# Patient Record
Sex: Female | Born: 1940 | Race: White | Hispanic: No | Marital: Married | Smoking: Former smoker
Health system: Southern US, Community
[De-identification: ages and names within clinical notes are randomized; demographics above are authoritative.]

## PROBLEM LIST (undated history)

## (undated) DIAGNOSIS — E039 Hypothyroidism, unspecified: Secondary | ICD-10-CM

## (undated) DIAGNOSIS — C3490 Malignant neoplasm of unspecified part of unspecified bronchus or lung: Secondary | ICD-10-CM

## (undated) DIAGNOSIS — G4733 Obstructive sleep apnea (adult) (pediatric): Secondary | ICD-10-CM

## (undated) DIAGNOSIS — J449 Chronic obstructive pulmonary disease, unspecified: Secondary | ICD-10-CM

## (undated) DIAGNOSIS — I10 Essential (primary) hypertension: Secondary | ICD-10-CM

## (undated) DIAGNOSIS — I6523 Occlusion and stenosis of bilateral carotid arteries: Secondary | ICD-10-CM

## (undated) DIAGNOSIS — K219 Gastro-esophageal reflux disease without esophagitis: Secondary | ICD-10-CM

## (undated) HISTORY — PX: TOTAL ABDOMINAL HYSTERECTOMY W/ BILATERAL SALPINGOOPHORECTOMY: SHX83

## (undated) HISTORY — PX: APPENDECTOMY: SHX54

## (undated) HISTORY — PX: LUNG LOBECTOMY: SHX167

## (undated) HISTORY — PX: TRANSVERSE LOOP COLOSTOMY: SHX6478

---

## 2010-02-17 ENCOUNTER — Inpatient Hospital Stay (HOSPITAL_COMMUNITY): Admission: AD | Admit: 2010-02-17 | Discharge: 2010-02-19 | Payer: Self-pay | Admitting: Orthopedic Surgery

## 2010-12-05 LAB — DIFFERENTIAL
Eosinophils Absolute: 0.2 10*3/uL (ref 0.0–0.7)
Monocytes Absolute: 0.6 10*3/uL (ref 0.1–1.0)
Monocytes Relative: 8 % (ref 3–12)
Neutrophils Relative %: 62 % (ref 43–77)

## 2010-12-05 LAB — COMPREHENSIVE METABOLIC PANEL
Alkaline Phosphatase: 52 U/L (ref 39–117)
BUN: 26 mg/dL — ABNORMAL HIGH (ref 6–23)
Chloride: 102 mEq/L (ref 96–112)
GFR calc Af Amer: 38 mL/min — ABNORMAL LOW (ref >60)
Potassium: 4.2 mEq/L (ref 3.5–5.1)
Sodium: 140 mEq/L (ref 135–145)
Total Bilirubin: 0.6 mg/dL (ref 0.3–1.2)
Total Protein: 6.1 g/dL (ref 6.0–8.3)

## 2010-12-05 LAB — CBC
MCHC: 33.8 g/dL (ref 30.0–36.0)
RDW: 12.9 % (ref 11.5–15.5)

## 2016-04-04 HISTORY — PX: COLOSTOMY TAKEDOWN: SHX5783

## 2016-04-09 ENCOUNTER — Inpatient Hospital Stay (HOSPITAL_COMMUNITY)
Admission: EM | Admit: 2016-04-09 | Discharge: 2016-05-19 | DRG: 003 | Disposition: E | Payer: Medicare Other | Source: Other Acute Inpatient Hospital | Attending: Internal Medicine | Admitting: Internal Medicine

## 2016-04-09 ENCOUNTER — Inpatient Hospital Stay (HOSPITAL_COMMUNITY): Payer: Medicare Other

## 2016-04-09 ENCOUNTER — Encounter (HOSPITAL_COMMUNITY): Payer: Self-pay | Admitting: Acute Care

## 2016-04-09 DIAGNOSIS — J189 Pneumonia, unspecified organism: Secondary | ICD-10-CM

## 2016-04-09 DIAGNOSIS — J440 Chronic obstructive pulmonary disease with acute lower respiratory infection: Secondary | ICD-10-CM | POA: Diagnosis present

## 2016-04-09 DIAGNOSIS — Z6823 Body mass index (BMI) 23.0-23.9, adult: Secondary | ICD-10-CM

## 2016-04-09 DIAGNOSIS — I639 Cerebral infarction, unspecified: Secondary | ICD-10-CM | POA: Diagnosis not present

## 2016-04-09 DIAGNOSIS — I6300 Cerebral infarction due to thrombosis of unspecified precerebral artery: Secondary | ICD-10-CM

## 2016-04-09 DIAGNOSIS — Z902 Acquired absence of lung [part of]: Secondary | ICD-10-CM

## 2016-04-09 DIAGNOSIS — K567 Ileus, unspecified: Secondary | ICD-10-CM | POA: Diagnosis present

## 2016-04-09 DIAGNOSIS — N170 Acute kidney failure with tubular necrosis: Secondary | ICD-10-CM | POA: Diagnosis not present

## 2016-04-09 DIAGNOSIS — D65 Disseminated intravascular coagulation [defibrination syndrome]: Secondary | ICD-10-CM | POA: Diagnosis not present

## 2016-04-09 DIAGNOSIS — R6521 Severe sepsis with septic shock: Secondary | ICD-10-CM | POA: Diagnosis present

## 2016-04-09 DIAGNOSIS — R531 Weakness: Secondary | ICD-10-CM | POA: Diagnosis not present

## 2016-04-09 DIAGNOSIS — J9600 Acute respiratory failure, unspecified whether with hypoxia or hypercapnia: Secondary | ICD-10-CM

## 2016-04-09 DIAGNOSIS — Z9981 Dependence on supplemental oxygen: Secondary | ICD-10-CM

## 2016-04-09 DIAGNOSIS — I6523 Occlusion and stenosis of bilateral carotid arteries: Secondary | ICD-10-CM | POA: Diagnosis not present

## 2016-04-09 DIAGNOSIS — R571 Hypovolemic shock: Secondary | ICD-10-CM | POA: Diagnosis present

## 2016-04-09 DIAGNOSIS — G9341 Metabolic encephalopathy: Secondary | ICD-10-CM | POA: Diagnosis present

## 2016-04-09 DIAGNOSIS — D72829 Elevated white blood cell count, unspecified: Secondary | ICD-10-CM | POA: Diagnosis not present

## 2016-04-09 DIAGNOSIS — J9690 Respiratory failure, unspecified, unspecified whether with hypoxia or hypercapnia: Secondary | ICD-10-CM

## 2016-04-09 DIAGNOSIS — T814XXA Infection following a procedure, initial encounter: Secondary | ICD-10-CM | POA: Diagnosis not present

## 2016-04-09 DIAGNOSIS — A419 Sepsis, unspecified organism: Principal | ICD-10-CM | POA: Diagnosis present

## 2016-04-09 DIAGNOSIS — I480 Paroxysmal atrial fibrillation: Secondary | ICD-10-CM | POA: Diagnosis present

## 2016-04-09 DIAGNOSIS — K219 Gastro-esophageal reflux disease without esophagitis: Secondary | ICD-10-CM | POA: Diagnosis present

## 2016-04-09 DIAGNOSIS — E43 Unspecified severe protein-calorie malnutrition: Secondary | ICD-10-CM | POA: Diagnosis present

## 2016-04-09 DIAGNOSIS — Z66 Do not resuscitate: Secondary | ICD-10-CM | POA: Diagnosis not present

## 2016-04-09 DIAGNOSIS — R7989 Other specified abnormal findings of blood chemistry: Secondary | ICD-10-CM

## 2016-04-09 DIAGNOSIS — K7200 Acute and subacute hepatic failure without coma: Secondary | ICD-10-CM | POA: Diagnosis not present

## 2016-04-09 DIAGNOSIS — Y836 Removal of other organ (partial) (total) as the cause of abnormal reaction of the patient, or of later complication, without mention of misadventure at the time of the procedure: Secondary | ICD-10-CM | POA: Diagnosis not present

## 2016-04-09 DIAGNOSIS — T380X5A Adverse effect of glucocorticoids and synthetic analogues, initial encounter: Secondary | ICD-10-CM | POA: Diagnosis not present

## 2016-04-09 DIAGNOSIS — J9601 Acute respiratory failure with hypoxia: Secondary | ICD-10-CM | POA: Diagnosis not present

## 2016-04-09 DIAGNOSIS — E878 Other disorders of electrolyte and fluid balance, not elsewhere classified: Secondary | ICD-10-CM | POA: Diagnosis not present

## 2016-04-09 DIAGNOSIS — D6959 Other secondary thrombocytopenia: Secondary | ICD-10-CM | POA: Diagnosis not present

## 2016-04-09 DIAGNOSIS — E872 Acidosis: Secondary | ICD-10-CM | POA: Diagnosis present

## 2016-04-09 DIAGNOSIS — B370 Candidal stomatitis: Secondary | ICD-10-CM | POA: Diagnosis not present

## 2016-04-09 DIAGNOSIS — G9340 Encephalopathy, unspecified: Secondary | ICD-10-CM | POA: Diagnosis present

## 2016-04-09 DIAGNOSIS — Y95 Nosocomial condition: Secondary | ICD-10-CM | POA: Diagnosis present

## 2016-04-09 DIAGNOSIS — D62 Acute posthemorrhagic anemia: Secondary | ICD-10-CM | POA: Diagnosis not present

## 2016-04-09 DIAGNOSIS — R1310 Dysphagia, unspecified: Secondary | ICD-10-CM | POA: Diagnosis present

## 2016-04-09 DIAGNOSIS — Z85118 Personal history of other malignant neoplasm of bronchus and lung: Secondary | ICD-10-CM

## 2016-04-09 DIAGNOSIS — E039 Hypothyroidism, unspecified: Secondary | ICD-10-CM | POA: Diagnosis present

## 2016-04-09 DIAGNOSIS — K9189 Other postprocedural complications and disorders of digestive system: Secondary | ICD-10-CM

## 2016-04-09 DIAGNOSIS — I4891 Unspecified atrial fibrillation: Secondary | ICD-10-CM | POA: Diagnosis not present

## 2016-04-09 DIAGNOSIS — I10 Essential (primary) hypertension: Secondary | ICD-10-CM | POA: Diagnosis present

## 2016-04-09 DIAGNOSIS — R1013 Epigastric pain: Secondary | ICD-10-CM | POA: Diagnosis not present

## 2016-04-09 DIAGNOSIS — Z452 Encounter for adjustment and management of vascular access device: Secondary | ICD-10-CM

## 2016-04-09 DIAGNOSIS — K55029 Acute infarction of small intestine, extent unspecified: Secondary | ICD-10-CM | POA: Diagnosis not present

## 2016-04-09 DIAGNOSIS — R4182 Altered mental status, unspecified: Secondary | ICD-10-CM | POA: Diagnosis present

## 2016-04-09 DIAGNOSIS — I6340 Cerebral infarction due to embolism of unspecified cerebral artery: Secondary | ICD-10-CM | POA: Diagnosis present

## 2016-04-09 DIAGNOSIS — IMO0002 Reserved for concepts with insufficient information to code with codable children: Secondary | ICD-10-CM

## 2016-04-09 DIAGNOSIS — Z515 Encounter for palliative care: Secondary | ICD-10-CM | POA: Diagnosis not present

## 2016-04-09 DIAGNOSIS — Z419 Encounter for procedure for purposes other than remedying health state, unspecified: Secondary | ICD-10-CM

## 2016-04-09 DIAGNOSIS — K559 Vascular disorder of intestine, unspecified: Secondary | ICD-10-CM | POA: Diagnosis present

## 2016-04-09 DIAGNOSIS — R945 Abnormal results of liver function studies: Secondary | ICD-10-CM

## 2016-04-09 DIAGNOSIS — I6529 Occlusion and stenosis of unspecified carotid artery: Secondary | ICD-10-CM | POA: Diagnosis present

## 2016-04-09 DIAGNOSIS — Z8673 Personal history of transient ischemic attack (TIA), and cerebral infarction without residual deficits: Secondary | ICD-10-CM

## 2016-04-09 DIAGNOSIS — E162 Hypoglycemia, unspecified: Secondary | ICD-10-CM | POA: Diagnosis not present

## 2016-04-09 DIAGNOSIS — J15212 Pneumonia due to Methicillin resistant Staphylococcus aureus: Secondary | ICD-10-CM | POA: Diagnosis present

## 2016-04-09 DIAGNOSIS — R000 Tachycardia, unspecified: Secondary | ICD-10-CM | POA: Diagnosis present

## 2016-04-09 DIAGNOSIS — Z79899 Other long term (current) drug therapy: Secondary | ICD-10-CM

## 2016-04-09 DIAGNOSIS — R109 Unspecified abdominal pain: Secondary | ICD-10-CM

## 2016-04-09 DIAGNOSIS — I248 Other forms of acute ischemic heart disease: Secondary | ICD-10-CM | POA: Diagnosis not present

## 2016-04-09 DIAGNOSIS — I69391 Dysphagia following cerebral infarction: Secondary | ICD-10-CM

## 2016-04-09 DIAGNOSIS — J9602 Acute respiratory failure with hypercapnia: Secondary | ICD-10-CM | POA: Diagnosis not present

## 2016-04-09 DIAGNOSIS — R17 Unspecified jaundice: Secondary | ICD-10-CM

## 2016-04-09 DIAGNOSIS — I63133 Cerebral infarction due to embolism of bilateral carotid arteries: Secondary | ICD-10-CM | POA: Diagnosis present

## 2016-04-09 DIAGNOSIS — E876 Hypokalemia: Secondary | ICD-10-CM | POA: Diagnosis not present

## 2016-04-09 DIAGNOSIS — J90 Pleural effusion, not elsewhere classified: Secondary | ICD-10-CM | POA: Diagnosis present

## 2016-04-09 DIAGNOSIS — K660 Peritoneal adhesions (postprocedural) (postinfection): Secondary | ICD-10-CM | POA: Diagnosis present

## 2016-04-09 DIAGNOSIS — J9620 Acute and chronic respiratory failure, unspecified whether with hypoxia or hypercapnia: Secondary | ICD-10-CM | POA: Diagnosis not present

## 2016-04-09 DIAGNOSIS — Z7982 Long term (current) use of aspirin: Secondary | ICD-10-CM

## 2016-04-09 DIAGNOSIS — K913 Postprocedural intestinal obstruction: Secondary | ICD-10-CM | POA: Diagnosis not present

## 2016-04-09 DIAGNOSIS — G4733 Obstructive sleep apnea (adult) (pediatric): Secondary | ICD-10-CM | POA: Diagnosis present

## 2016-04-09 DIAGNOSIS — K55059 Acute (reversible) ischemia of intestine, part and extent unspecified: Secondary | ICD-10-CM | POA: Diagnosis not present

## 2016-04-09 DIAGNOSIS — D6489 Other specified anemias: Secondary | ICD-10-CM | POA: Diagnosis present

## 2016-04-09 DIAGNOSIS — J449 Chronic obstructive pulmonary disease, unspecified: Secondary | ICD-10-CM | POA: Diagnosis not present

## 2016-04-09 DIAGNOSIS — J441 Chronic obstructive pulmonary disease with (acute) exacerbation: Secondary | ICD-10-CM | POA: Diagnosis present

## 2016-04-09 DIAGNOSIS — R627 Adult failure to thrive: Secondary | ICD-10-CM | POA: Diagnosis present

## 2016-04-09 DIAGNOSIS — Z87891 Personal history of nicotine dependence: Secondary | ICD-10-CM

## 2016-04-09 HISTORY — DX: Malignant neoplasm of unspecified part of unspecified bronchus or lung: C34.90

## 2016-04-09 HISTORY — DX: Essential (primary) hypertension: I10

## 2016-04-09 HISTORY — DX: Hypothyroidism, unspecified: E03.9

## 2016-04-09 HISTORY — DX: Chronic obstructive pulmonary disease, unspecified: J44.9

## 2016-04-09 HISTORY — DX: Gastro-esophageal reflux disease without esophagitis: K21.9

## 2016-04-09 HISTORY — DX: Obstructive sleep apnea (adult) (pediatric): G47.33

## 2016-04-09 HISTORY — DX: Occlusion and stenosis of bilateral carotid arteries: I65.23

## 2016-04-09 LAB — CBC WITH DIFFERENTIAL/PLATELET
BAND NEUTROPHILS: 23 %
BASOS PCT: 0 %
BLASTS: 0 %
Basophils Absolute: 0 10*3/uL (ref 0.0–0.1)
EOS ABS: 0.2 10*3/uL (ref 0.0–0.7)
Eosinophils Relative: 1 %
HCT: 31.2 % — ABNORMAL LOW (ref 36.0–46.0)
HEMOGLOBIN: 10.2 g/dL — AB (ref 12.0–15.0)
LYMPHS PCT: 4 %
Lymphs Abs: 0.9 10*3/uL (ref 0.7–4.0)
MCH: 29.5 pg (ref 26.0–34.0)
MCHC: 32.7 g/dL (ref 30.0–36.0)
MCV: 90.2 fL (ref 78.0–100.0)
MONOS PCT: 5 %
Metamyelocytes Relative: 3 %
Monocytes Absolute: 1.1 10*3/uL — ABNORMAL HIGH (ref 0.1–1.0)
Myelocytes: 0 %
NEUTROS ABS: 20.1 10*3/uL — AB (ref 1.7–7.7)
NRBC: 0 /100{WBCs}
Neutrophils Relative %: 62 %
OTHER: 0 %
Platelets: 86 10*3/uL — ABNORMAL LOW (ref 150–400)
Promyelocytes Absolute: 2 %
RBC: 3.46 MIL/uL — ABNORMAL LOW (ref 3.87–5.11)
RDW: 14.9 % (ref 11.5–15.5)
WBC Morphology: INCREASED
WBC: 22.3 10*3/uL — ABNORMAL HIGH (ref 4.0–10.5)

## 2016-04-09 LAB — POCT I-STAT 3, ART BLOOD GAS (G3+)
Acid-base deficit: 5 mmol/L — ABNORMAL HIGH (ref 0.0–2.0)
Bicarbonate: 18.3 mEq/L — ABNORMAL LOW (ref 20.0–24.0)
O2 Saturation: 94 %
PCO2 ART: 27.5 mmHg — AB (ref 35.0–45.0)
PH ART: 7.428 (ref 7.350–7.450)
TCO2: 19 mmol/L (ref 0–100)
pO2, Arterial: 64 mmHg — ABNORMAL LOW (ref 80.0–100.0)

## 2016-04-09 LAB — PROTIME-INR
INR: 1.12 (ref 0.00–1.49)
PROTHROMBIN TIME: 14.6 s (ref 11.6–15.2)

## 2016-04-09 LAB — COMPREHENSIVE METABOLIC PANEL
ALBUMIN: 2 g/dL — AB (ref 3.5–5.0)
ALK PHOS: 96 U/L (ref 38–126)
ALT: 36 U/L (ref 14–54)
AST: 50 U/L — AB (ref 15–41)
Anion gap: 6 (ref 5–15)
BILIRUBIN TOTAL: 1.3 mg/dL — AB (ref 0.3–1.2)
BUN: 17 mg/dL (ref 6–20)
CALCIUM: 7.9 mg/dL — AB (ref 8.9–10.3)
CO2: 22 mmol/L (ref 22–32)
Chloride: 111 mmol/L (ref 101–111)
Creatinine, Ser: 1.01 mg/dL — ABNORMAL HIGH (ref 0.44–1.00)
GFR calc Af Amer: >60 mL/min (ref >60)
GFR calc non Af Amer: 52 mL/min — ABNORMAL LOW (ref >60)
GLUCOSE: 95 mg/dL (ref 65–99)
Potassium: 4 mmol/L (ref 3.5–5.1)
Sodium: 139 mmol/L (ref 135–145)
TOTAL PROTEIN: 4.4 g/dL — AB (ref 6.5–8.1)

## 2016-04-09 LAB — GLUCOSE, CAPILLARY
GLUCOSE-CAPILLARY: 104 mg/dL — AB (ref 65–99)
Glucose-Capillary: 76 mg/dL (ref 65–99)

## 2016-04-09 LAB — MAGNESIUM: Magnesium: 1.7 mg/dL (ref 1.7–2.4)

## 2016-04-09 LAB — TROPONIN I
TROPONIN I: 0.63 ng/mL — AB (ref <0.03)
Troponin I: 1.95 ng/mL (ref <0.03)

## 2016-04-09 LAB — PHOSPHORUS: Phosphorus: 1.3 mg/dL — ABNORMAL LOW (ref 2.5–4.6)

## 2016-04-09 LAB — LACTIC ACID, PLASMA
LACTIC ACID, VENOUS: 2.3 mmol/L — AB (ref 0.5–1.9)
LACTIC ACID, VENOUS: 1.6 mmol/L (ref 0.5–1.9)

## 2016-04-09 LAB — MRSA PCR SCREENING: MRSA BY PCR: POSITIVE — AB

## 2016-04-09 LAB — TSH: TSH: 28.195 u[IU]/mL — ABNORMAL HIGH (ref 0.350–4.500)

## 2016-04-09 LAB — CK: Total CK: 47 U/L (ref 38–234)

## 2016-04-09 LAB — PROCALCITONIN: Procalcitonin: 0.95 ng/mL

## 2016-04-09 MED ORDER — FENTANYL CITRATE (PF) 100 MCG/2ML IJ SOLN
50.0000 ug | INTRAMUSCULAR | Status: DC | PRN
  Administered 2016-04-10: 50 ug via INTRAVENOUS
  Filled 2016-04-09 (×2): qty 2

## 2016-04-09 MED ORDER — CHLORHEXIDINE GLUCONATE CLOTH 2 % EX PADS
6.0000 | MEDICATED_PAD | Freq: Every day | CUTANEOUS | Status: DC
  Administered 2016-04-10 – 2016-04-11 (×2): 6 via TOPICAL

## 2016-04-09 MED ORDER — VANCOMYCIN HCL IN DEXTROSE 1-5 GM/200ML-% IV SOLN
1000.0000 mg | Freq: Once | INTRAVENOUS | Status: DC
  Filled 2016-04-09: qty 200

## 2016-04-09 MED ORDER — METHYLPREDNISOLONE SODIUM SUCC 125 MG IJ SOLR
40.0000 mg | Freq: Two times a day (BID) | INTRAMUSCULAR | Status: DC
  Administered 2016-04-09 – 2016-04-11 (×4): 40 mg via INTRAVENOUS
  Filled 2016-04-09 (×4): qty 2

## 2016-04-09 MED ORDER — HEPARIN SODIUM (PORCINE) 5000 UNIT/ML IJ SOLN
5000.0000 [IU] | Freq: Three times a day (TID) | INTRAMUSCULAR | Status: DC
  Administered 2016-04-09 – 2016-04-11 (×7): 5000 [IU] via SUBCUTANEOUS
  Filled 2016-04-09 (×7): qty 1

## 2016-04-09 MED ORDER — MEROPENEM 1 G IV SOLR
1.0000 g | Freq: Once | INTRAVENOUS | Status: AC
  Administered 2016-04-09: 1 g via INTRAVENOUS
  Filled 2016-04-09: qty 1

## 2016-04-09 MED ORDER — INSULIN ASPART 100 UNIT/ML ~~LOC~~ SOLN
0.0000 [IU] | SUBCUTANEOUS | Status: DC
  Administered 2016-04-10: 1 [IU] via SUBCUTANEOUS

## 2016-04-09 MED ORDER — ANTISEPTIC ORAL RINSE SOLUTION (CORINZ)
7.0000 mL | Freq: Four times a day (QID) | OROMUCOSAL | Status: DC
  Administered 2016-04-09 – 2016-04-12 (×11): 7 mL via OROMUCOSAL

## 2016-04-09 MED ORDER — METOPROLOL TARTRATE 5 MG/5ML IV SOLN
2.5000 mg | INTRAVENOUS | Status: DC
  Administered 2016-04-09 – 2016-04-10 (×4): 2.5 mg via INTRAVENOUS
  Filled 2016-04-09 (×5): qty 5

## 2016-04-09 MED ORDER — SODIUM CHLORIDE 0.9% FLUSH: 10.0000 mL | INTRAVENOUS | Status: DC | PRN

## 2016-04-09 MED ORDER — CHLORHEXIDINE GLUCONATE 0.12% ORAL RINSE (MEDLINE KIT)
15.0000 mL | Freq: Two times a day (BID) | OROMUCOSAL | Status: DC
  Administered 2016-04-09 – 2016-04-11 (×4): 15 mL via OROMUCOSAL

## 2016-04-09 MED ORDER — DEXTROSE-NACL 5-0.45 % IV SOLN
INTRAVENOUS | Status: DC
  Administered 2016-04-10: 02:00:00 via INTRAVENOUS

## 2016-04-09 MED ORDER — IPRATROPIUM-ALBUTEROL 0.5-2.5 (3) MG/3ML IN SOLN
3.0000 mL | Freq: Four times a day (QID) | RESPIRATORY_TRACT | Status: DC
  Administered 2016-04-09 – 2016-04-13 (×15): 3 mL via RESPIRATORY_TRACT
  Filled 2016-04-09 (×17): qty 3

## 2016-04-09 MED ORDER — BUDESONIDE 0.5 MG/2ML IN SUSP
0.5000 mg | Freq: Two times a day (BID) | RESPIRATORY_TRACT | Status: DC
  Administered 2016-04-09 – 2016-04-29 (×39): 0.5 mg via RESPIRATORY_TRACT
  Filled 2016-04-09 (×40): qty 2

## 2016-04-09 MED ORDER — SODIUM CHLORIDE 0.9 % IV SOLN
1.0000 g | Freq: Two times a day (BID) | INTRAVENOUS | Status: DC
  Administered 2016-04-10 – 2016-04-12 (×5): 1 g via INTRAVENOUS
  Filled 2016-04-09 (×6): qty 1

## 2016-04-09 MED ORDER — FAMOTIDINE IN NACL 20-0.9 MG/50ML-% IV SOLN
20.0000 mg | Freq: Two times a day (BID) | INTRAVENOUS | Status: DC
  Administered 2016-04-09 – 2016-04-12 (×6): 20 mg via INTRAVENOUS
  Filled 2016-04-09 (×7): qty 50

## 2016-04-09 MED ORDER — SODIUM CHLORIDE 0.9% FLUSH
10.0000 mL | Freq: Two times a day (BID) | INTRAVENOUS | Status: DC
  Administered 2016-04-09 – 2016-04-14 (×9): 10 mL
  Administered 2016-04-14 – 2016-04-15 (×2): 40 mL
  Administered 2016-04-16: 30 mL

## 2016-04-09 MED ORDER — MUPIROCIN 2 % EX OINT
1.0000 "application " | TOPICAL_OINTMENT | Freq: Two times a day (BID) | CUTANEOUS | Status: AC
  Administered 2016-04-09 – 2016-04-14 (×10): 1 via NASAL
  Filled 2016-04-09 (×2): qty 22

## 2016-04-09 MED ORDER — DILTIAZEM HCL 100 MG IV SOLR
5.0000 mg/h | INTRAVENOUS | Status: DC
  Administered 2016-04-09: 5 mg/h via INTRAVENOUS
  Administered 2016-04-09 – 2016-04-10 (×2): 15 mg/h via INTRAVENOUS
  Administered 2016-04-10 – 2016-04-11 (×3): 10 mg/h via INTRAVENOUS
  Filled 2016-04-09 (×6): qty 100

## 2016-04-09 MED ORDER — FENTANYL CITRATE (PF) 100 MCG/2ML IJ SOLN
50.0000 ug | INTRAMUSCULAR | Status: DC | PRN
  Administered 2016-04-10: 50 ug via INTRAVENOUS

## 2016-04-09 MED ORDER — ASPIRIN 81 MG PO CHEW: 324.0000 mg | CHEWABLE_TABLET | ORAL | Status: AC

## 2016-04-09 MED ORDER — DILTIAZEM LOAD VIA INFUSION
10.0000 mg | Freq: Once | INTRAVENOUS | Status: AC
  Administered 2016-04-09: 10 mg via INTRAVENOUS
  Filled 2016-04-09: qty 10

## 2016-04-09 MED ORDER — ASPIRIN 300 MG RE SUPP: 300.0000 mg | RECTAL | Status: AC

## 2016-04-09 MED ORDER — VANCOMYCIN HCL 500 MG IV SOLR
500.0000 mg | Freq: Two times a day (BID) | INTRAVENOUS | Status: DC
  Administered 2016-04-10 – 2016-04-12 (×6): 500 mg via INTRAVENOUS
  Filled 2016-04-09 (×8): qty 500

## 2016-04-09 MED ORDER — LEVOTHYROXINE SODIUM 100 MCG IV SOLR
44.0000 ug | Freq: Every day | INTRAVENOUS | Status: DC
  Administered 2016-04-10 – 2016-04-12 (×3): 44 ug via INTRAVENOUS
  Filled 2016-04-09 (×4): qty 5

## 2016-04-09 MED ORDER — VANCOMYCIN HCL 10 G IV SOLR
1250.0000 mg | Freq: Once | INTRAVENOUS | Status: AC
  Administered 2016-04-09: 1250 mg via INTRAVENOUS
  Filled 2016-04-09: qty 1250

## 2016-04-09 NOTE — Progress Notes (Signed)
eLink Physician-Brief Progress Note Patient Name: Kennisha Qin DOB: [birth year] MRN: [id number]   Date of Service  03/22/2016  HPI/Events of Note  Rapid af not resp to lopressor   Intake/Output Summary (Last 24 hours) at 04/04/2016 1759 Last data filed at 04/12/2016 1700  Gross per 24 hour  Intake              250 ml  Output             1055 ml  Net             -805 ml     eICU Interventions  cardizem drip ordered     Intervention Category Major Interventions: Arrhythmia - evaluation and management  Christinia Gully 04/14/2016, 5:59 PM

## 2016-04-09 NOTE — H&P (Signed)
PULMONARY / CRITICAL CARE MEDICINE   Name: Amy [name] MRN: [id number] DOB: 1941/01/21    ADMISSION DATE:  04/06/2016 CONSULTATION DATE:  7/23  REFERRING MD:  [facility] hospital per family request   CHIEF COMPLAINT:  Acute encephalopathy and progressive respiratory failure   HISTORY OF PRESENT ILLNESS:   This is a 75 year old white female who was initially admitted to [facility] hospital where she underwent an elective loop colostomy takedown on 7/18. Her immediate post-op course was c/b lethargy and actually required narcan in PACU. She never really improved w/ hospital notes continuing to raise concern for altered mental status and difficulty to arouse. She ultimately required emergent intubation for hypercarbia (CO2 52) and inability to protect airway. She was transferred per family request on 7/23 for further evaluation and care.   PAST MEDICAL HISTORY :  She  has a past medical history of Bilateral carotid artery stenosis; COPD (chronic obstructive pulmonary disease) ([place]); GERD (gastroesophageal reflux disease); HTN (hypertension); Hypothyroidism; Non-small cell lung cancer ([place]); and OSA (obstructive sleep apnea).  PAST SURGICAL HISTORY: She  has a past surgical history that includes Appendectomy; Total abdominal hysterectomy w/ bilateral salpingoophorectomy; Lung lobectomy (Right); Colostomy takedown (04/04/2016); and Transverse loop colostomy.  Allergies  Allergen Reactions  . Ace Inhibitors Cough  . Codeine Nausea Only  . Morphine And Related   . Penicillins Itching  . Prednisone Other (See Comments)    Head aches   . Spiriva Handihaler [Tiotropium Bromide Monohydrate] Other (See Comments)    Urinary retention     No current facility-administered medications on file prior to encounter.    No current outpatient prescriptions on file prior to encounter.    FAMILY HISTORY:  Her family has h/o cardiac disease (both sides); also brother w/ prostate CA copd   SOCIAL  HISTORY: She   is marries. Stopped smoking about 20 yrs ago  REVIEW OF SYSTEMS:   Unable   SUBJECTIVE:  Sedated on vent   VITAL SIGNS: BP (!) 133/59   Pulse 62   Temp 97.5 F (36.4 C) (Oral)   Resp (!) 24   Ht '5\' 6"'$  (1.676 m)   Wt 141 lb 5 oz (64.1 kg)   SpO2 98%   BMI 22.81 kg/m   HEMODYNAMICS:    VENTILATOR SETTINGS: Vent Mode: PRVC FiO2 (%):  [50 %] 50 % Set Rate:  [20 bmp] 20 bmp Vt Set:  [450 mL] 450 mL PEEP:  [5 cmH20] 5 cmH20 Plateau Pressure:  [28 cmH20] 28 cmH20  INTAKE / OUTPUT: No intake/output data recorded.  PHYSICAL EXAMINATION: General:  Elderly 75 year old female. Lethargic, opens eyes to voice, no spont purposeful movement.  Neuro:  Opens eyes to verbal stimuli only, does not follow commands. [place] localizes w the right but seems to neglect the left and is weaker on the left  HEENT:  MMM, orally intubated  Cardiovascular:  Tachy irreg irreg  Lungs:  Diffuse rhonchi Decreased on right  Abdomen:  Soft, tender to palp. Staples intact, LLQ dressing intact  Musculoskeletal:  Good strength on right. Decreased on left.  Skin:  Warm and dry   LABS:  BMET No results for input(s): NA, K, CL, CO2, BUN, CREATININE, GLUCOSE in the last 168 hours.  Electrolytes No results for input(s): CALCIUM, MG, PHOS in the last 168 hours.  CBC No results for input(s): WBC, HGB, HCT, PLT in the last 168 hours.  Coag's No results for input(s): APTT, INR in the last 168 hours.  Sepsis  Markers No results for input(s): LATICACIDVEN, PROCALCITON, O2SATVEN in the last 168 hours.  ABG  Recent Labs Lab 03/25/2016 1432  PHART 7.428  PCO2ART 27.5*  PO2ART 64.0*    Liver Enzymes No results for input(s): AST, ALT, ALKPHOS, BILITOT, ALBUMIN in the last 168 hours.  Cardiac Enzymes No results for input(s): TROPONINI, PROBNP in the last 168 hours.  Glucose  Recent Labs Lab 04/14/2016 1432  GLUCAP 104*    Imaging Portable Chest Xray  Result Date:  03/23/2016 CLINICAL DATA:  Patient with acute respiratory failure. EXAM: PORTABLE CHEST 1 VIEW COMPARISON:  Chest radiograph 03/29/2016. FINDINGS: ET tube terminates in the mid trachea. Right IJ central venous catheter tip projects over the superior vena cava. Enteric tube courses inferior to the diaphragm. Stable cardiac and mediastinal contours. Rightward shift of mediastinum. Moderate right pleural effusion with pulmonary consolidation within the right mid and lower hemi thorax. Left lung is clear. Monitoring leads overlie the patient. IMPRESSION: Persistent moderate right pleural effusion and underlying opacities favored to represent atelectasis. ET tube terminates in mid trachea. Electronically Signed   By: Lovey Newcomer M.D.   On: 03/20/2016 15:19    STUDIES:  [facility] head (at [facility] negative  MR brain  7/23>>> EEG 7/23>>>  CULTURES: Sputum 7/23>> UC 7/23>>> BC 7/23>>  ANTIBIOTICS: vanc 7/23>>> meropenem 7/23>>>  SIGNIFICANT EVENTS: Loop colostomy takedown 7/18 7/18-7/22: progressively lethargic  7/23 transferred to [facility] s/p getting intubated for AMS and hypercarbia. Surgical service consulted at [facility] on arrival   LINES/TUBES: OETT 7/23>>>  DISCUSSION: 75 year old white female who was initially admitted to [facility] hospital where she underwent an elective loop colostomy takedown on 7/18. Her immediate post-op course was c/b lethargy and actually required narcan in PACU. She never really improved w/ hospital notes continuing to raise concern for altered mental status and difficulty to arouse. She ultimately required emergent intubation for hypercarbia (CO2 52) and inability to protect airway. She was transferred per family request on 7/23 for further evaluation and care. The etiology of her encephalopathy is unclear. We will obtain admission labs, have ordered MRI head and also EEG. Have also asked Neuro to see. Surgical services have been consulted to a/w wound care and nutritional  needs.     ASSESSMENT / PLAN:  PULMONARY A: Acute Hypercarbic respiratory failure (PCO2 52) H/o COPD w/ probable AECOPD H/o OSA (intolerant of CPAP_ H/o NSCLC w/ RUL lobectomy --> per records in remission  P:   Full vent support PAD protocol  No benzos Scheduled BDs Scheduled systemic steroids  Daily assessment for weaning   CARDIOVASCULAR A:  AF HTN  H/o bilateral carotid artery stenosis  P:  Scheduled lopressor  Get 12 lead  Tele monitoring  Will hold off from anticoagulation given bleed risk   RENAL A:   Metabolic acidosis   P:   Ck lactic acid  Ck chemistry   GASTROINTESTINAL A:   Post-op day 5 colostomy take-down (w/ remote h/o diverticular disease) Post-op ileus  P:   NPO Surgery following-->will decide on tubefeeds vs TNA next day or so Pepcid for SUP  HEMATOLOGIC A:   Leukocytosis  Anemia of critical illness P:  Last hgb on record from OSH is 9.5-->will repeat CBC [place] heparin    INFECTIOUS A:   R/o sepsis P:   Pan culture Will cont meropenem and vanc for now as initiated at [facility]   ENDOCRINE A:   Hypothyroidism  P:   Synthroid replacement  SSI protocol   NEUROLOGIC  A:   Acute Encephalopathy: etiology unclear. She does have some weakness from a neuro-standpoint focally on the left. Could all be residual sedation, at this point hypercarbia unlikely as should have improved by now, her initial CT head was negative but need to ensure she has not had an acute neurological insult. Also r/o sepsis  P:   RASS goal: 0 to -1 PAD protocol EEG Get MRI brain  May need   FAMILY  - Updates: pending   - Inter-disciplinary family meet or Palliative Care meeting due by: [place] ACNP-BC [place] Pager # [phone] OR # [phone] if no answer   03/25/2016, 3:34 PM

## 2016-04-09 NOTE — Consult Note (Signed)
Reason for Consult: post-op care Referring Physician: Clista Rainford [name] is an 75 y.o. female.  HPI: Amy [name] is 5 days status post colostomy takedown by Dr. Flonnie Overman at [facility] in [place], [place]. She developed worsening respiratory failure requiring intubation. She was accepted in transfer to [facility] by Dr. Elsworth Soho from the critical care medicine service. By report, she had not been started on a diet yet as bowel function had not returned. She was on TNA there. She has not been requiring any pressors. On arrival, she is intubated so she cannot contribute to the history. It does appear she is in atrial fibrillation with heart rate in the 90s at this time.  No past medical history on file.  No past surgical history on file.  No family history on file.  Social History:  has no tobacco, alcohol, and drug history on file.  Allergies: Allergies not on file  Medications:  Prior to Admission:  No prescriptions prior to admission.    No results found for this or any previous visit (from the past 48 hour(s)).  No results found.  Review of Systems  Unable to perform ROS: Intubated   SpO2 93 %. Physical Exam  Constitutional: She appears well-developed and well-nourished. She appears lethargic. No distress.  HENT:  Head: Normocephalic.  Right Ear: External ear normal.  Left Ear: External ear normal.  Nose: Nose normal.  Mouth/Throat: Oropharynx is clear and moist.  Eyes: Conjunctivae are normal. Pupils are equal, round, and reactive to light. Right eye exhibits no discharge. Left eye exhibits no discharge.  Neck: Neck supple. No tracheal deviation present.  Cardiovascular:  Irregularly irregular rhythm, 90s, distal pulses palpable  Respiratory: Effort normal. No stridor. No respiratory distress. She has no wheezes. She has no rales.  Mild bilateral rhonchi, on ventilator  GI: Soft. She exhibits distension. There is tenderness. There is no rebound and no  guarding.  Soft, mild distention, quiet, anticipated tenderness along incisions, midline incision clean dry and intact, left previous ostomy site incision clean and dry with a wick in place, no cellulitis  Musculoskeletal: She exhibits no tenderness or deformity.  Neurological: She appears lethargic. She displays no atrophy and no tremor. She exhibits normal muscle tone. She displays no seizure activity.  Arouses somewhat but does not follow commands  Skin: Skin is warm.    Assessment/Plan: POD#5 status post colostomy takedown at outside facility as above - await bowel function return prior to starting tube feeds. Leave midline incision open to air. Small plain gauze dressing to previous colostomy site. Plan to remove wick tomorrow. We will follow.  Ventilator dependent respiratory failure and atrial fibrillation - per CCM  Kym Scannell E 03/26/2016, 2:16 PM

## 2016-04-09 NOTE — Consult Note (Signed)
NEURO HOSPITALIST CONSULT NOTE   Requestig physician: Dr. Elsworth Soho   Reason for Consult:Mental status changes   History obtained from:  Chart  HPI:                                                                                                                                          Amy [name] is an 75 y.o. female who was transferred from outside facility due to respiratory failure and mental status changes.  She is s/p 5 days ago of colostomy reversal.  CXR shows right lung consolidation and pleural effusion.  WBC is 22K.  CT Brain was reviewed personally and is normal.  CMP is pending.  Past Medical History:  Diagnosis Date  . Bilateral carotid artery stenosis   . COPD (chronic obstructive pulmonary disease) ([place])   . GERD (gastroesophageal reflux disease)   . HTN (hypertension)   . Hypothyroidism   . Non-small cell lung cancer ([place])   . OSA (obstructive sleep apnea)    intolerant of CPAP    Past Surgical History:  Procedure Laterality Date  . APPENDECTOMY     at age 64  . COLOSTOMY TAKEDOWN  04/04/2016  . LUNG LOBECTOMY Right    RUL 1997 NSCLC  . TOTAL ABDOMINAL HYSTERECTOMY W/ BILATERAL SALPINGOOPHORECTOMY    . TRANSVERSE LOOP COLOSTOMY     w/ drainage of abscess for perf diverticular disease 11/2015    No family history on file.  Social History:  has no tobacco, alcohol, and drug history on file.  Allergies  Allergen Reactions  . Ace Inhibitors Cough  . Codeine Nausea Only  . Morphine And Related     Went crazy  . Penicillins Itching    No other information available at this time 04/07/2016  . Prednisone Other (See Comments)    Head aches   . Spiriva Handihaler [Tiotropium Bromide Monohydrate] Other (See Comments)    Urinary retention     MEDICATIONS:                                                                                                                     Scheduled: . antiseptic oral rinse  7 mL Mouth Rinse QID  . aspirin   324 mg Oral NOW   Or  . aspirin  300 mg Rectal NOW  . budesonide (PULMICORT) nebulizer solution  0.5 mg Nebulization BID  . chlorhexidine gluconate (SAGE KIT)  15 mL Mouth Rinse BID  . famotidine (PEPCID) IV  20 mg Intravenous Q12H  . heparin  5,000 Units Subcutaneous Q8H  . insulin aspart  0-9 Units Subcutaneous Q4H  . ipratropium-albuterol  3 mL Nebulization Q6H  . levothyroxine  44 mcg Intravenous Daily  . methylPREDNISolone (SOLU-MEDROL) injection  40 mg Intravenous Q12H  . metoprolol  2.5 mg Intravenous Q4H     ROS:                                                                                                                                       History obtained from chart  General ROS: negative for - chills, fatigue, fever, night sweats, weight gain or weight loss Psychological ROS: negative for - behavioral disorder, hallucinations, memory difficulties, mood swings or suicidal ideation Ophthalmic ROS: negative for - blurry vision, double vision, eye pain or loss of vision ENT ROS: negative for - epistaxis, nasal discharge, oral lesions, sore throat, tinnitus or vertigo Allergy and Immunology ROS: negative for - hives or itchy/watery eyes Hematological and Lymphatic ROS: negative for - bleeding problems, bruising or swollen lymph nodes Endocrine ROS: negative for - galactorrhea, hair pattern changes, polydipsia/polyuria or temperature intolerance Respiratory ROS: negative for - cough, hemoptysis, shortness of breath or wheezing Cardiovascular ROS: negative for - chest pain, dyspnea on exertion, edema or irregular heartbeat Gastrointestinal ROS: negative for - abdominal pain, diarrhea, hematemesis, nausea/vomiting or stool incontinence Genito-Urinary ROS: negative for - dysuria, hematuria, incontinence or urinary frequency/urgency Musculoskeletal ROS: negative for - joint swelling or muscular weakness Neurological ROS: as noted in HPI Dermatological ROS: negative for rash and  skin lesion changes   Blood pressure 134/83, pulse (!) 38, temperature 97.5 F (36.4 C), temperature source Oral, resp. rate (!) 23, height 5' 6"  (1.676 m), weight 64.1 kg (141 lb 5 oz), SpO2 97 %.   Neurologic Examination:                                                                                                      Intubated, but awake and relatively alert.  Opens eyes and makes good eye contact.  Follows all commands.  Squeezes both hands and lifts off bed.  Bilateral lower extremities normal in strength.    No babinski.  No hoffman's.    Extraocular movements are intact.  Pupils reactive.  Lab Results: Basic Metabolic Panel: No results for input(s): NA, K, CL, CO2, GLUCOSE, BUN, CREATININE, CALCIUM, MG, PHOS in the last 168 hours.  Liver Function Tests: No results for input(s): AST, ALT, ALKPHOS, BILITOT, PROT, ALBUMIN in the last 168 hours. No results for input(s): LIPASE, AMYLASE in the last 168 hours. No results for input(s): AMMONIA in the last 168 hours.  CBC:  Recent Labs Lab 03/27/2016 1515  WBC 22.3*  NEUTROABS PENDING  HGB 10.2*  HCT 31.2*  MCV 90.2  PLT PENDING    Cardiac Enzymes: No results for input(s): CKTOTAL, CKMB, CKMBINDEX, TROPONINI in the last 168 hours.  Lipid Panel: No results for input(s): CHOL, TRIG, HDL, CHOLHDL, VLDL, LDLCALC in the last 168 hours.  CBG:  Recent Labs Lab 03/19/2016 1432  GLUCAP 104*    Microbiology: No results found for this or any previous visit.  Coagulation Studies:  Recent Labs  03/28/2016 1515  LABPROT 14.6  INR 1.12    Imaging: Portable Chest Xray  Result Date: 04/08/2016 CLINICAL DATA:  Patient with acute respiratory failure. EXAM: PORTABLE CHEST 1 VIEW COMPARISON:  Chest radiograph 03/20/2016. FINDINGS: ET tube terminates in the mid trachea. Right IJ central venous catheter tip projects over the superior vena cava. Enteric tube courses inferior to the diaphragm. Stable cardiac and mediastinal  contours. Rightward shift of mediastinum. Moderate right pleural effusion with pulmonary consolidation within the right mid and lower hemi thorax. Left lung is clear. Monitoring leads overlie the patient. IMPRESSION: Persistent moderate right pleural effusion and underlying opacities favored to represent atelectasis. ET tube terminates in mid trachea. Electronically Signed   By: Lovey Newcomer M.D.   On: 03/30/2016 15:19    Assessment/Plan:  Neurological exam shows no definite focality to her exam and if there was a stroke it should have shown up on CT after 5 days.  She appears to be intact mentally.  She does have pneumonia and respiratory compromise as a result.  She may have early sepsis.  She likely has a small component of septic encephalopathy.  I do not suspect acute stroke, but she is at risk given PAF.  We will await the MRI and EEG .   Continue supportive care.    Further recommendations after those diagnostic tests.      Rogue Jury MD [phone]  03/22/2016, 4:12 PM

## 2016-04-09 NOTE — Significant Event (Signed)
CRITICAL VALUE ALERT  Critical value received:  Troponin 1.95  Date of notification:  04/14/2016   Time of notification:  1636  Critical value read back:Yes.    Nurse who received alert:  Sula Soda, RN  MD notified (1st page):  Marni Griffon, NP  Time of first page:  [phone]  Responding MD:  Marni Griffon, NP  Time MD responded:  [place]       Vena Austria

## 2016-04-09 NOTE — Progress Notes (Signed)
Labs reviewed. LA slighty elevated but actually hypertensive.  Has + CEs  Plan Get 2d echo to look for WM abnormality Cont bb  Will transduce CVP-->I do not think that sepsis and volume depletion is the etiology of her [place] ACNP-BC [place] Pager # [phone] OR # [phone] if no answer

## 2016-04-09 NOTE — Significant Event (Signed)
CRITICAL VALUE ALERT  Critical value received:  La Acid 2.3  Date of notification:  04/08/2016   Time of notification:  1602  Critical value read back:Yes.    Nurse who received alert:  L.Lynnae Sandhoff, RN  MD notified (1st page):  Marni Griffon, NP  Time of first page:  [phone]  Responding MD:  Marni Griffon, NP  Time MD responded:  [place]    Vena Austria

## 2016-04-09 NOTE — Progress Notes (Signed)
Elink notified of patients continued Afib RVR in the 110-160s despite scheduled IV Lopressor. Orders to be placed by MD.  Vena Austria

## 2016-04-09 NOTE — Progress Notes (Signed)
Pharmacy Antibiotic Note  Amy [name] is a 75 y.o. female admitted on 04/05/2016 with sepsis.  Pharmacy has been consulted for vanc/mero dosing. Lactic acid and WBC elevated. Currently afebrile.  While waiting for BMP, Dr. Orlena Sheldon noted this may be early sepsis. As such, sent mero 1g x1 and vanc 1250 mg LD. Per note, she has pneumonia and respiratory compromise. Will start maintenance doses tomorrow at 5 am.   Plan: Vanc '1250mg'$  LD and Meropenem 1g x1 sent.  Vancomycin 500 mg IV every 12 hours.  Goal trough 15-20 mcg/mL. Meropenem 1g IV q12h  Monitor renal function, Cx, temp, CBC   Height: '5\' 6"'$  (167.6 cm) Weight: 141 lb 5 oz (64.1 kg) IBW/kg (Calculated) : 59.3  Temp (24hrs), Avg:97.5 F (36.4 C), Min:97.5 F (36.4 C), Max:97.5 F (36.4 C)   Recent Labs Lab 04/04/2016 1515  WBC 22.3*  CREATININE 1.01*  LATICACIDVEN 2.3*    CrCl cannot be calculated (Patient's most recent lab result is older than the maximum 21 days allowed.).    Allergies  Allergen Reactions  . Ace Inhibitors Cough  . Codeine Nausea Only  . Morphine And Related     Went crazy  . Penicillins Itching    No other information available at this time 04/03/2016  . Prednisone Other (See Comments)    Head aches   . Spiriva Handihaler [Tiotropium Bromide Monohydrate] Other (See Comments)    Urinary retention     Antimicrobials this admission: vanc 7/23 >>  mero 7/23 >>    Microbiology results: 7/23 BCx:  7/23 UCx:    7/23 CXR: Persistent moderate right pleural effusion and underlying opacities favored to represent atelectasis.  Thank you for allowing pharmacy to be a part of this patient's care.  Dierdre Harness, [place], PharmD Clinical Pharmacy Resident [phone] (Pager) 03/21/2016 4:36 PM

## 2016-04-09 NOTE — Significant Event (Signed)
CRITICAL VALUE ALERT  Critical value received:  MRSA PCR (+)  Date of notification:  04/14/2016  Time of notification:  1805  Critical value read back:Yes.    Nurse who received alert:  Achille Rich  MD notified (1st page):  Larry Sierras  Time of first page:  [phone]  Responding MD:  Melvyn Novas  Time MD responded:  1806    Vena [place]

## 2016-04-10 ENCOUNTER — Inpatient Hospital Stay (HOSPITAL_COMMUNITY): Payer: Medicare Other

## 2016-04-10 ENCOUNTER — Ambulatory Visit (HOSPITAL_COMMUNITY): Payer: Medicare Other

## 2016-04-10 DIAGNOSIS — I639 Cerebral infarction, unspecified: Secondary | ICD-10-CM

## 2016-04-10 DIAGNOSIS — J9601 Acute respiratory failure with hypoxia: Secondary | ICD-10-CM

## 2016-04-10 DIAGNOSIS — J9600 Acute respiratory failure, unspecified whether with hypoxia or hypercapnia: Secondary | ICD-10-CM | POA: Diagnosis present

## 2016-04-10 DIAGNOSIS — G9340 Encephalopathy, unspecified: Secondary | ICD-10-CM

## 2016-04-10 DIAGNOSIS — I10 Essential (primary) hypertension: Secondary | ICD-10-CM

## 2016-04-10 DIAGNOSIS — Z515 Encounter for palliative care: Secondary | ICD-10-CM

## 2016-04-10 DIAGNOSIS — I63133 Cerebral infarction due to embolism of bilateral carotid arteries: Secondary | ICD-10-CM | POA: Diagnosis present

## 2016-04-10 LAB — BLOOD GAS, ARTERIAL
Acid-base deficit: 4.9 mmol/L — ABNORMAL HIGH (ref 0.0–2.0)
BICARBONATE: 18.5 meq/L — AB (ref 20.0–24.0)
Drawn by: 36277
FIO2: 0.5
LHR: 20 {breaths}/min
MECHVT: 450 mL
O2 Saturation: 98.9 %
PEEP: 5 cmH2O
PO2 ART: 151 mmHg — AB (ref 80.0–100.0)
Patient temperature: 99.1
TCO2: 19.4 mmol/L (ref 0–100)
pCO2 arterial: 28.1 mmHg — ABNORMAL LOW (ref 35.0–45.0)
pH, Arterial: 7.436 (ref 7.350–7.450)

## 2016-04-10 LAB — MAGNESIUM: MAGNESIUM: 1.4 mg/dL — AB (ref 1.7–2.4)

## 2016-04-10 LAB — GLUCOSE, CAPILLARY
GLUCOSE-CAPILLARY: 100 mg/dL — AB (ref 65–99)
GLUCOSE-CAPILLARY: 106 mg/dL — AB (ref 65–99)
GLUCOSE-CAPILLARY: 150 mg/dL — AB (ref 65–99)
Glucose-Capillary: 44 mg/dL — CL (ref 65–99)
Glucose-Capillary: 164 mg/dL — ABNORMAL HIGH (ref 65–99)
Glucose-Capillary: 108 mg/dL — ABNORMAL HIGH (ref 65–99)
Glucose-Capillary: 96 mg/dL (ref 65–99)

## 2016-04-10 LAB — ECHOCARDIOGRAM COMPLETE
FS: 17 % — AB (ref 28–44)
Height: 66 in
IV/PV OW: 0.78
LA ID, A-P, ES: 27 mm
LA diam end sys: 27 mm
LADIAMINDEX: 1.59 cm/m2
LAVOLA4C: 31.3 mL
LVOT area: 3.14 cm2
LVOTD: 20 mm
PW: 9 mm — AB (ref 0.6–1.1)
RV TAPSE: 20.3 mm
WEIGHTICAEL: 2183.44 [oz_av]

## 2016-04-10 LAB — BASIC METABOLIC PANEL
Anion gap: 9 (ref 5–15)
Anion gap: 8 (ref 5–15)
BUN: 16 mg/dL (ref 6–20)
BUN: 17 mg/dL (ref 6–20)
CALCIUM: 7.7 mg/dL — AB (ref 8.9–10.3)
CHLORIDE: 109 mmol/L (ref 101–111)
CO2: 20 mmol/L — ABNORMAL LOW (ref 22–32)
CO2: 20 mmol/L — ABNORMAL LOW (ref 22–32)
CREATININE: 1 mg/dL (ref 0.44–1.00)
CREATININE: 0.93 mg/dL (ref 0.44–1.00)
Calcium: 7.8 mg/dL — ABNORMAL LOW (ref 8.9–10.3)
Chloride: 108 mmol/L (ref 101–111)
GFR calc Af Amer: >60 mL/min (ref >60)
GFR calc Af Amer: >60 mL/min (ref >60)
GFR calc non Af Amer: 58 mL/min — ABNORMAL LOW (ref >60)
GFR, EST NON AFRICAN AMERICAN: 53 mL/min — AB (ref >60)
GLUCOSE: 216 mg/dL — AB (ref 65–99)
GLUCOSE: 124 mg/dL — AB (ref 65–99)
POTASSIUM: 2.6 mmol/L — AB (ref 3.5–5.1)
POTASSIUM: 3.8 mmol/L (ref 3.5–5.1)
SODIUM: 137 mmol/L (ref 135–145)
SODIUM: 137 mmol/L (ref 135–145)

## 2016-04-10 LAB — URINE CULTURE: Culture: NO GROWTH

## 2016-04-10 LAB — TROPONIN I: Troponin I: 0.64 ng/mL (ref <0.03)

## 2016-04-10 LAB — CBC
HCT: 28.8 % — ABNORMAL LOW (ref 36.0–46.0)
Hemoglobin: 9.2 g/dL — ABNORMAL LOW (ref 12.0–15.0)
MCH: 28.7 pg (ref 26.0–34.0)
MCHC: 31.9 g/dL (ref 30.0–36.0)
MCV: 89.7 fL (ref 78.0–100.0)
PLATELETS: 114 10*3/uL — AB (ref 150–400)
RBC: 3.21 MIL/uL — AB (ref 3.87–5.11)
RDW: 14.5 % (ref 11.5–15.5)
WBC: 23 10*3/uL — ABNORMAL HIGH (ref 4.0–10.5)

## 2016-04-10 LAB — PHOSPHORUS: PHOSPHORUS: 2 mg/dL — AB (ref 2.5–4.6)

## 2016-04-10 LAB — PROCALCITONIN: Procalcitonin: 0.75 ng/mL

## 2016-04-10 MED ORDER — GADOBENATE DIMEGLUMINE 529 MG/ML IV SOLN
15.0000 mL | Freq: Once | INTRAVENOUS | Status: AC | PRN
  Administered 2016-04-10: 12 mL via INTRAVENOUS

## 2016-04-10 MED ORDER — POTASSIUM CHLORIDE 10 MEQ/50ML IV SOLN
10.0000 meq | INTRAVENOUS | Status: AC
  Administered 2016-04-10 (×10): 10 meq via INTRAVENOUS
  Filled 2016-04-10 (×8): qty 50

## 2016-04-10 MED ORDER — SODIUM PHOSPHATES 45 MMOLE/15ML IV SOLN
30.0000 mmol | Freq: Once | INTRAVENOUS | Status: AC
  Administered 2016-04-10: 30 mmol via INTRAVENOUS
  Filled 2016-04-10: qty 10

## 2016-04-10 MED ORDER — FENTANYL CITRATE (PF) 100 MCG/2ML IJ SOLN
12.5000 ug | INTRAMUSCULAR | Status: DC | PRN
  Administered 2016-04-13 – 2016-04-14 (×2): 12.5 ug via INTRAVENOUS
  Filled 2016-04-10 (×2): qty 2

## 2016-04-10 MED ORDER — SODIUM CHLORIDE 0.9 % IV SOLN
INTRAVENOUS | Status: DC
  Administered 2016-04-10 – 2016-04-13 (×4): via INTRAVENOUS

## 2016-04-10 MED ORDER — SODIUM CHLORIDE 0.9 % IV SOLN
6.0000 g | Freq: Once | INTRAVENOUS | Status: DC
  Filled 2016-04-10: qty 12

## 2016-04-10 MED ORDER — DEXTROSE 50 % IV SOLN
INTRAVENOUS | Status: AC
  Filled 2016-04-10: qty 50

## 2016-04-10 MED FILL — Metoprolol Tartrate IV Soln 5 MG/5ML: INTRAVENOUS | Qty: 5 | Status: AC

## 2016-04-10 NOTE — Progress Notes (Signed)
Subjective: Patient remains intubated, responds to stimuli No pressors No BM yet   Objective: Vital signs in last 24 hours: Temp:  [97.5 F (36.4 C)-100.6 F (38.1 C)] 97.6 F (36.4 C) (07/24 0417) Pulse Rate:  [29-119] 105 (07/24 0723) Resp:  [13-28] 21 (07/24 0723) BP: (95-170)/(46-117) 95/64 (07/24 0723) SpO2:  [91 %-100 %] 99 % (07/24 0725) FiO2 (%):  [40 %-50 %] 40 % (07/24 0725) Weight:  [61.9 kg (136 lb 7.4 oz)-64.1 kg (141 lb 5 oz)] 61.9 kg (136 lb 7.4 oz) (07/24 0455) Last BM Date:  (unknown)  Intake/Output from previous day: 07/23 0701 - 07/24 0700 In: 2417 [I.V.:1735; NG/GT:90; IV Piggyback:592] Out: 3370 [Urine:2900; Emesis/NG output:450; Drains:20] Intake/Output this shift: No intake/output data recorded.  General appearance: intubated; minimally responsive to stimulation Resp: rhonchi bilaterally Cardio: irregularly irregular GI: soft, minimal distention; hypoactive bowel sounds; minimal tenderness along incision Midline incision c/d/i; LLQ ostomy site clean and dry; drain with old bloody fluid  Lab Results:   Recent Labs  03/27/2016 1515 04/10/16 0345  WBC 22.3* 23.0*  HGB 10.2* 9.2*  HCT 31.2* 28.8*  PLT 86* 114*   BMET  Recent Labs  04/11/2016 1515 04/10/16 0345  NA 139 137  K 4.0 2.6*  CL 111 108  CO2 22 20*  GLUCOSE 95 216*  BUN 17 16  CREATININE 1.01* 1.00  CALCIUM 7.9* 7.7*   PT/INR  Recent Labs  04/01/2016 1515  LABPROT 14.6  INR 1.12   ABG  Recent Labs  04/08/2016 1432 04/10/16 0319  PHART 7.428 7.436  HCO3 18.3* 18.5*    Studies/Results: Mr Jeri Cos Wo Contrast  Result Date: 04/10/2016 CLINICAL DATA:  Initial evaluation for acute weakness postop day 5 status post colostomy reversal. EXAM: MRI HEAD WITHOUT AND WITH CONTRAST TECHNIQUE: Multiplanar, multiecho pulse sequences of the brain and surrounding structures were obtained without and with intravenous contrast. CONTRAST:  59m MULTIHANCE GADOBENATE DIMEGLUMINE 529  MG/ML IV SOLN COMPARISON:  Prior CT from 04/08/2016. FINDINGS: Study mildly degraded by motion artifact. Cerebral volume within normal limits for patient age. Minimal T2/FLAIR hyperintensity within the periventricular white matter, likely related to chronic small vessel ischemic changes, within normal limits for patient age. Patchy multi focal bowl acute ischemic infarcts are seen involving the bilateral cerebral hemispheres. Specifically, there is patchy restricted diffusion involving the cortical gray matter of the anterior left frontal lobe (series 4, image 32). Few sub cm foci of restricted diffusion within the anterior/mid right centrum semi ovale (series 4, image 41, 40). Additional patchy cortical infarct within the right occipital pole (series 4, image 30). A small subcentimeter infarct within inferior right cerebellar hemisphere (series 4, image 11). More faint diffusion abnormality within the cortical gray matter and subcortical white matter of the right parietal lobe (series 4, image 44, 35) is likely more subacute in nature. No associated mass effect or hemorrhage. Major intracranial vascular flow voids are preserved. No mass lesion, mass effect, or midline shift. No hydrocephalus. No extra-axial fluid collection. Major dural sinuses are grossly patent. No abnormal enhancement, although evaluation somewhat limited due to motion artifact on this exam. Craniocervical junction within normal limits. Visualized upper cervical spine unremarkable. Pituitary gland normal. No acute abnormality about the globes and orbits. Patient is status post lens extraction bilaterally. Mild mucosal thickening within the ethmoidal air cells and maxillary sinuses. Paranasal sinuses are otherwise clear. Small bilateral mastoid effusions noted. Inner ear structures grossly normal. Bone marrow signal intensity within normal limits. No scalp soft tissue  abnormality. IMPRESSION: 1. Patchy acute and early subacute ischemic infarcts  involving the bilateral cerebral hemispheres and right cerebellar hemisphere as above. No associated mass effect or hemorrhage. Given the various vascular distributions involved, a central thromboembolic source is suspected. 2. No other acute intracranial process identified. Electronically Signed   By: Jeannine Boga M.D.   On: 04/10/2016 06:59  Portable Chest Xray  Result Date: 03/18/2016 CLINICAL DATA:  Patient with acute respiratory failure. EXAM: PORTABLE CHEST 1 VIEW COMPARISON:  Chest radiograph 04/12/2016. FINDINGS: ET tube terminates in the mid trachea. Right IJ central venous catheter tip projects over the superior vena cava. Enteric tube courses inferior to the diaphragm. Stable cardiac and mediastinal contours. Rightward shift of mediastinum. Moderate right pleural effusion with pulmonary consolidation within the right mid and lower hemi thorax. Left lung is clear. Monitoring leads overlie the patient. IMPRESSION: Persistent moderate right pleural effusion and underlying opacities favored to represent atelectasis. ET tube terminates in mid trachea. Electronically Signed   By: Lovey Newcomer M.D.   On: 04/14/2016 15:19   Anti-infectives: Anti-infectives    Start     Dose/Rate Route Frequency Ordered Stop   04/10/16 0500  meropenem (MERREM) 1 g in sodium chloride 0.9 % 100 mL IVPB     1 g 200 mL/hr over 30 Minutes Intravenous Every 12 hours 04/05/2016 1654     04/10/16 0500  vancomycin (VANCOCIN) 500 mg in sodium chloride 0.9 % 100 mL IVPB     500 mg 100 mL/hr over 60 Minutes Intravenous Every 12 hours 03/31/2016 1654     03/28/2016 1700  meropenem (MERREM) 1 g in sodium chloride 0.9 % 100 mL IVPB     1 g 200 mL/hr over 30 Minutes Intravenous  Once 03/22/2016 1638 03/20/2016 1730   04/06/2016 1700  vancomycin (VANCOCIN) IVPB 1000 mg/200 mL premix  Status:  Discontinued     1,000 mg 200 mL/hr over 60 Minutes Intravenous  Once 04/11/2016 1638 04/14/2016 1641   04/12/2016 1700  vancomycin (VANCOCIN)  1,250 mg in sodium chloride 0.9 % 250 mL IVPB     1,250 mg 166.7 mL/hr over 90 Minutes Intravenous  Once 03/29/2016 1641 03/21/2016 1900      Assessment/Plan: Colostomy takedown 7/18 at [facility] - Dr. Ladona Horns Post-operative ileus - recheck plain films today Wound - wick removed; dry dressings to midline and LLQ Leave drain in place for now. Consider starting tube feeds after evaluating plain films.    LOS: 1 day    Amy Reddy K. 04/10/2016

## 2016-04-10 NOTE — Progress Notes (Signed)
[facility] ADULT ICU REPLACEMENT PROTOCOL FOR AM LAB REPLACEMENT ONLY  The patient does apply for the [facility] Adult ICU Electrolyte Replacment Protocol based on the criteria listed below:   1. Is GFR >/= 40 ml/min? Yes.    Patient's GFR today is >60 2. Is urine output >/= 0.5 ml/kg/hr for the last 6 hours? Yes.   Patient's UOP is 1.21 ml/kg/hr 3. Is BUN < 60 mg/dL? Yes.    Patient's BUN today is 16 4. Abnormal electrolyte K 2.6, Mg 1.4, Phos 2.0 5. Ordered repletion with: per protocol 6. If a panic level lab has been reported, has the CCM MD in charge been notified? Yes.  .   Physician:  [place] 04/10/2016 6:11 AM

## 2016-04-10 NOTE — Procedures (Signed)
ELECTROENCEPHALOGRAM REPORT  Date of Study: 04/10/2016  Patient's Name: Amy [name] MRN: [id number] Date of Birth: 24-May-1941  Referring Provider: Dr. Marni Griffon  Clinical History: This is a 75 year old woman with altered mental status.  Medications: aspirin suppository 300 mg  diltiazem (CARDIZEM) 100 mg in dextrose 5 % 100 mL (1 mg/mL) infusion  famotidine (PEPCID) IVPB 20 mg premix  insulin aspart (novoLOG) injection 0-9 Units  levothyroxine (SYNTHROID, LEVOTHROID) injection 44 mcg  magnesium sulfate 6 g in sodium chloride 0.9 % 250 mL meropenem (MERREM) 1 g in sodium chloride 0.9 % 100 mL IVPB  methylPREDNISolone sodium succinate (SOLU-MEDROL) 125 mg/2 mL injection 40 mg  metoprolol (LOPRESSOR) injection 2.5 mg  vancomycin (VANCOCIN) 500 mg in sodium chloride 0.9 % 100 mL IVPB   Technical Summary: A multichannel digital EEG recording measured by the international 10-20 system with electrodes applied with paste and impedances below 5000 ohms performed as portable with EKG monitoring in an intubated awake and asleep patient. No sedating medications listed.  Hyperventilation and photic stimulation were not performed.  The digital EEG was referentially recorded, reformatted, and digitally filtered in a variety of bipolar and referential montages for optimal display.   Description: The patient is awake and asleep during the recording.  During maximal wakefulness, there is a symmetric, medium voltage 7 Hz posterior dominant rhythm that attenuates with eye opening. This is admixed with a moderate amount of diffuse 4-5 Hz theta and 2-3 Hz delta slowing of the waking background.  During drowsiness and sleep, there is an increase in theta and delta slowing of the background with poorly formed vertex waves seen.  Hyperventilation and photic stimulation were not performed.  There were no epileptiform discharges or electrographic seizures seen.    EKG lead showed irregular  rhythm.  Impression: This awake and asleep EEG is abnormal due to moderate diffuse slowing of the waking background.  Clinical Correlation of the above findings indicates diffuse cerebral dysfunction that is non-specific in etiology and can be seen with hypoxic/ischemic injury, toxic/metabolic encephalopathies, or medication effect.  The absence of epileptiform discharges does not rule out a clinical diagnosis of epilepsy.  Clinical correlation is advised.   Ellouise Newer, M.D.

## 2016-04-10 NOTE — Progress Notes (Signed)
Patient ID: Amy [name], female   DOB: 1941/03/29, 75 y.o.   MRN: [id number]   Plain films - no ileus or obstructive pattern.  OK to start tube feeds per CCM.  Imogene Burn. Georgette Dover, MD, [facility] Surgery  General/ Trauma Surgery  04/10/2016 3:18 PM

## 2016-04-10 NOTE — Progress Notes (Signed)
Bedside EEG completed, results pending. 

## 2016-04-10 NOTE — Progress Notes (Signed)
VASCULAR LAB PRELIMINARY  PRELIMINARY  PRELIMINARY  PRELIMINARY  Carotid duplex completed.    Preliminary report:  Right: unable to visualize vertebral,  ICA and ECA secondary to bandages and line in neck.  Left: 1-39% ICA stenosis.  Vertebral artery flow is antegrade.   Jonie Burdell, RVT 04/10/2016, 6:06 PM   ;

## 2016-04-10 NOTE — Progress Notes (Signed)
PULMONARY / CRITICAL CARE MEDICINE   Name: Amy [name] MRN: [id number] DOB: 1941-08-01    ADMISSION DATE:  04/14/2016 CONSULTATION DATE:  7/23  REFERRING MD:  [facility] hospital per family request   CHIEF COMPLAINT:  Acute encephalopathy and progressive respiratory failure   HISTORY OF PRESENT ILLNESS:   This is a 75 year old white female who was initially admitted to [facility] hospital where she underwent an elective loop colostomy takedown on 7/18. Her immediate post-op course was c/b lethargy and actually required narcan in PACU. She never really improved w/ hospital notes continuing to raise concern for altered mental status and difficulty to arouse. She ultimately required emergent intubation for hypercarbia (CO2 52) and inability to protect airway. She was transferred per family request on 7/23 for further evaluation and care.   SUBJECTIVE:  More awake and interactive  VITAL SIGNS: BP 95/64   Pulse (!) 105   Temp 97.6 F (36.4 C) (Oral)   Resp (!) 21   Ht '5\' 6"'$  (1.676 m)   Wt 136 lb 7.4 oz (61.9 kg)   SpO2 99%   BMI 22.03 kg/m   HEMODYNAMICS: CVP:  [2 mmHg-8 mmHg] 3 mmHg  VENTILATOR SETTINGS: Vent Mode: PSV;CPAP FiO2 (%):  [40 %-50 %] 40 % Set Rate:  [20 bmp] 20 bmp Vt Set:  [450 mL] 450 mL PEEP:  [5 cmH20] 5 cmH20 Pressure Support:  [5 cmH20] 5 cmH20 Plateau Pressure:  [12 cmH20-28 cmH20] 12 cmH20  INTAKE / OUTPUT: I/O last 3 completed shifts: In: 2417 [I.V.:1735; NG/GT:90; IV Piggyback:592] Out: 5284 [Urine:2900; Emesis/NG output:450; Drains:20]  PHYSICAL EXAMINATION: General:  Elderly 75 year old female. Much more awake, follows commands. No distress Neuro:  Opens eyes to verbal stimuli follows commands, strength seems equal  HEENT:  MMM, orally intubated  Cardiovascular:  Tachy irreg irreg  Lungs:  Rhonchi improved. No accessory use  Abdomen:  Soft, tender to palp. Staples intact, LLQ dressing intact  Musculoskeletal:  Good strength on right. Decreased  on left.  Skin:  Warm and dry   LABS:  BMET  Recent Labs Lab 03/27/2016 1515 04/10/16 0345  NA 139 137  K 4.0 2.6*  CL 111 108  CO2 22 20*  BUN 17 16  CREATININE 1.01* 1.00  GLUCOSE 95 216*    Electrolytes  Recent Labs Lab 03/30/2016 1515 04/10/16 0345  CALCIUM 7.9* 7.7*  MG 1.7 1.4*  PHOS 1.3* 2.0*    CBC  Recent Labs Lab 03/20/2016 1515 04/10/16 0345  WBC 22.3* 23.0*  HGB 10.2* 9.2*  HCT 31.2* 28.8*  PLT 86* 114*    Coag's  Recent Labs Lab 03/24/2016 1515  INR 1.12    Sepsis Markers  Recent Labs Lab 03/27/2016 1515 04/13/2016 1851 04/10/16 0345  LATICACIDVEN 2.3* 1.6  --   PROCALCITON 0.95  --  0.75    ABG  Recent Labs Lab 03/30/2016 1432 04/10/16 0319  PHART 7.428 7.436  PCO2ART 27.5* 28.1*  PO2ART 64.0* 151*    Liver Enzymes  Recent Labs Lab 03/23/2016 1515  AST 50*  ALT 36  ALKPHOS 96  BILITOT 1.3*  ALBUMIN 2.0*    Cardiac Enzymes  Recent Labs Lab 03/25/2016 1515 04/07/2016 2140 04/10/16 0430  TROPONINI 1.95* 0.63* 0.64*    Glucose  Recent Labs Lab 04/07/2016 1432 04/17/2016 1926 03/22/2016 2342 04/10/16 0413 04/10/16 0724 04/10/16 0735  GLUCAP 104* 76 100* 150* 44* 164*    Imaging Mr Brain W Wo Contrast  Result Date: 04/10/2016 CLINICAL DATA:  Initial evaluation for acute weakness postop day 5 status post colostomy reversal. EXAM: MRI HEAD WITHOUT AND WITH CONTRAST TECHNIQUE: Multiplanar, multiecho pulse sequences of the brain and surrounding structures were obtained without and with intravenous contrast. CONTRAST:  20m MULTIHANCE GADOBENATE DIMEGLUMINE 529 MG/ML IV SOLN COMPARISON:  Prior CT from 04/08/2016. FINDINGS: Study mildly degraded by motion artifact. Cerebral volume within normal limits for patient age. Minimal T2/FLAIR hyperintensity within the periventricular white matter, likely related to chronic small vessel ischemic changes, within normal limits for patient age. Patchy multi focal bowl acute ischemic infarcts  are seen involving the bilateral cerebral hemispheres. Specifically, there is patchy restricted diffusion involving the cortical gray matter of the anterior left frontal lobe (series 4, image 32). Few sub cm foci of restricted diffusion within the anterior/mid right centrum semi ovale (series 4, image 41, 40). Additional patchy cortical infarct within the right occipital pole (series 4, image 30). A small subcentimeter infarct within inferior right cerebellar hemisphere (series 4, image 11). More faint diffusion abnormality within the cortical gray matter and subcortical white matter of the right parietal lobe (series 4, image 44, 35) is likely more subacute in nature. No associated mass effect or hemorrhage. Major intracranial vascular flow voids are preserved. No mass lesion, mass effect, or midline shift. No hydrocephalus. No extra-axial fluid collection. Major dural sinuses are grossly patent. No abnormal enhancement, although evaluation somewhat limited due to motion artifact on this exam. Craniocervical junction within normal limits. Visualized upper cervical spine unremarkable. Pituitary gland normal. No acute abnormality about the globes and orbits. Patient is status post lens extraction bilaterally. Mild mucosal thickening within the ethmoidal air cells and maxillary sinuses. Paranasal sinuses are otherwise clear. Small bilateral mastoid effusions noted. Inner ear structures grossly normal. Bone marrow signal intensity within normal limits. No scalp soft tissue abnormality. IMPRESSION: 1. Patchy acute and early subacute ischemic infarcts involving the bilateral cerebral hemispheres and right cerebellar hemisphere as above. No associated mass effect or hemorrhage. Given the various vascular distributions involved, a central thromboembolic source is suspected. 2. No other acute intracranial process identified. Electronically Signed   By: BJeannine BogaM.D.   On: 04/10/2016 06:59  Portable Chest  Xray  Result Date: 04/04/2016 CLINICAL DATA:  Patient with acute respiratory failure. EXAM: PORTABLE CHEST 1 VIEW COMPARISON:  Chest radiograph 04/14/2016. FINDINGS: ET tube terminates in the mid trachea. Right IJ central venous catheter tip projects over the superior vena cava. Enteric tube courses inferior to the diaphragm. Stable cardiac and mediastinal contours. Rightward shift of mediastinum. Moderate right pleural effusion with pulmonary consolidation within the right mid and lower hemi thorax. Left lung is clear. Monitoring leads overlie the patient. IMPRESSION: Persistent moderate right pleural effusion and underlying opacities favored to represent atelectasis. ET tube terminates in mid trachea. Electronically Signed   By: DLovey NewcomerM.D.   On: 04/14/2016 15:19    STUDIES[facility]  [facility]  MR brain  7/23>>> EEG 7/23>>>  CULTURES: Sputum 7/23: GPC clusters>>> MRSA screen 7/22: positive  UC 7/23>>> BC 7/23>>  ANTIBIOTICS: vanc 7/23>>> meropenem 7/23>>>  SIGNIFICANT EVENTS: Loop colostomy takedown 7/18 7/18-7/22: progressively lethargic  7/23 transferred to [facility] s/p getting intubated for AMS and hypercarbia. Surgical service consulted at [facility] on arrival  7/24 on CCB gtt for af. But fully awake and following commands. Extubated.   LINES/TUBES: OETT 7/23>>>7/24  DISCUSSION: 75[facility] col[facility] t[facility]  on 7/18. Her immediate post-op course was c/b lethargy and actually required narcan in PACU. She never really improved w/ hospital notes continuing to raise concern for altered mental status and difficulty to arouse. MRI confirms new CVA. Suspect this was multifactorial: CVA, meds, +/- pneumonia. She is now remarkably more awake. Today the plan will be: extubate, cont CCB for af, Neuro/stroke eval for f/u recs, replace K, start to focus on rehab efforts. Spoke w/  surgical team, they will decide on oral intake.   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  P:   Extubate Scheduled BDs Scheduled systemic steroids (hold at '40mg'$  q12) NPO Start pulm hygiene    CARDIOVASCULAR A:  AF (RVR) HTN  Troponin elevation (stable) H/o bilateral carotid artery stenosis  P:  Dc lopressor Cont cardiazem gtt but try to avoid SBP <110 and >180 Tele monitoring  Will hold off from anticoagulation given bleed risk and hemorrhagic conversion of CVA  RENAL A:   Metabolic acidosis (NAG) Severe hypokalemia  Hypophosphatemia  Hypomagnesemia   P:   Replace K F/u chemistry Hold off on further diuretics for now  Careful obs of water balance in setting of new CVA; change to NS at 60 ml/hr  Strict I&O  GASTROINTESTINAL A:   Post-op day 5 colostomy take-down (w/ remote h/o diverticular disease) Post-op ileus  P:   NPO for now (plain films pending) Surgery following-->will decide on tubefeeds vs TNA next day or so Pepcid for SUP  HEMATOLOGIC A:   Leukocytosis  Anemia of critical illness P:  Cbc stable [place] heparin ( will eventually need systemic anticoagulation)   INFECTIOUS A:   R/o sepsis Possible HCAP--> growing GPC clusters in sputum. Rare yeast likely colonization  P:   Pan cultured Will cont meropenem and vanc for now as initiated at [facility]   ENDOCRINE A:   Hypothyroidism (TSH 28.2) P:   Her synthroid was increased to 44 from 25 mcgs IV. Will need repeat TSH on 8/6 Ck cortisol  SSI protocol   NEUROLOGIC A:   Acute and early subacute involving bilateral cerebral hemispheres and right cerebellar hemisphere  Acute Encephalopathy: suspect that this is multi-factorial-->cva and sedation +/- infection. Her exam has improved greatly since  arrival P:   RASS goal: 0 to -1 PAD protocol EEG pending  Will hold off on systemic anticoagulation given risk of hemorrhagic Conversion; although will eventually need systemic anticoagulation Neurology following-->defer to them on-going workup  Will order PT/OT when extubated  FAMILY  - Updated 7/24  - Inter-disciplinary family meet or Palliative Care meeting due by: 8/2   My PCCM time 60 minutes. This included: weaning and extubation assessment, coordinating care w/ nursing, respiratory, surgical and neuro team as well as bedside family update.   Erick Colace ACNP-BC [place] Pager # [phone] OR # [phone] if no answer  Attending Note:  75 year old female presenting for takedown of her colostomy in an outside hospital who developed acute changes in mental status and was transferred to [facility] where she was found to have had a stroke.  On exam, lungs with coarse BS but improving.  I reviewed CXR myself, edema noted.  Doing well on SBT, will extubate.  Mobilize.  OOB.  Titrate O2 for sat of 88-92%.  Family updated bedside.  The patient is critically ill with multiple organ systems failure and requires high complexity decision making for assessment and support, frequent evaluation and titration of therapies, application of advanced monitoring technologies and extensive interpretation of multiple databases.   Critical Care Time devoted to patient care services described in this note is  35  Minutes. This time reflects time of care of this signee Dr Jennet Maduro. This critical care time does not reflect procedure time, or teaching time or supervisory time of PA/NP/Med student/Med Resident etc but could involve care discussion time.  Rush Farmer, M.D. [facility] Pulmonary/Critical Care Medicine. Pager: [phone]. After hours pager: [phone].  04/10/2016, 9:09 AM

## 2016-04-10 NOTE — Progress Notes (Signed)
STROKE TEAM PROGRESS NOTE   HISTORY OF PRESENT ILLNESS (per record) Amy [name] is an 75 y.o. female who was transferred from [facility] due to respiratory failure and mental status changes.  She is s/p 5 days ago of colostomy reversal.  CXR shows right lung consolidation and pleural effusion.  WBC is 22K.  CT Brain was reviewed personally and is normal.  CMP is pending.   She developed new onset atrial fibrillation. MRI positive for embolic strokes. She was not administered IV t-PA due to recent surgery.   SUBJECTIVE (INTERVAL HISTORY) Her family - 2 sons - are at the bedside.  Overall she feels her condition is rapidly improving.    OBJECTIVE Temp:  [97.5 F (36.4 C)-100.6 F (38.1 C)] 97.6 F (36.4 C) (07/24 0417) Pulse Rate:  [29-119] 93 (07/24 1059) Cardiac Rhythm: Atrial fibrillation (07/24 0800) Resp:  [8-28] 15 (07/24 1059) BP: (95-170)/(38-117) 121/46 (07/24 1059) SpO2:  [91 %-100 %] 100 % (07/24 1059) FiO2 (%):  [40 %-50 %] 40 % (07/24 0725) Weight:  [61.9 kg (136 lb 7.4 oz)-64.1 kg (141 lb 5 oz)] 61.9 kg (136 lb 7.4 oz) (07/24 0455)  CBC:   Recent Labs Lab 04/10/2016 1515 04/10/16 0345  WBC 22.3* 23.0*  NEUTROABS 20.1*  --   HGB 10.2* 9.2*  HCT 31.2* 28.8*  MCV 90.2 89.7  PLT 86* 114*    Basic Metabolic Panel:   Recent Labs Lab 03/22/2016 1515 04/10/16 0345  NA 139 137  K 4.0 2.6*  CL 111 108  CO2 22 20*  GLUCOSE 95 216*  BUN 17 16  CREATININE 1.01* 1.00  CALCIUM 7.9* 7.7*  MG 1.7 1.4*  PHOS 1.3* 2.0*    Lipid Panel: No results found for: CHOL, TRIG, HDL, CHOLHDL, VLDL, LDLCALC   IMAGING  Mr Jeri Cos Wo Contrast  Result Date: 04/10/2016 CLINICAL DATA:  Initial evaluation for acute weakness postop day 5 status post colostomy reversal. EXAM: MRI HEAD WITHOUT AND WITH CONTRAST TECHNIQUE: Multiplanar, multiecho pulse sequences of the brain and surrounding structures were obtained without and with intravenous contrast. CONTRAST:  84m  MULTIHANCE GADOBENATE DIMEGLUMINE 529 MG/ML IV SOLN COMPARISON:  Prior CT from 04/08/2016. FINDINGS: Study mildly degraded by motion artifact. Cerebral volume within normal limits for patient age. Minimal T2/FLAIR hyperintensity within the periventricular white matter, likely related to chronic small vessel ischemic changes, within normal limits for patient age. Patchy multi focal bowl acute ischemic infarcts are seen involving the bilateral cerebral hemispheres. Specifically, there is patchy restricted diffusion involving the cortical gray matter of the anterior left frontal lobe (series 4, image 32). Few sub cm foci of restricted diffusion within the anterior/mid right centrum semi ovale (series 4, image 41, 40). Additional patchy cortical infarct within the right occipital pole (series 4, image 30). A small subcentimeter infarct within inferior right cerebellar hemisphere (series 4, image 11). More faint diffusion abnormality within the cortical gray matter and subcortical white matter of the right parietal lobe (series 4, image 44, 35) is likely more subacute in nature. No associated mass effect or hemorrhage. Major intracranial vascular flow voids are preserved. No mass lesion, mass effect, or midline shift. No hydrocephalus. No extra-axial fluid collection. Major dural sinuses are grossly patent. No abnormal enhancement, although evaluation somewhat limited due to motion artifact on this exam. Craniocervical junction within normal limits. Visualized upper cervical spine unremarkable. Pituitary gland normal. No acute abnormality about the globes and orbits. Patient is status post lens extraction bilaterally. Mild mucosal thickening  within the ethmoidal air cells and maxillary sinuses. Paranasal sinuses are otherwise clear. Small bilateral mastoid effusions noted. Inner ear structures grossly normal. Bone marrow signal intensity within normal limits. No scalp soft tissue abnormality. IMPRESSION: 1. Patchy acute  and early subacute ischemic infarcts involving the bilateral cerebral hemispheres and right cerebellar hemisphere as above. No associated mass effect or hemorrhage. Given the various vascular distributions involved, a central thromboembolic source is suspected. 2. No other acute intracranial process identified. Electronically Signed   By: Jeannine Boga M.D.   On: 04/10/2016 06:59  Portable Chest Xray  Result Date: 03/24/2016 CLINICAL DATA:  Patient with acute respiratory failure. EXAM: PORTABLE CHEST 1 VIEW COMPARISON:  Chest radiograph 03/24/2016. FINDINGS: ET tube terminates in the mid trachea. Right IJ central venous catheter tip projects over the superior vena cava. Enteric tube courses inferior to the diaphragm. Stable cardiac and mediastinal contours. Rightward shift of mediastinum. Moderate right pleural effusion with pulmonary consolidation within the right mid and lower hemi thorax. Left lung is clear. Monitoring leads overlie the patient. IMPRESSION: Persistent moderate right pleural effusion and underlying opacities favored to represent atelectasis. ET tube terminates in mid trachea. Electronically Signed   By: Lovey Newcomer M.D.   On: 04/17/2016 15:19    PHYSICAL EXAM Elderly Caucasian lady currently not in distress...sete . Afebrile. Head is nontraumatic. Neck is supple without bruit.    Cardiac exam no murmur or gallop. Lungs are clear to auscultation. Distal pulses are well felt. Neurological Exam ;  Awake  Alert oriented x 2. Diminished attention, registration and recall. Follows simple 1 step and occasional two-step commands only.. Normal speech and language.eye movements full without nystagmus.fundi were not visualized. Vision acuity and fields appear normal. Hearing is normal. Palatal movements are normal. Face symmetric. Tongue midline. Normal strength, tone, reflexes and coordination. Normal sensation. Gait deferred. ASSESSMENT/PLAN Amy [name] is a 75 y.o. female with  history of colostomy reversal who was difficult to arouse post op in setting of lethargy, status post Narcan, hypercarbia and inability to protect airway. She had new onset atrial fib. MRI now shows acute embolic strokes. She did not receive IV t-PA due to recent surgery.   Stroke:  bilateral cerebral and right cerebellar infarcts, embolic, secondary to new onset atrial fibrillation  MRI  Patchy bilateral cerebral and right cerebellar infarcts.  MRA  Not ordered  Check TCD  EEG pending   Carotid Doppler  ordered  2D Echo  pending   LDL ordered for am  HgbA1c not ordered  Heparin 5000 units sq tid for VTE prophylaxis Diet NPO time specified  aspirin 81 mg daily prior to admission, now on No antithrombotic as postop. Recommend resumption of aspirin 81 mg when okay from the surgical standpoint. Recommend NOAC once stroke stable, in about 7-10 days. Dr. Leonie Man discussed at length with Amy [name], CCM NP.  Ongoing aggressive stroke risk factor management  Therapy recommendations:  PT pending. OT added  Disposition:  pending   Atrial Fibrillation, new dx  Home anticoagulation:  none   Recommend resumption of aspirin 81 mg when okay from the surgical standpoint. Recommend NOAC once stroke stable, in about 7-10 days. Dr. Leonie Man discussed at length with Amy [name], CCM NP.    Hypertension  Stable  Long-term BP goal normotensive  Other Stroke Risk Factors  Advanced age  Hx Obstructive sleep apnea, intolerant to CPAP at home  H/o B ICA stenosis  Other Active Problems  5 days postop. Colostomy takedown  Hx non-small cell  lung cancer  COPD  Hospital day # Amy [name] for Pager information 04/10/2016 3:18 PM   I have personally examined this patient, reviewed notes, independently viewed imaging studies, participated in medical decision making and plan of care. I have made any additions or clarifications directly to the above note. Agree with  note above.  The etiology of patient's altered mental status and encephalopathy is likely multifactorial but she seems to be improving. The multiple small embolic strokes that she's had likely related to atrial fibrillation . Recommend aspirin for now and when patient is able to swallow consider switching to eliquis for anticoagulation. Discussed with critical care physician assistant. Greater than 50% time during this 25 minute visit was spent on counseling and coordination of care about stroke prevention and treatment. Stroke team will sign off. Kindly call for questions.  Antony Contras, MD Medical Director [place] Pager: [phone] 04/10/2016 5:48 PM   To contact Stroke Continuity provider, please refer to [email or web address] After hours, contact General Neurology

## 2016-04-10 NOTE — Procedures (Signed)
Extubation Procedure Note  Patient Details:   Name: Amy [name] DOB: 04/18/1941 MRN: [id number]   Airway Documentation:     Evaluation  O2 sats: stable throughout Complications: No apparent complications Patient did tolerate procedure well. Bilateral Breath Sounds: Clear, Diminished   Yes   Patient extubated to 2L nasal cannula per MD order.  Positive cuff leak noted.  No evidence of stridor. Patient able to speak post extubation.  Incentive spirometry performed x5 with achieved goal of 1000.  Sats currently 100%.  Vitals are stable.  No apparent complications.   Alphia Moh N 04/10/2016, 11:00 AM

## 2016-04-10 NOTE — Progress Notes (Signed)
  Echocardiogram 2D Echocardiogram has been performed.  Johny Chess 04/10/2016, 5:03 PM

## 2016-04-10 NOTE — Progress Notes (Signed)
Pt transported to and from MRI without event. °

## 2016-04-10 NOTE — Progress Notes (Signed)
CRITICAL VALUE ALERT  Critical value received:  K+= 2.6  Date of notification: t  Time of notification:  32  Critical value read back:Yes.    Nurse who received alert:  Hettie Holstein   MD notified (1st page):  eCCM  Time of first page:  0550  MD notified (2nd page):  Time of second page:  Responding MD:  CCM electrolyte protocol initiated, MD notified on rounds  Time MD responded:  [phone]

## 2016-04-11 ENCOUNTER — Ambulatory Visit (HOSPITAL_COMMUNITY): Payer: Medicare Other

## 2016-04-11 DIAGNOSIS — I639 Cerebral infarction, unspecified: Secondary | ICD-10-CM

## 2016-04-11 DIAGNOSIS — I4891 Unspecified atrial fibrillation: Secondary | ICD-10-CM

## 2016-04-11 LAB — VAS US CAROTID
LCCADDIAS: 14 cm/s
LCCADSYS: 62 cm/s
LCCAPSYS: 79 cm/s
LEFT ECA DIAS: -13 cm/s
LEFT VERTEBRAL DIAS: -7 cm/s
Left CCA prox dias: 11 cm/s
Left ICA dist dias: -31 cm/s
Left ICA dist sys: -101 cm/s
Left ICA prox dias: -17 cm/s
Left ICA prox sys: -67 cm/s
RCCAPDIAS: 9 cm/s
Right CCA prox sys: 65 cm/s

## 2016-04-11 LAB — GLUCOSE, CAPILLARY
GLUCOSE-CAPILLARY: 50 mg/dL — AB (ref 65–99)
GLUCOSE-CAPILLARY: 64 mg/dL — AB (ref 65–99)
GLUCOSE-CAPILLARY: 65 mg/dL (ref 65–99)
GLUCOSE-CAPILLARY: 89 mg/dL (ref 65–99)
GLUCOSE-CAPILLARY: 94 mg/dL (ref 65–99)
Glucose-Capillary: 100 mg/dL — ABNORMAL HIGH (ref 65–99)
Glucose-Capillary: 65 mg/dL (ref 65–99)
Glucose-Capillary: 77 mg/dL (ref 65–99)
Glucose-Capillary: 81 mg/dL (ref 65–99)
Glucose-Capillary: 102 mg/dL — ABNORMAL HIGH (ref 65–99)

## 2016-04-11 LAB — LIPID PANEL
Cholesterol: 129 mg/dL (ref 0–200)
HDL: 41 mg/dL (ref >40)
LDL CALC: 68 mg/dL (ref 0–99)
Total CHOL/HDL Ratio: 3.1 RATIO
Triglycerides: 98 mg/dL (ref <150)
VLDL: 20 mg/dL (ref 0–40)

## 2016-04-11 LAB — COMPREHENSIVE METABOLIC PANEL
ALBUMIN: 2 g/dL — AB (ref 3.5–5.0)
ALK PHOS: 92 U/L (ref 38–126)
ALT: 186 U/L — AB (ref 14–54)
AST: 257 U/L — ABNORMAL HIGH (ref 15–41)
Anion gap: 6 (ref 5–15)
BUN: 18 mg/dL (ref 6–20)
CALCIUM: 7.9 mg/dL — AB (ref 8.9–10.3)
CO2: 23 mmol/L (ref 22–32)
CREATININE: 0.87 mg/dL (ref 0.44–1.00)
Chloride: 112 mmol/L — ABNORMAL HIGH (ref 101–111)
GFR calc Af Amer: >60 mL/min (ref >60)
GFR calc non Af Amer: >60 mL/min (ref >60)
GLUCOSE: 108 mg/dL — AB (ref 65–99)
Potassium: 3.8 mmol/L (ref 3.5–5.1)
SODIUM: 141 mmol/L (ref 135–145)
Total Bilirubin: 1.2 mg/dL (ref 0.3–1.2)
Total Protein: 4.3 g/dL — ABNORMAL LOW (ref 6.5–8.1)

## 2016-04-11 LAB — MAGNESIUM: MAGNESIUM: 2.8 mg/dL — AB (ref 1.7–2.4)

## 2016-04-11 LAB — CBC
HCT: 27 % — ABNORMAL LOW (ref 36.0–46.0)
HEMOGLOBIN: 9.1 g/dL — AB (ref 12.0–15.0)
MCH: 29.4 pg (ref 26.0–34.0)
MCHC: 33.7 g/dL (ref 30.0–36.0)
MCV: 87.4 fL (ref 78.0–100.0)
Platelets: 169 10*3/uL (ref 150–400)
RBC: 3.09 MIL/uL — AB (ref 3.87–5.11)
RDW: 14.7 % (ref 11.5–15.5)
WBC: 35.3 10*3/uL — ABNORMAL HIGH (ref 4.0–10.5)

## 2016-04-11 LAB — PHOSPHORUS: Phosphorus: 3.3 mg/dL (ref 2.5–4.6)

## 2016-04-11 LAB — PROCALCITONIN: Procalcitonin: 0.65 ng/mL

## 2016-04-11 MED ORDER — APIXABAN 5 MG PO TABS
5.0000 mg | ORAL_TABLET | Freq: Two times a day (BID) | ORAL | Status: DC
  Administered 2016-04-11 – 2016-04-14 (×7): 5 mg via ORAL
  Filled 2016-04-11 (×7): qty 1

## 2016-04-11 MED ORDER — DEXTROSE 50 % IV SOLN
25.0000 mL | Freq: Once | INTRAVENOUS | Status: AC
  Administered 2016-04-11: 25 mL via INTRAVENOUS

## 2016-04-11 MED ORDER — ASPIRIN 81 MG PO CHEW
81.0000 mg | CHEWABLE_TABLET | Freq: Every day | ORAL | Status: DC
  Administered 2016-04-12 – 2016-04-14 (×3): 81 mg via ORAL
  Filled 2016-04-11 (×3): qty 1

## 2016-04-11 MED ORDER — METHYLPREDNISOLONE SODIUM SUCC 40 MG IJ SOLR
20.0000 mg | Freq: Two times a day (BID) | INTRAMUSCULAR | Status: DC
  Administered 2016-04-11 – 2016-04-12 (×2): 20 mg via INTRAVENOUS
  Filled 2016-04-11 (×2): qty 0.5

## 2016-04-11 MED ORDER — DEXTROSE 50 % IV SOLN
INTRAVENOUS | Status: AC
  Administered 2016-04-11: 25 mL via INTRAVENOUS
  Filled 2016-04-11: qty 50

## 2016-04-11 MED ORDER — DILTIAZEM HCL 30 MG PO TABS
30.0000 mg | ORAL_TABLET | Freq: Four times a day (QID) | ORAL | Status: DC
  Administered 2016-04-11 – 2016-04-13 (×7): 30 mg via ORAL
  Filled 2016-04-11 (×9): qty 1

## 2016-04-11 MED ORDER — ASPIRIN 300 MG RE SUPP
300.0000 mg | Freq: Once | RECTAL | Status: AC
  Administered 2016-04-11: 300 mg via RECTAL
  Filled 2016-04-11: qty 1

## 2016-04-11 MED ORDER — RESOURCE THICKENUP CLEAR PO POWD
ORAL | Status: DC | PRN
  Filled 2016-04-11: qty 125

## 2016-04-11 NOTE — Evaluation (Signed)
Speech Language Pathology Evaluation Patient Details Name: Amy [name] MRN: [id number] DOB: [birth year] Today's Date: 04/11/2016 Time: 5956-3875 SLP Time Calculation (min) (ACUTE ONLY): 22 min  Problem List:  Patient Active Problem List   Diagnosis Date Noted  . Acute respiratory failure ([place])   . Acute cerebrovascular accident (CVA) of cerebellum ([place])   . Acute encephalopathy 03/31/2016   Past Medical History:  Past Medical History:  Diagnosis Date  . Bilateral carotid artery stenosis   . COPD (chronic obstructive pulmonary disease) ([place])   . GERD (gastroesophageal reflux disease)   . HTN (hypertension)   . Hypothyroidism   . Non-small cell lung cancer ([place])   . OSA (obstructive sleep apnea)    intolerant of CPAP   Past Surgical History:  Past Surgical History:  Procedure Laterality Date  . APPENDECTOMY     at age 8  . COLOSTOMY TAKEDOWN  04/04/2016  . LUNG LOBECTOMY Right    RUL 1997 NSCLC  . TOTAL ABDOMINAL HYSTERECTOMY W/ BILATERAL SALPINGOOPHORECTOMY    . TRANSVERSE LOOP COLOSTOMY     w/ drainage of abscess for perf diverticular disease 3/20171   HPI:  75 year old white female who was initially admitted to [facility] where she underwent an elective loop colostomy takedown on 7/18. Pt with post-op AMS, requiring intuabtion 7/23-7/24. MRI showed patchy acute and early subacute ischemic infarcts involving the bilateral cerebral hemispheres and right cerebellar hemisphere. Pt's husband does endorse coughing during PO intake at home PTA.   Assessment / Plan / Recommendation Clinical Impression  Pt responds appropriately to simple questions and follows simple commands with Min cues, but with mildly more complex tasks and questioning she exhibits prolonged processing time with need for Mod cueing. She shows emergent awareness of cognitive changes by requesting help from her husband ~50% of the time. Speech is mildly dysarthric at the sentence level. Per spouse, pt is  cognitively sharp at home. Recommend SLP f/u to maximize cognitive and communicative recovery with CIR level f/u.    SLP Assessment  Patient needs continued Speech Lanaguage Pathology Services    Follow Up Recommendations  Inpatient Rehab    Frequency and Duration min 2x/week  2 weeks      SLP Evaluation Prior Functioning  Cognitive/Linguistic Baseline: Within functional limits  Lives With: Spouse Available Help at Discharge: Family   Cognition  Overall Cognitive Status: Impaired/Different from baseline Arousal/Alertness: Awake/alert Orientation Level: Oriented to person;Oriented to place;Oriented to situation;Disoriented to time Attention: Selective Selective Attention: Impaired Selective Attention Impairment: Verbal basic Memory: Impaired Memory Impairment: Storage deficit;Retrieval deficit;Decreased recall of new information Awareness: Impaired Awareness Impairment: Intellectual impairment;Emergent impairment;Anticipatory impairment Problem Solving: Impaired Problem Solving Impairment: Verbal basic Safety/Judgment: Impaired    Comprehension  Auditory Comprehension Overall Auditory Comprehension: Impaired Commands: Impaired One Step Basic Commands: 75-100% accurate Conversation: Simple Interfering Components: Attention;Processing speed;Working memory EffectiveTechniques: Extra processing time;Repetition;Slowed speech    Expression Expression Primary Mode of Expression: Verbal Verbal Expression Overall Verbal Expression: Appears within functional limits for tasks assessed   Oral / Motor  Oral Motor/Sensory Function Overall Oral Motor/Sensory Function: Mild impairment Facial Symmetry: Abnormal symmetry left;Suspected CN VII (facial) dysfunction Facial Strength: Reduced left;Suspected CN VII (facial) dysfunction Lingual ROM: Within Functional Limits Lingual Symmetry: Within Functional Limits Lingual Strength: Within Functional Limits Velum: Within Functional  Limits Mandible: Within Functional Limits Motor Speech Overall Motor Speech: Impaired Respiration: Within functional limits Phonation: Normal Resonance: Within functional limits Articulation: Impaired Level of Impairment: Sentence Intelligibility: Intelligibility reduced Sentence: 50-74% accurate  GO                    [name] 04/11/2016, 9:50 AM   [name], M.A. CCC-SLP [phone]

## 2016-04-11 NOTE — Progress Notes (Signed)
Subjective: Wants ice cream, no flatus or BM, no abd pain Objective: Vital signs in last 24 hours: Temp:  [97.3 F (36.3 C)-97.6 F (36.4 C)] 97.3 F (36.3 C) (07/25 1116) Pulse Rate:  [54-118] 107 (07/25 1100) Resp:  [11-23] 11 (07/25 1100) BP: (98-149)/(41-97) 133/57 (07/25 1100) SpO2:  [87 %-100 %] 100 % (07/25 1100) Weight:  [63.1 kg (139 lb 1.8 oz)] 63.1 kg (139 lb 1.8 oz) (07/25 0422) Last BM Date:  (unknown)  Intake/Output from previous day: 07/24 0701 - 07/25 0700 In: 3078.5 [I.V.:1838.5; NG/GT:30; IV Piggyback:1210] Out: 860 [Urine:840; Drains:20] Intake/Output this shift: Total I/O In: 240 [I.V.:240] Out: 135 [Urine:135]  General appearance: alert and cooperative Cardio: irregularly irregular rhythm GI: soft, incision CDI, drain with old blood, +BS .  Lab Results:   Recent Labs  04/10/16 0345 04/11/16 0410  WBC 23.0* 35.3*  HGB 9.2* 9.1*  HCT 28.8* 27.0*  PLT 114* 169   BMET  Recent Labs  04/10/16 1700 04/11/16 0410  NA 137 141  K 3.8 3.8  CL 109 112*  CO2 20* 23  GLUCOSE 124* 108*  BUN 17 18  CREATININE 0.93 0.87  CALCIUM 7.8* 7.9*   PT/INR  Recent Labs  04/13/2016 1515  LABPROT 14.6  INR 1.12   ABG  Recent Labs  03/21/2016 1432 04/10/16 0319  PHART 7.428 7.436  HCO3 18.3* 18.5*    Studies/Results: Mr Jeri Cos Wo Contrast  Result Date: 04/10/2016 CLINICAL DATA:  Initial evaluation for acute weakness postop day 5 status post colostomy reversal. EXAM: MRI HEAD WITHOUT AND WITH CONTRAST TECHNIQUE: Multiplanar, multiecho pulse sequences of the brain and surrounding structures were obtained without and with intravenous contrast. CONTRAST:  50m MULTIHANCE GADOBENATE DIMEGLUMINE 529 MG/ML IV SOLN COMPARISON:  Prior CT from 04/08/2016. FINDINGS: Study mildly degraded by motion artifact. Cerebral volume within normal limits for patient age. Minimal T2/FLAIR hyperintensity within the periventricular white matter, likely related to chronic  small vessel ischemic changes, within normal limits for patient age. Patchy multi focal bowl acute ischemic infarcts are seen involving the bilateral cerebral hemispheres. Specifically, there is patchy restricted diffusion involving the cortical gray matter of the anterior left frontal lobe (series 4, image 32). Few sub cm foci of restricted diffusion within the anterior/mid right centrum semi ovale (series 4, image 41, 40). Additional patchy cortical infarct within the right occipital pole (series 4, image 30). A small subcentimeter infarct within inferior right cerebellar hemisphere (series 4, image 11). More faint diffusion abnormality within the cortical gray matter and subcortical white matter of the right parietal lobe (series 4, image 44, 35) is likely more subacute in nature. No associated mass effect or hemorrhage. Major intracranial vascular flow voids are preserved. No mass lesion, mass effect, or midline shift. No hydrocephalus. No extra-axial fluid collection. Major dural sinuses are grossly patent. No abnormal enhancement, although evaluation somewhat limited due to motion artifact on this exam. Craniocervical junction within normal limits. Visualized upper cervical spine unremarkable. Pituitary gland normal. No acute abnormality about the globes and orbits. Patient is status post lens extraction bilaterally. Mild mucosal thickening within the ethmoidal air cells and maxillary sinuses. Paranasal sinuses are otherwise clear. Small bilateral mastoid effusions noted. Inner ear structures grossly normal. Bone marrow signal intensity within normal limits. No scalp soft tissue abnormality. IMPRESSION: 1. Patchy acute and early subacute ischemic infarcts involving the bilateral cerebral hemispheres and right cerebellar hemisphere as above. No associated mass effect or hemorrhage. Given the various vascular distributions involved, a central  thromboembolic source is suspected. 2. No other acute intracranial  process identified. Electronically Signed   By: Jeannine Boga M.D.   On: 04/10/2016 06:59  Portable Chest Xray  Result Date: 04/14/2016 CLINICAL DATA:  Patient with acute respiratory failure. EXAM: PORTABLE CHEST 1 VIEW COMPARISON:  Chest radiograph 04/08/2016. FINDINGS: ET tube terminates in the mid trachea. Right IJ central venous catheter tip projects over the superior vena cava. Enteric tube courses inferior to the diaphragm. Stable cardiac and mediastinal contours. Rightward shift of mediastinum. Moderate right pleural effusion with pulmonary consolidation within the right mid and lower hemi thorax. Left lung is clear. Monitoring leads overlie the patient. IMPRESSION: Persistent moderate right pleural effusion and underlying opacities favored to represent atelectasis. ET tube terminates in mid trachea. Electronically Signed   By: Lovey Newcomer M.D.   On: 03/22/2016 15:19  Dg Abd Portable 2v  Result Date: 04/10/2016 CLINICAL DATA:  Postop ileus. EXAM: PORTABLE ABDOMEN - 2 VIEW COMPARISON:  CT 02/10/2016 FINDINGS: Surgical drain noted the pelvis. Midline and left lower quadrant skin staples noted. No evidence of bowel obstruction or ileus currently. No free air organomegaly. Leftward scoliosis in the mid lumbar spine. NG tube tip is in the mid stomach. IMPRESSION: No evidence of bowel obstruction or ileus.  No free air. Electronically Signed   By: Rolm Baptise M.D.   On: 04/10/2016 11:04   Anti-infectives: Anti-infectives    Start     Dose/Rate Route Frequency Ordered Stop   04/10/16 0500  meropenem (MERREM) 1 g in sodium chloride 0.9 % 100 mL IVPB     1 g 200 mL/hr over 30 Minutes Intravenous Every 12 hours 03/21/2016 1654     04/10/16 0500  vancomycin (VANCOCIN) 500 mg in sodium chloride 0.9 % 100 mL IVPB     500 mg 100 mL/hr over 60 Minutes Intravenous Every 12 hours 04/04/2016 1654     03/23/2016 1700  meropenem (MERREM) 1 g in sodium chloride 0.9 % 100 mL IVPB     1 g 200 mL/hr over 30  Minutes Intravenous  Once 04/14/2016 1638 03/30/2016 1730   04/07/2016 1700  vancomycin (VANCOCIN) IVPB 1000 mg/200 mL premix  Status:  Discontinued     1,000 mg 200 mL/hr over 60 Minutes Intravenous  Once 04/14/2016 1638 04/17/2016 1641   03/31/2016 1700  vancomycin (VANCOCIN) 1,250 mg in sodium chloride 0.9 % 250 mL IVPB     1,250 mg 166.7 mL/hr over 90 Minutes Intravenous  Once 04/09/2016 1641 03/28/2016 1900      Assessment/Plan: Colostomy takedown 7/18 at [facility] - Dr. Ladona Horns - allow clears if passes swallow eval today Acute CVAs - suspect thromboembolic, per CCM, OK for Eliquis from surgical standpoint HCAP - per CCM I spoke with her family  LOS: 2 days    Esdras Delair E 04/11/2016

## 2016-04-11 NOTE — Progress Notes (Signed)
This am CBG [id number] D50 56m given 0835 CBG 65--0835 D50 240mgiven 0853 CBG 77

## 2016-04-11 NOTE — Evaluation (Signed)
Clinical/Bedside Swallow Evaluation Patient Details  Name: Amy [name] MRN: [id number] Date of Birth: 12-05-1940  Today's Date: 04/11/2016 Time: SLP Start Time (ACUTE ONLY): 0847 SLP Stop Time (ACUTE ONLY): 0900 SLP Time Calculation (min) (ACUTE ONLY): 13 min  Past Medical History:  Past Medical History:  Diagnosis Date  . Bilateral carotid artery stenosis   . COPD (chronic obstructive pulmonary disease) ([place])   . GERD (gastroesophageal reflux disease)   . HTN (hypertension)   . Hypothyroidism   . Non-small cell lung cancer ([place])   . OSA (obstructive sleep apnea)    intolerant of CPAP   Past Surgical History:  Past Surgical History:  Procedure Laterality Date  . APPENDECTOMY     at age 56  . COLOSTOMY TAKEDOWN  04/04/2016  . LUNG LOBECTOMY Right    RUL 1997 NSCLC  . TOTAL ABDOMINAL HYSTERECTOMY W/ BILATERAL SALPINGOOPHORECTOMY    . TRANSVERSE LOOP COLOSTOMY     w/ drainage of abscess for perf diverticular disease [id number]/20142   HPI:  75 year old white female who was initially admitted to [facility] hospital where she underwent an elective loop colostomy takedown on 7/18. Pt with post-op AMS, requiring intuabtion 7/23-7/24. MRI showed patchy acute and early subacute ischemic infarcts involving the bilateral cerebral hemispheres and right cerebellar hemisphere. Pt's husband does endorse coughing during PO intake at home PTA.   Assessment / Plan / Recommendation Clinical Impression  Pt's vocal quality is adequate after brief intubation, yet oropharyngeal swallow is characterized by mild, left-sided anterior loss, multiple swallows, and intermittent wet vocal quality with delayed coughing. Pt and her husband also endorse coughing at home during PO intake PTA, although pt felt like this only occurred when she was eating too quickly. Given the above, recommend to proceed with FEES to assess readiness for PO diet.    Aspiration Risk  Moderate aspiration risk    Diet Recommendation NPO    Medication Administration: Via alternative means    Other  Recommendations Oral Care Recommendations: Oral care QID   Follow up Recommendations   (tba)    Frequency and Duration            Prognosis Prognosis for Safe Diet Advancement: Good      Swallow Study   General Date of Onset: 04/04/16 HPI: 75 year old white female who was initially admitted to [facility] hospital where she underwent an elective loop colostomy takedown on 7/18. Pt with post-op AMS, requiring intuabtion 7/23-7/24. MRI showed patchy acute and early subacute ischemic infarcts involving the bilateral cerebral hemispheres and right cerebellar hemisphere. Pt's husband does endorse coughing during PO intake at home PTA. Type of Study: Bedside Swallow Evaluation Previous Swallow Assessment: none in chart Diet Prior to this Study: NPO Temperature Spikes Noted: No Respiratory Status: Nasal cannula History of Recent Intubation: Yes Length of Intubations (days): 1 days Date extubated: 04/10/16 Behavior/Cognition: Alert;Cooperative;Pleasant mood;Requires cueing Oral Cavity Assessment: Within Functional Limits Oral Care Completed by SLP: No Oral Cavity - Dentition: Adequate natural dentition;Missing dentition Vision: Functional for self-feeding Self-Feeding Abilities: Able to feed self;Needs assist Patient Positioning: Upright in bed Baseline Vocal Quality: Normal Volitional Cough: Congested    Oral/Motor/Sensory Function Overall Oral Motor/Sensory Function: Mild impairment Facial Symmetry: Abnormal symmetry left;Suspected CN VII (facial) dysfunction Facial Strength: Reduced left;Suspected CN VII (facial) dysfunction Lingual ROM: Within Functional Limits Lingual Symmetry: Within Functional Limits Lingual Strength: Within Functional Limits Velum: Within Functional Limits Mandible: Within Functional Limits   Ice Chips Ice chips: Impaired Presentation: Spoon Pharyngeal Phase  Impairments: Suspected delayed  Swallow;Decreased hyoid-laryngeal movement;Cough - Delayed   Thin Liquid Thin Liquid: Impaired Presentation: Cup;Self Fed;Spoon;Straw Oral Phase Functional Implications: Left anterior spillage Pharyngeal  Phase Impairments: Suspected delayed Swallow;Decreased hyoid-laryngeal movement;Cough - Delayed;Multiple swallows;Wet Vocal Quality    Nectar Thick Nectar Thick Liquid: Not tested   Honey Thick Honey Thick Liquid: Not tested   Puree Puree: Impaired Presentation: Spoon Pharyngeal Phase Impairments: Suspected delayed Swallow;Decreased hyoid-laryngeal movement;Multiple swallows   Solid   GO   Solid: Not tested        Amy [name] 04/11/2016,9:32 AM   Amy [name], M.A. CCC-SLP [phone]

## 2016-04-11 NOTE — Procedures (Signed)
Objective Swallowing Evaluation: Type of Study: FEES-Fiberoptic Endoscopic Evaluation of Swallow  Patient Details  Name: Amy [name] MRN: [id number] Date of Birth: 03/25/1941  Today's Date: 04/11/2016 Time: SLP Start Time (ACUTE ONLY): 1216-SLP Stop Time (ACUTE ONLY): 1238 SLP Time Calculation (min) (ACUTE ONLY): 22 min  Past Medical History:  Past Medical History:  Diagnosis Date  . Bilateral carotid artery stenosis   . COPD (chronic obstructive pulmonary disease) ([place])   . GERD (gastroesophageal reflux disease)   . HTN (hypertension)   . Hypothyroidism   . Non-small cell lung cancer ([place])   . OSA (obstructive sleep apnea)    intolerant of CPAP   Past Surgical History:  Past Surgical History:  Procedure Laterality Date  . APPENDECTOMY     at age 32  . COLOSTOMY TAKEDOWN  04/04/2016  . LUNG LOBECTOMY Right    RUL 1997 NSCLC  . TOTAL ABDOMINAL HYSTERECTOMY W/ BILATERAL SALPINGOOPHORECTOMY    . TRANSVERSE LOOP COLOSTOMY     w/ drainage of abscess for perf diverticular disease 11/5934   HPI: 75 year old white female who was initially admitted to [facility] hospital where she underwent an elective loop colostomy takedown on 7/18. Pt with post-op AMS, requiring intuabtion 7/23-7/24. MRI showed patchy acute and early subacute ischemic infarcts involving the bilateral cerebral hemispheres and right cerebellar hemisphere. Pt's husband does endorse coughing during PO intake at home PTA.  Subjective: pt alert, without complaints, wishes for ice cream  Assessment / Plan / Recommendation  CHL IP CLINICAL IMPRESSIONS 04/11/2016  Therapy Diagnosis Mild oral phase dysphagia;Mild pharyngeal phase dysphagia  Clinical Impression Pt has a mild oropharyngeal dysphagia with mildly slowed oral transit and piecemeal swallowing of purees. Thin liquids reach the pyriform sinuses before swallow initiation with only small spoonfuls. The majority of the bolus entered the laryngeal vestibule before the  swallow, although only trace amounts appeared to be aspirated, with the rest spontaneously clearing. Nectar thick liquids were better contained int he valleculae, with only small amounts of larger sips getting close to the airway before the swallow. Small amounts of residue remain after the swallow particularly with purees, but this clears spontaneously with extra swallows. Given spouse's report of frequent coughing at baseline, it is possible that this delay in swallow is not acute, but her sensation may be acutely exacerbated by brief-intubation and/or CVA. Pt is allowed to start clear liquid diet per medical team, therefore will start with nectar thick liquids only. Will f/u for advancement as medically appropriate.  Impact on safety and function Mild aspiration risk      CHL IP TREATMENT RECOMMENDATION 04/11/2016  Treatment Recommendations Therapy as outlined in treatment plan below     Prognosis 04/11/2016  Prognosis for Safe Diet Advancement Good  Barriers to Reach Goals --  Barriers/Prognosis Comment --    CHL IP DIET RECOMMENDATION 04/11/2016  SLP Diet Recommendations Nectar thick liquid  Liquid Administration via Cup;Spoon  Medication Administration Whole meds with puree  Compensations Minimize environmental distractions;Small sips/bites;Slow rate  Postural Changes Remain semi-upright after after feeds/meals (Comment);Seated upright at 90 degrees      CHL IP OTHER RECOMMENDATIONS 04/11/2016  Recommended Consults --  Oral Care Recommendations Oral care BID  Other Recommendations Order thickener from pharmacy;Prohibited food (jello, ice cream, thin soups);Remove water pitcher      CHL IP FOLLOW UP RECOMMENDATIONS 04/11/2016  Follow up Recommendations Inpatient Rehab      CHL IP FREQUENCY AND DURATION 04/11/2016  Speech Therapy Frequency (ACUTE ONLY) min 2x/week  Treatment Duration 2 weeks           CHL IP ORAL PHASE 04/11/2016  Oral Phase Impaired  Oral - Pudding Teaspoon --   Oral - Pudding Cup --  Oral - Honey Teaspoon --  Oral - Honey Cup --  Oral - Nectar Teaspoon Weak lingual manipulation  Oral - Nectar Cup Piecemeal swallowing;Weak lingual manipulation  Oral - Nectar Straw Piecemeal swallowing;Weak lingual manipulation  Oral - Thin Teaspoon Weak lingual manipulation  Oral - Thin Cup --  Oral - Thin Straw --  Oral - Puree Weak lingual manipulation;Piecemeal swallowing  Oral - Mech Soft --  Oral - Regular --  Oral - Multi-Consistency --  Oral - Pill --  Oral Phase - Comment --    CHL IP PHARYNGEAL PHASE 04/11/2016  Pharyngeal Phase Impaired  Pharyngeal- Pudding Teaspoon --  Pharyngeal --  Pharyngeal- Pudding Cup --  Pharyngeal --  Pharyngeal- Honey Teaspoon --  Pharyngeal --  Pharyngeal- Honey Cup --  Pharyngeal --  Pharyngeal- Nectar Teaspoon Delayed swallow initiation-pyriform sinuses  Pharyngeal --  Pharyngeal- Nectar Cup Delayed swallow initiation-pyriform sinuses  Pharyngeal --  Pharyngeal- Nectar Straw Delayed swallow initiation-pyriform sinuses  Pharyngeal --  Pharyngeal- Thin Teaspoon Delayed swallow initiation-pyriform sinuses;Penetration/Aspiration before swallow  Pharyngeal Material enters airway, passes BELOW cords without attempt by patient to eject out (silent aspiration)  Pharyngeal- Thin Cup --  Pharyngeal --  Pharyngeal- Thin Straw --  Pharyngeal --  Pharyngeal- Puree Delayed swallow initiation-vallecula  Pharyngeal --  Pharyngeal- Mechanical Soft --  Pharyngeal --  Pharyngeal- Regular --  Pharyngeal --  Pharyngeal- Multi-consistency --  Pharyngeal --  Pharyngeal- Pill --  Pharyngeal --  Pharyngeal Comment --     CHL IP CERVICAL ESOPHAGEAL PHASE 04/11/2016  Cervical Esophageal Phase WFL  Pudding Teaspoon --  Pudding Cup --  Honey Teaspoon --  Honey Cup --  Nectar Teaspoon --  Nectar Cup --  Nectar Straw --  Thin Teaspoon --  Thin Cup --  Thin Straw --  Puree --  Mechanical Soft --  Regular --   Multi-consistency --  Pill --  Cervical Esophageal Comment --    No flowsheet data found.  [name] 04/11/2016, 1:36 PM   [name], M.A. CCC-SLP [phone]

## 2016-04-11 NOTE — Progress Notes (Signed)
PULMONARY / CRITICAL CARE MEDICINE   Name: Amy [name] MRN: [id number] DOB: 18-Oct-1940    ADMISSION DATE:  03/19/2016 CONSULTATION DATE:  7/23  REFERRING MD:  [facility] hospital per family request   CHIEF COMPLAINT:  Acute encephalopathy and progressive respiratory failure   HISTORY OF PRESENT ILLNESS:   This is a 75 year old white female who was initially admitted to [facility] hospital where she underwent an elective loop colostomy takedown on 7/18. Her immediate post-op course was c/b lethargy and actually required narcan in PACU. She never really improved w/ hospital notes continuing to raise concern for altered mental status and difficulty to arouse. She ultimately required emergent intubation for hypercarbia (CO2 52) and inability to protect airway. She was transferred per family request on 7/23 for further evaluation and care.   SUBJECTIVE:  Extubated 7/24, More awake  VITAL SIGNS: BP (!) 117/57   Pulse (!) 107   Temp 97.4 F (36.3 C) (Oral)   Resp 15   Ht '5\' 6"'$  (1.676 m)   Wt 139 lb 1.8 oz (63.1 kg)   SpO2 99%   BMI 22.45 kg/m   HEMODYNAMICS: CVP:  [1 mmHg-6 mmHg] 4 mmHg  VENTILATOR SETTINGS:    INTAKE / OUTPUT: I/O last 3 completed shifts: In: 4880.5 [I.V.:3208.5; NG/GT:120; IV Piggyback:1552] Out: 2825 [Urine:2355; Emesis/NG output:450; Drains:20]  PHYSICAL EXAMINATION: General:  Elderly 75 year old female. Much more awake, follows commands. No distress Neuro:  Opens eyes to verbal stimuli follows commands, strength seems equal, no focal deficits NIH 1. Slight dysarthria, but tongue is slightly swollen HEENT:  MMM, bruising noted to tip of tongue  Cardiovascular:  Tachy irreg irreg (controlled rate 90's on dilt)  Lungs:  Rhonchi improved. No accessory use  Abdomen:  Soft, tender to palp. Staples intact, LLQ dressing intact  Musculoskeletal:  Strength appears equal  Skin:  Warm and dry   LABS:  BMET  Recent Labs Lab 04/10/16 0345 04/10/16 1700  04/11/16 0410  NA 137 137 141  K 2.6* 3.8 3.8  CL 108 109 112*  CO2 20* 20* 23  BUN '16 17 18  '$ CREATININE 1.00 0.93 0.87  GLUCOSE 216* 124* 108*    Electrolytes  Recent Labs Lab 03/22/2016 1515 04/10/16 0345 04/10/16 1700 04/11/16 0410  CALCIUM 7.9* 7.7* 7.8* 7.9*  MG 1.7 1.4*  --  2.8*  PHOS 1.3* 2.0*  --  3.3    CBC  Recent Labs Lab 04/05/2016 1515 04/10/16 0345 04/11/16 0410  WBC 22.3* 23.0* 35.3*  HGB 10.2* 9.2* 9.1*  HCT 31.2* 28.8* 27.0*  PLT 86* 114* 169    Coag's  Recent Labs Lab 03/21/2016 1515  INR 1.12    Sepsis Markers  Recent Labs Lab 04/01/2016 1515 04/05/2016 1851 04/10/16 0345 04/11/16 0410  LATICACIDVEN 2.3* 1.6  --   --   PROCALCITON 0.95  --  0.75 0.65    ABG  Recent Labs Lab 04/03/2016 1432 04/10/16 0319  PHART 7.428 7.436  PCO2ART 27.5* 28.1*  PO2ART 64.0* 151*    Liver Enzymes  Recent Labs Lab 04/17/2016 1515 04/11/16 0410  AST 50* 257*  ALT 36 186*  ALKPHOS 96 92  BILITOT 1.3* 1.2  ALBUMIN 2.0* 2.0*    Cardiac Enzymes  Recent Labs Lab 03/20/2016 1515 03/24/2016 2140 04/10/16 0430  TROPONINI 1.95* 0.63* 0.64*    Glucose  Recent Labs Lab 04/10/16 0735 04/10/16 1131 04/10/16 1518 04/10/16 2054 04/11/16 0003 04/11/16 0410  GLUCAP 164* 108* 96 106* 94 100*  Imaging Dg Abd Portable 2v  Result Date: 04/10/2016 CLINICAL DATA:  Postop ileus. EXAM: PORTABLE ABDOMEN - 2 VIEW COMPARISON:  CT 02/10/2016 FINDINGS: Surgical drain noted the pelvis. Midline and left lower quadrant skin staples noted. No evidence of bowel obstruction or ileus currently. No free air organomegaly. Leftward scoliosis in the mid lumbar spine. NG tube tip is in the mid stomach. IMPRESSION: No evidence of bowel obstruction or ileus.  No free air. Electronically Signed   By: Rolm Baptise M.D.   On: 04/10/2016 11:04    STUDIES:  CT head (at [facility]): negative  MR brain  7/23>>> Bilateral cerebral hemispheres and tight cerebellar hemisphere  patchy acute and early subacute infarcts. No mass effect or hemorrhage. Central thromboembolic source is suspected.  EEG 7/23>>> Abnormal due to moderate diffuse slowing of the waking background. No seizure or epiletiform discharges.  ECHO 7/24 >> EF 50-55%, lateral wall appears hypokinetic, systolic function normal, mild MR regurg Carotid US >> CULTURES: Sputum 7/23: GPC clusters>>> Abundant staph aureus MRSA screen 7/22: positive  UC 7/23>>> Negative BC 7/23>> NGTD  ANTIBIOTICS: vanc 7/23>>> meropenem 7/23>>>  SIGNIFICANT EVENTS: Loop colostomy takedown 7/18 7/18-7/22: progressively lethargic  7/23 transferred to [facility] s/p getting intubated for AMS and hypercarbia. Surgical service consulted at [facility] on arrival  7/24 on CCB gtt for af. But fully awake and following commands. Extubated. 7/25 SLP evaluation for diet, ASA started, FEES  LINES/TUBES: OETT 7/23>>>7/24  DISCUSSION: 75 year old white female who was initially admitted to [facility] hospital where she underwent an elective loop colostomy takedown on 7/18. Her immediate post-op course was c/b lethargy and actually required narcan in PACU. She never really improved w/ hospital notes continuing to raise concern for altered mental status and difficulty to arouse. MRI confirms new CVA. Suspect this was multifactorial: CVA, meds, +/- pneumonia. She is now remarkably more awake. Today the plan will be: pulmonary toilet, mobilization, weaning O2, start diet if cleared by speech, FEES pending and wean steroids.   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  P:   Extubated 7/24 Scheduled BDs Wean steroids to 20 mg BID NPO Start pulm hygiene  Mobilize Wean O2 for sats > 90 %  CARDIOVASCULAR A:  AF (RVR) HTN  Troponin elevation (stable) H/o  bilateral carotid artery stenosis  P:  Cont cardiazem gtt but try to avoid SBP <110 and >180 Transition Cardizem to PO once able to take PO Tele monitoring  Add NOAC once able to take PO ASA 300 PR today >>> then change to oral ASA '81mg'$  daily Consider statin for secondary stroke prevention once liver enzymes stabilize   RENAL A:   Metabolic acidosis (NAG) >> resolved Severe hypokalemia >> resolved  Hypophosphatemia>> resolved Hypomagnesemia>> resolved  P:   No indication for further diuresis  Careful obs of water balance in setting of new CVA; change to NS at 60 ml/hr  Strict I&O Remove foley when able   GASTROINTESTINAL A:   Post-op day 5 colostomy take-down (w/ remote h/o diverticular disease) Post-op ileus  Elevated Liver enzymes Slight dysphagia P:   NPO for now  KUB negative for ileus Surgery following--> OK to start TF/diet per surgery Awaiting SLP for clearance s/p CVA >> FEES today Pepcid for SUP >> continue as she is on Zantac at home Monitor  liver enzymes  HEMATOLOGIC A:   Leukocytosis  Anemia of critical illness DVT prophylaxis Cardiothrombotic CVA P:  Cbc stable [place] heparin  ASA 300 mg today PR Will change to ASA 81 mg daily once able to take PO NOAC once able to take PO (consider apixiban 5 mg BID (ARISTOTLE trial))   INFECTIOUS A:   R/o sepsis Possible HCAP--> growing GPC clusters in sputum. Rare yeast likely colonization Leukocytosis  P:   BC and UC negative Sputum culture with abundant staph aureus (waiting sensitivities) Continue Vanc and Merrem Leukocytosis increased this AM >> suspect demargination from steroids as no left shift Pro-calcitonin decreasing Afebrile Culture on 7/26 if temp > 101.5  ENDOCRINE A:   Hypothyroidism (TSH 28.2) Hypoglycemia P:   Continue synthroid 44 mcgs. Will need repeat TSH on 8/6 SSI protocol  Check A1C  NEUROLOGIC A:   Acute and early subacute involving bilateral cerebral hemispheres and right  cerebellar hemisphere  Acute Encephalopathy: suspect that this is multi-factorial-->cva and sedation +/- infection. Her exam has improved greatly since arrival P:    EEG diffuse slowing, no seizure  Start NOAC (consider apixiban) once able to take PO ASA 81 mg once able to take PO Neurology following-->defer to them on-going workup  PT/OT SLP cognitive evaluation Encourage awake/sleep cycles Minimal interruptions at night to decrease chance of ICU delirium  FAMILY  - Updated 7/25  - Inter-disciplinary family meet or Palliative Care meeting due by: 8/2  Attending Note:  75 year old female who was intubated due to inability to protect her airway post CVA during a colostomy take down.  The patient was extubated 7/24 and improved.  On exam, lungs are clear.  I reviewed CXR myself, mild edema noted.  Discussed with PCCM-NP.  CVA:  - Neurology following.  - Needs anti-coag.  - Rehab.  A-fib with RVR:  - Cardizem drip.  - Switch to PO when able to take PO.  - NOAC when able to take PO.  - If passes SLP then start eliquis, if not then will start a heparin drip.  Acute encephalopathy due to CVA.  - Stroke prevention as above.  - Monitor.  - Mobilize.  HCAP:  - Merrem.  - Vanc.  - F/u on culture.  Transfer to SDU and to [place] [facility] service with PCCM off 7/26.  Patient seen and examined, agree with above note.  I dictated the care and orders written for this patient under my direction.  Rush Farmer, MD [phone]

## 2016-04-11 NOTE — Progress Notes (Signed)
Rehab Admissions Coordinator Note:  Patient was screened by Cleatrice Burke for appropriateness for an Inpatient Acute Rehab Consult per PT recommendations.   At this time, we are recommending Inpatient Rehab consult. Please place order.  Cleatrice Burke 04/11/2016, 11:44 AM  I can be reached at [phone].

## 2016-04-11 NOTE — Progress Notes (Signed)
Vascular Ultrasound Transcranial Doppler has been completed.    04/11/2016 4:46 PM Maudry Mayhew, B.S., RVT, RDCS, RDMS

## 2016-04-11 NOTE — Evaluation (Signed)
Physical Therapy Evaluation Patient Details Name: Amy [name] MRN: [id number] DOB: 1940-12-02 Today's Date: 04/11/2016   History of Present Illness  Pt transferred from [facility] after reversal of colostomy. Post-op with lethargy and new onset afib. 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 Pt intubated until 7/24. PMH - lung CA, COPD, HTN  Clinical Impression  Pt admitted with above diagnosis and presents to PT with functional limitations due to deficits listed below (See PT problem list). Pt needs skilled PT to maximize independence and safety to allow discharge to CIR. Pt with good family support and multiple deficits from CVA and recent surgery.     Follow Up Recommendations CIR    Equipment Recommendations  Other (comment) (To be assessed)    Recommendations for Other Services Rehab consult     Precautions / Restrictions Precautions Precautions: Fall Restrictions Weight Bearing Restrictions: No      Mobility  Bed Mobility Overal bed mobility: Needs Assistance Bed Mobility: Supine to Sit;Sit to Supine     Supine to sit: +2 for physical assistance;Max assist Sit to supine: +2 for physical assistance;Max assist   General bed mobility comments: Assist to bring legs off of be and to elevate trunk. Assist to lower trunk and bring legs back up into bed.  Transfers Overall transfer level: Needs assistance Equipment used: Ambulation equipment used Transfers: Sit to/from Stand Sit to Stand: +2 physical assistance;Max assist         General transfer comment: Assist to bring hips and trunk up. Used Stedy and pt able to achieve standing but unable to fully extend hips and leaning posteriorly  Ambulation/Gait                Stairs            Wheelchair Mobility    Modified Rankin (Stroke Patients Only)       Balance Overall balance assessment: Needs assistance Sitting-balance support: Bilateral upper extremity  supported;Feet supported Sitting balance-Leahy Scale: Poor Sitting balance - Comments: Sat EOB x 10 minutes with min to max A. As pt fatigued pt  with worsening balance and with posterior lean. Postural control: Posterior lean Standing balance support: Bilateral upper extremity supported Standing balance-Leahy Scale: Zero Standing balance comment: Stedy and +2 max assist                             Pertinent Vitals/Pain Pain Assessment: Faces Faces Pain Scale: Hurts even more Pain Location: abdomen (with mobility) Pain Descriptors / Indicators: Grimacing;Operative site guarding Pain Intervention(s): Limited activity within patient's tolerance;Monitored during session;Repositioned    Home Living Family/patient expects to be discharged to:: Private residence Living Arrangements: Spouse/significant other Available Help at Discharge: Family;Available 24 hours/day Type of Home: House Home Access: Stairs to enter Entrance Stairs-Rails: Right Entrance Stairs-Number of Steps: 1 Home Layout: Two level;Able to live on main level with bedroom/bathroom Home Equipment: Walker - 2 wheels      Prior Function Level of Independence: Independent         Comments: Pt required ST-SNF after hospitalization several months ago but had regained independence and was able to do stairs as well.     Hand Dominance        Extremity/Trunk Assessment   Upper Extremity Assessment: Defer to OT evaluation           Lower Extremity Assessment: Generalized weakness         Communication   Communication: No  difficulties  Cognition Arousal/Alertness: Awake/alert Behavior During Therapy: Anxious Overall Cognitive Status: Impaired/Different from baseline Area of Impairment: Orientation;Attention;Memory;Following commands;Safety/judgement;Problem solving Orientation Level: Disoriented to;Time Current Attention Level: Sustained Memory: Decreased short-term memory Following Commands:  Follows one step commands inconsistently Safety/Judgement: Decreased awareness of safety;Decreased awareness of deficits   Problem Solving: Difficulty sequencing;Requires verbal cues;Requires tactile cues      General Comments      Exercises        Assessment/Plan    PT Assessment Patient needs continued PT services  PT Diagnosis Difficulty walking;Generalized weakness;Altered mental status   PT Problem List Decreased strength;Decreased activity tolerance;Decreased balance;Decreased mobility;Decreased cognition;Decreased knowledge of use of DME;Decreased knowledge of precautions  PT Treatment Interventions DME instruction;Gait training;Functional mobility training;Therapeutic activities;Therapeutic exercise;Balance training;Neuromuscular re-education;Cognitive remediation;Patient/family education   PT Goals (Current goals can be found in the Care Plan section) Acute Rehab PT Goals Patient Stated Goal: Pt did not state PT Goal Formulation: With patient Time For Goal Achievement: 04/25/16 Potential to Achieve Goals: Fair    Frequency Min 4X/week   Barriers to discharge        Co-evaluation               End of Session Equipment Utilized During Treatment: Oxygen Activity Tolerance: Patient limited by fatigue Patient left: in bed;with call bell/phone within reach;with family/visitor present Nurse Communication: Mobility status;Need for lift equipment         Time: 4628-6381 PT Time Calculation (min) (ACUTE ONLY): 21 min   Charges:   PT Evaluation $PT Eval High Complexity: 1 Procedure     PT G Codes:        Megen Madewell Apr 22, 2016, 11:25 AM Suanne Marker PT [phone]

## 2016-04-11 NOTE — Progress Notes (Signed)
OT Cancellation Note  Patient Details Name: Amy [name] MRN: [id number] DOB: 03-18-1941   Cancelled Treatment:    Reason Eval/Treat Not Completed: Patient at procedure or test/ unavailable (Having FEEs. Will attempt tomorrow.)  [place], OT/L  [phone] 04/11/2016 04/11/2016, 3:34 PM

## 2016-04-12 DIAGNOSIS — I6523 Occlusion and stenosis of bilateral carotid arteries: Secondary | ICD-10-CM

## 2016-04-12 DIAGNOSIS — IMO0002 Reserved for concepts with insufficient information to code with codable children: Secondary | ICD-10-CM

## 2016-04-12 DIAGNOSIS — I69391 Dysphagia following cerebral infarction: Secondary | ICD-10-CM

## 2016-04-12 DIAGNOSIS — D72829 Elevated white blood cell count, unspecified: Secondary | ICD-10-CM | POA: Diagnosis present

## 2016-04-12 DIAGNOSIS — I6350 Cerebral infarction due to unspecified occlusion or stenosis of unspecified cerebral artery: Secondary | ICD-10-CM

## 2016-04-12 DIAGNOSIS — I480 Paroxysmal atrial fibrillation: Secondary | ICD-10-CM | POA: Diagnosis present

## 2016-04-12 DIAGNOSIS — R000 Tachycardia, unspecified: Secondary | ICD-10-CM | POA: Diagnosis present

## 2016-04-12 DIAGNOSIS — Z9981 Dependence on supplemental oxygen: Secondary | ICD-10-CM

## 2016-04-12 DIAGNOSIS — Z9889 Other specified postprocedural states: Secondary | ICD-10-CM

## 2016-04-12 DIAGNOSIS — I69991 Dysphagia following unspecified cerebrovascular disease: Secondary | ICD-10-CM

## 2016-04-12 DIAGNOSIS — I4891 Unspecified atrial fibrillation: Secondary | ICD-10-CM | POA: Diagnosis present

## 2016-04-12 DIAGNOSIS — B370 Candidal stomatitis: Secondary | ICD-10-CM

## 2016-04-12 DIAGNOSIS — Z8719 Personal history of other diseases of the digestive system: Secondary | ICD-10-CM

## 2016-04-12 DIAGNOSIS — G4733 Obstructive sleep apnea (adult) (pediatric): Secondary | ICD-10-CM | POA: Diagnosis present

## 2016-04-12 DIAGNOSIS — D62 Acute posthemorrhagic anemia: Secondary | ICD-10-CM | POA: Diagnosis present

## 2016-04-12 DIAGNOSIS — J441 Chronic obstructive pulmonary disease with (acute) exacerbation: Secondary | ICD-10-CM

## 2016-04-12 DIAGNOSIS — J449 Chronic obstructive pulmonary disease, unspecified: Secondary | ICD-10-CM

## 2016-04-12 DIAGNOSIS — Z8673 Personal history of transient ischemic attack (TIA), and cerebral infarction without residual deficits: Secondary | ICD-10-CM

## 2016-04-12 LAB — COMPREHENSIVE METABOLIC PANEL
ALBUMIN: 2 g/dL — AB (ref 3.5–5.0)
ALK PHOS: 93 U/L (ref 38–126)
ALT: 182 U/L — AB (ref 14–54)
ANION GAP: 6 (ref 5–15)
AST: 166 U/L — ABNORMAL HIGH (ref 15–41)
BUN: 23 mg/dL — ABNORMAL HIGH (ref 6–20)
CALCIUM: 8.1 mg/dL — AB (ref 8.9–10.3)
CHLORIDE: 113 mmol/L — AB (ref 101–111)
CO2: 23 mmol/L (ref 22–32)
CREATININE: 0.82 mg/dL (ref 0.44–1.00)
GFR calc Af Amer: >60 mL/min (ref >60)
GFR calc non Af Amer: >60 mL/min (ref >60)
GLUCOSE: 99 mg/dL (ref 65–99)
Potassium: 3.7 mmol/L (ref 3.5–5.1)
SODIUM: 142 mmol/L (ref 135–145)
Total Bilirubin: 1.3 mg/dL — ABNORMAL HIGH (ref 0.3–1.2)
Total Protein: 4.3 g/dL — ABNORMAL LOW (ref 6.5–8.1)

## 2016-04-12 LAB — CULTURE, RESPIRATORY W GRAM STAIN

## 2016-04-12 LAB — VANCOMYCIN, TROUGH: Vancomycin Tr: 25 ug/mL (ref 15–20)

## 2016-04-12 LAB — CBC
HCT: 28.8 % — ABNORMAL LOW (ref 36.0–46.0)
HEMOGLOBIN: 9.2 g/dL — AB (ref 12.0–15.0)
MCH: 28.6 pg (ref 26.0–34.0)
MCHC: 31.9 g/dL (ref 30.0–36.0)
MCV: 89.4 fL (ref 78.0–100.0)
Platelets: 230 10*3/uL (ref 150–400)
RBC: 3.22 MIL/uL — ABNORMAL LOW (ref 3.87–5.11)
RDW: 14.8 % (ref 11.5–15.5)
WBC: 38.4 10*3/uL — ABNORMAL HIGH (ref 4.0–10.5)

## 2016-04-12 LAB — GLUCOSE, CAPILLARY
GLUCOSE-CAPILLARY: 58 mg/dL — AB (ref 65–99)
GLUCOSE-CAPILLARY: 101 mg/dL — AB (ref 65–99)
GLUCOSE-CAPILLARY: 109 mg/dL — AB (ref 65–99)
Glucose-Capillary: 101 mg/dL — ABNORMAL HIGH (ref 65–99)
Glucose-Capillary: 110 mg/dL — ABNORMAL HIGH (ref 65–99)

## 2016-04-12 LAB — MAGNESIUM: MAGNESIUM: 2.6 mg/dL — AB (ref 1.7–2.4)

## 2016-04-12 LAB — PHOSPHORUS: Phosphorus: 3 mg/dL (ref 2.5–4.6)

## 2016-04-12 LAB — CULTURE, RESPIRATORY

## 2016-04-12 MED ORDER — LEVOTHYROXINE SODIUM 88 MCG PO TABS
88.0000 ug | ORAL_TABLET | Freq: Every day | ORAL | Status: DC
  Administered 2016-04-13 – 2016-04-14 (×2): 88 ug via ORAL
  Filled 2016-04-12 (×2): qty 1

## 2016-04-12 MED ORDER — CHLORHEXIDINE GLUCONATE 0.12 % MT SOLN
15.0000 mL | Freq: Two times a day (BID) | OROMUCOSAL | Status: DC
  Administered 2016-04-12 – 2016-04-15 (×6): 15 mL via OROMUCOSAL
  Filled 2016-04-12 (×4): qty 15

## 2016-04-12 MED ORDER — VANCOMYCIN HCL IN DEXTROSE 750-5 MG/150ML-% IV SOLN
750.0000 mg | INTRAVENOUS | Status: DC
  Administered 2016-04-13 – 2016-04-16 (×4): 750 mg via INTRAVENOUS
  Filled 2016-04-12 (×5): qty 150

## 2016-04-12 MED ORDER — HYDRALAZINE HCL 20 MG/ML IJ SOLN
10.0000 mg | INTRAMUSCULAR | Status: DC | PRN
  Administered 2016-04-13: 10 mg via INTRAVENOUS
  Filled 2016-04-12: qty 1

## 2016-04-12 MED ORDER — CETYLPYRIDINIUM CHLORIDE 0.05 % MT LIQD
7.0000 mL | Freq: Two times a day (BID) | OROMUCOSAL | Status: DC
  Administered 2016-04-13 – 2016-04-14 (×4): 7 mL via OROMUCOSAL

## 2016-04-12 MED ORDER — INSULIN ASPART 100 UNIT/ML ~~LOC~~ SOLN: 0.0000 [IU] | Freq: Three times a day (TID) | SUBCUTANEOUS | Status: DC

## 2016-04-12 MED ORDER — FAMOTIDINE 20 MG PO TABS
20.0000 mg | ORAL_TABLET | Freq: Two times a day (BID) | ORAL | Status: DC
  Administered 2016-04-12 – 2016-04-14 (×5): 20 mg via ORAL
  Filled 2016-04-12 (×5): qty 1

## 2016-04-12 MED ORDER — SODIUM CHLORIDE 0.9 % IV SOLN
1.0000 g | Freq: Three times a day (TID) | INTRAVENOUS | Status: DC
  Administered 2016-04-12 – 2016-04-18 (×17): 1 g via INTRAVENOUS
  Filled 2016-04-12 (×21): qty 1

## 2016-04-12 MED ORDER — LABETALOL HCL 5 MG/ML IV SOLN
10.0000 mg | Freq: Once | INTRAVENOUS | Status: AC
  Administered 2016-04-13: 10 mg via INTRAVENOUS
  Filled 2016-04-12: qty 4

## 2016-04-12 MED ORDER — FLUCONAZOLE 200 MG PO TABS
200.0000 mg | ORAL_TABLET | Freq: Every day | ORAL | Status: DC
  Administered 2016-04-13: 200 mg via ORAL
  Filled 2016-04-12 (×3): qty 1

## 2016-04-12 NOTE — Progress Notes (Signed)
Patient's JP was not removed due to resistance and pain when attempted to pull out the tube. Dr Grandville Silos was notified about the JP drain. Patient was transferred to 3S with monitor on. Vital signs were stable, denied any pain. Report given to the nurse before the transfer and the family was notified in person.

## 2016-04-12 NOTE — Progress Notes (Signed)
Speech Language Pathology Treatment: Dysphagia  Patient Details Name: Amy [name] MRN: [id number] DOB: 06/29/1941 Today's Date: 04/12/2016 Time: 0102-7253 SLP Time Calculation (min) (ACUTE ONLY): 15 min  Assessment / Plan / Recommendation Clinical Impression  Pt consumed items from lunch tray (soft solids, nectar thick liquids by cup) with no overt s/s of aspiration, although she does have intermittent inhalation post-swallow. Min-Mod cues provided for pacing, particularly with solids. Pt says that she often eats "too fast" and that she was being fed by her husband too quickly earlier. RN says that pt was asking for POs rather quickly. Pt also had frequent eructation throughout meal and reports taking reflux medications at home (ranitidine). Overall, suspect that pt's impulsivity played a significant role in her coughing episode earlier, although effects of reflux cannot be excluded either. Recommend Dys 2 diet to facilitate oral clearance and continuing nectar thick liquids.    HPI HPI: 75 year old white female who was initially admitted to [facility] hospital where she underwent an elective loop colostomy takedown on 7/18. Pt with post-op AMS, requiring intuabtion 7/23-7/24. MRI showed patchy acute and early subacute ischemic infarcts involving the bilateral cerebral hemispheres and right cerebellar hemisphere. Pt's husband does endorse coughing during PO intake at home PTA.      SLP Plan  Continue with current plan of care     Recommendations  Diet recommendations: Dysphagia 2 (fine chop);Nectar-thick liquid Liquids provided via: Cup;No straw Medication Administration: Whole meds with puree Supervision: Staff to assist with self feeding;Full supervision/cueing for compensatory strategies Compensations: Minimize environmental distractions;Small sips/bites;Slow rate Postural Changes and/or Swallow Maneuvers: Seated upright 90 degrees;Upright 30-60 min after meal             Oral Care  Recommendations: Oral care BID Follow up Recommendations: Inpatient Rehab Plan: Continue with current plan of care     GO                Amy [name] 04/12/2016, 3:39 PM  Amy [name], M.A. CCC-SLP [phone]

## 2016-04-12 NOTE — Progress Notes (Signed)
CRITICAL VALUE ALERT  Critical value received:  Vanc trough 25  Date of notification:  04/12/16  Time of notification:  1800  Critical value read back:Yes.    Nurse who received alert:  [place] called and will make adjustments to medication

## 2016-04-12 NOTE — Progress Notes (Signed)
Patient with increased coughing with meal. Amy [name] with Speech called and will come evaluate patient.

## 2016-04-12 NOTE — Progress Notes (Signed)
eLink Physician-Brief Progress Note Patient Name: Sashay Felling DOB: [birth year] MRN: [id number]   Date of Service  04/12/2016  HPI/Events of Note  Elevated BP >180 After oral suctioning  eICU Interventions  Hydralazine prn ordered, hold off on oral care for now. PCP of patient notified by nurse but no answer     Intervention Category Evaluation Type: Other  Flora Lipps 04/12/2016, 11:52 PM

## 2016-04-12 NOTE — Progress Notes (Addendum)
PROGRESS NOTE    Amy [name]  XAJ:[id number] DOB: August 13, 1941 DOA: 04/08/2016 PCP: No primary care provider on file.   Brief Narrative:  75 year old WF PMHx Bilateral carotid artery stenosis; COPD, HTN, hypothyroidism, Non-small cell lung cancer S/P Right Lung Lobectomy , OSA  Initially admitted to [facility] where she underwent an elective loop colostomy takedown on 7/18. Her immediate post-op course was c/b lethargy and actually required narcan in PACU. She never really improved w/ hospital notes continuing to raise concern for altered mental status and difficulty to arouse. She ultimately required emergent intubation for hypercarbia (CO2 52) and inability to protect airway. She was transferred per family request on 7/23 for further evaluation and care.    Assessment & Plan:   Active Problems:   Acute encephalopathy   Acute respiratory failure with hypoxia and hypercarbia (HCC)   Cerebrovascular accident (CVA) due to bilateral embolism of carotid arteries   History of CVA (cerebrovascular accident)   COPD exacerbation (HCC)   OSA (obstructive sleep apnea)   Supplemental oxygen dependent   Paroxysmal atrial fibrillation (HCC)   Dysphagia, post-stroke   Hx of colostomy   Tachycardia   Atrial fibrillation with RVR (HCC)   Acute blood loss anemia   Leukocytosis   HCAP (healthcare-associated pneumonia)   Carotid artery stenosis   Oral candidiasis   Acute Hypercarbic respiratory failure (PCO2 52) -Resolved  COPD exacerbation/ HCAP   H/o OSA (intolerant of CPAP_  H/o NSCLC w/ RUL lobectomy (per records in remission)  -Extubated 7/24 Scheduled BDs Wean steroids to 20 mg BID NPO Start pulm hygiene  Mobilize Wean O2 for sats > 90 %  AF with (RVR) -Per Dr. Leonie Man note 7/24 Recommend resumption of aspirin 81 mg when okay from the surgical standpoint. Recommend NOAC once stroke stable, in about 7-10 days. Dr. Leonie Man discussed at length with NP Laurey Arrow, PCCM   -Eliquis 5  mg  BID  HTN   Troponin elevation (stable)  Bilateral carotid artery stenosis  -Cardizem 30 mg QID  -Aspirin 81 mg daily -Consider statin for secondary stroke prevention once liver enzymes stabilize    7/14 S/P colostomy take-down (w/ remote h/o diverticular disease) at [facility] hospital   Post-op ileus  -KUB negative for ileus  Elevated Liver enzymes  Dysphagia   Leukocytosis   Anemia of critical illness DVT prophylaxis Cardiothrombotic CVA P:  Cbc stable [place] heparin  ASA 300 mg today PR Will change to ASA 81 mg daily once able to take PO NOAC once able to take PO (consider apixiban 5 mg BID (ARISTOTLE trial))  HCAP -Sputum growing GPC clusters in sputum. Rare yeast likely colonization  Leukocytosis  BC and UC negative Sputum culture with abundant staph aureus (waiting sensitivities) -Continue Vanc and Merrem Leukocytosis increased this AM >> suspect demargination from steroids -Last 24 hours afebrile   Oral candidiasis -Diflucan 200 mg daily 7 days  Hypothyroidism  -7/23 TSH= 28.2 -New onset A. fib could certainly be secondary to hypo-or hyperthyroidism. -Synthroid increased to 88 g daily   Acute and early subacute involving bilateral cerebral hemispheres and right cerebellar hemisphere  -Stroke team believed secondary to new onset A. Fib  Acute Encephalopathy: - suspect that this is multi-factorial-->cva and sedation +/- infection.  EEG diffuse slowing, no seizure  Start NOAC (consider apixiban) once able to take PO ASA 81 mg once able to take PO Neurology following-->defer to them on-going workup  -PT/OT; recommends CIR SLP cognitive evaluation Encourage awake/sleep cycles Minimal interruptions at night  to decrease chance of ICU delirium   DVT prophylaxis: Eliquis Code Status: Full Family Communication: Husband and son at bedside Disposition Plan: CIR   Consultants:  Dr Garvin Fila,. Neurology stroke team Dr.Burke Grandville Silos  surgery Dr.Wesam Kathryne [facility] CM Dr. Jamse Arn physical medicine and rehabilitation    Procedures/Significant Events:  CT head (at [facility]): negative  MR brain  7/23>>> Bilateral cerebral hemispheres and tight cerebellar hemisphere patchy acute and  early subacute infarcts. No mass effect or hemorrhage. Central thromboembolic source is suspected.  EEG 7/23>>> Abnormal due to moderate diffuse slowing of the waking background. No seizure or epiletiform discharges.  ECHO 7/24 >> EF 50-55%, lateral wall appears hypokinetic, systolic function normal, mild MR regurg Carotid US >>   Loop colostomy takedown 7/18 7/18-7/22: progressively lethargic  7/23 transferred to [facility] s/p getting intubated for AMS and hypercarbia. Surgical service consulted at [facility] on arrival  7/24 on CCB gtt for af. But fully awake and following commands. Extubated. 7/25 SLP evaluation for diet, ASA started, FEES    Cultures Sputum 7/23: GPC clusters>>> Abundant staph aureus MRSA screen 7/22: positive  UC 7/23>>> Negative BC 7/23>> NGTD  Antimicrobials: vanc 7/23>>> meropenem 7/23>>>   Devices    LINES / TUBES:  ?? Right CVL>> OETT 7/23>>>7/24    Continuous Infusions: . sodium chloride 60 mL/hr at 04/12/16 1400     Subjective: 7/26  A/O 4, but still confused on some recent dates. Follows all commands    Objective: Vitals:   04/13/16 0200 04/13/16 0300 04/13/16 0500 04/13/16 0731  BP: (!) 143/84   (!) 152/72  Pulse: (!) 48   83  Resp: 18   11  Temp:    97.4 F (36.3 C)  TempSrc:  Axillary  Axillary  SpO2: 100%   100%  Weight:   65 kg (143 lb 4.8 oz)   Height:        Intake/Output Summary (Last 24 hours) at 04/13/16 1478 Last data filed at 04/12/16 2038  Gross per 24 hour  Intake              870 ml  Output              315 ml  Net              555 ml   Filed Weights   04/11/16 0422 04/12/16 0400 04/13/16 0500  Weight: 63.1 kg (139 lb 1.8 oz) 65.6 kg (144 lb 10 oz) 65 kg  (143 lb 4.8 oz)    Examination:  General: A/O 4, some confusion on recent events, No acute respiratory distress Eyes: negative scleral hemorrhage, negative anisocoria, negative icterus ENT: Negative Runny nose, negative gingival bleeding, Neck:  Negative scars, masses, torticollis, lymphadenopathy, JVD, right IJ CVL covering clean negative sign of infection Lungs: Clear to auscultation bilaterally without wheezes or crackles Cardiovascular: Irregular irregular rhythm and rate, without murmur gallop or rub normal S1 and S2 Abdomen: negative abdominal pain, nondistended, positive soft, bowel sounds, no rebound, no ascites, no appreciable mass, midline exploratory laparoscopy incision with staples negative sign of infection, LLQ incision covered and clean did not take down dressing, 1 JP drain present draining serous fluid. Extremities: No significant cyanosis, clubbing, or edema bilateral lower extremities Skin: Negative rashes, lesions, ulcers Psychiatric:  Negative depression, negative anxiety, negative fatigue, negative mania  Central nervous system:  Cranial nerves II through XII intact, tongue/uvula midline, all extremities muscle strength 5/5, sensation intact throughout,  negative dysarthria, negative  expressive aphasia, negative receptive aphasia.  .     Data Reviewed: Care during the described time interval was provided by me .  I have reviewed this patient's available data, including medical history, events of note, physical examination, and all test results as part of my evaluation. I have personally reviewed and interpreted all radiology studies.  CBC:  Recent Labs Lab 04/10/2016 1515 04/10/16 0345 04/11/16 0410 04/12/16 0355 04/13/16 0024 04/13/16 0600  WBC 22.3* 23.0* 35.3* 38.4* 47.1* PENDING  NEUTROABS 20.1*  --   --   --   --  PENDING  HGB 10.2* 9.2* 9.1* 9.2* 10.0* 9.3*  HCT 31.2* 28.8* 27.0* 28.8* 30.4* 28.5*  MCV 90.2 89.7 87.4 89.4 89.1 89.6  PLT 86* 114* 169  230 270 PENDING   Basic Metabolic Panel:  Recent Labs Lab 04/12/2016 1515 04/10/16 0345 04/10/16 1700 04/11/16 0410 04/12/16 0355 04/13/16 0024 04/13/16 0600  NA 139 137 137 141 142 143  --   K 4.0 2.6* 3.8 3.8 3.7 3.7  --   CL 111 108 109 112* 113* 114*  --   CO2 22 20* 20* 23 23 21*  --   GLUCOSE 95 216* 124* 108* 99 133*  --   BUN '17 16 17 18 '$ 23* 27*  --   CREATININE 1.01* 1.00 0.93 0.87 0.82 0.96  --   CALCIUM 7.9* 7.7* 7.8* 7.9* 8.1* 8.3*  --   MG 1.7 1.4*  --  2.8* 2.6*  --  2.4  PHOS 1.3* 2.0*  --  3.3 3.0  --   --    GFR: Estimated Creatinine Clearance: 45.9 mL/min (by C-G formula based on SCr of 0.96 mg/dL). Liver Function Tests:  Recent Labs Lab 03/24/2016 1515 04/11/16 0410 04/12/16 0355 04/13/16 0024  AST 50* 257* 166* 123*  ALT 36 186* 182* 159*  ALKPHOS 96 92 93 111  BILITOT 1.3* 1.2 1.3* 1.5*  PROT 4.4* 4.3* 4.3* 4.7*  ALBUMIN 2.0* 2.0* 2.0* 2.3*   No results for input(s): LIPASE, AMYLASE in the last 168 hours. No results for input(s): AMMONIA in the last 168 hours. Coagulation Profile:  Recent Labs Lab 03/18/2016 1515  INR 1.12   Cardiac Enzymes:  Recent Labs Lab 03/19/2016 1515 04/05/2016 2140 04/10/16 0430  CKTOTAL 47  --   --   TROPONINI 1.95* 0.63* 0.64*   BNP (last 3 results) No results for input(s): PROBNP in the last 8760 hours. HbA1C:  Recent Labs  04/12/16 0355  HGBA1C 5.5   CBG:  Recent Labs Lab 04/12/16 0712 04/12/16 0750 04/12/16 1233 04/12/16 1630 04/12/16 2242  GLUCAP 58* 101* 77 109* 80   Lipid Profile:  Recent Labs  04/11/16 0410  CHOL 129  HDL 41  LDLCALC 68  TRIG 98  CHOLHDL 3.1   Thyroid Function Tests: No results for input(s): TSH, T4TOTAL, FREET4, T3FREE, THYROIDAB in the last 72 hours. Anemia Panel: No results for input(s): VITAMINB12, FOLATE, FERRITIN, TIBC, IRON, RETICCTPCT in the last 72 hours. Urine analysis: No results found for: COLORURINE, APPEARANCEUR, LABSPEC, PHURINE, GLUCOSEU, HGBUR,  BILIRUBINUR, KETONESUR, PROTEINUR, UROBILINOGEN, NITRITE, LEUKOCYTESUR Sepsis Labs: '@LABRCNTIP'$ (procalcitonin:4,lacticidven:4)  ) Recent Results (from the past 240 hour(s))  MRSA PCR Screening     Status: Abnormal   Collection Time: 04/14/2016  2:15 PM  Result Value Ref Range Status   MRSA by PCR POSITIVE (A) NEGATIVE Final    Comment:        The GeneXpert MRSA Assay (FDA approved for NASAL specimens only), is one  component of a comprehensive MRSA colonization surveillance program. It is not intended to diagnose MRSA infection nor to guide or monitor treatment for MRSA infections. RESULT CALLED TO, READ BACK BY AND VERIFIED WITH: L.VERNON,RN 03/18/2016 '@1808'$  BY V.WILKINS   Culture, blood (routine x 2)     Status: None (Preliminary result)   Collection Time: 03/28/2016  3:17 PM  Result Value Ref Range Status   Specimen Description BLOOD RIGHT HAND  Final   Special Requests IN PEDIATRIC BOTTLE .5CC  Final   Culture NO GROWTH 3 DAYS  Final   Report Status PENDING  Incomplete  Urine culture     Status: None   Collection Time: 04/14/2016  3:50 PM  Result Value Ref Range Status   Specimen Description URINE, CATHETERIZED  Final   Special Requests NONE  Final   Culture NO GROWTH  Final   Report Status 04/10/2016 FINAL  Final  Culture, blood (routine x 2)     Status: None (Preliminary result)   Collection Time: 04/11/2016  4:11 PM  Result Value Ref Range Status   Specimen Description BLOOD LEFT FATTY CASTS  Final   Special Requests IN PEDIATRIC BOTTLE .5CC  Final   Culture NO GROWTH 3 DAYS  Final   Report Status PENDING  Incomplete  Culture, respiratory (tracheal aspirate)     Status: None   Collection Time: 03/24/2016  5:08 PM  Result Value Ref Range Status   Specimen Description TRACHEAL ASPIRATE  Final   Special Requests NONE  Final   Gram Stain   Final    ABUNDANT WBC PRESENT, PREDOMINANTLY PMN FEW SQUAMOUS EPITHELIAL CELLS PRESENT ABUNDANT GRAM POSITIVE COCCI IN PAIRS IN  CLUSTERS RARE BUDDING YEAST SEEN    Culture   Final    ABUNDANT METHICILLIN RESISTANT STAPHYLOCOCCUS AUREUS   Report Status 04/12/2016 FINAL  Final   Organism ID, Bacteria METHICILLIN RESISTANT STAPHYLOCOCCUS AUREUS  Final      Susceptibility   Methicillin resistant staphylococcus aureus - MIC*    CIPROFLOXACIN >=8 RESISTANT Resistant     ERYTHROMYCIN >=8 RESISTANT Resistant     GENTAMICIN <=0.5 SENSITIVE Sensitive     OXACILLIN >=4 RESISTANT Resistant     TETRACYCLINE 2 SENSITIVE Sensitive     VANCOMYCIN <=0.5 SENSITIVE Sensitive     TRIMETH/SULFA <=10 SENSITIVE Sensitive     CLINDAMYCIN >=8 RESISTANT Resistant     RIFAMPIN <=0.5 SENSITIVE Sensitive     Inducible Clindamycin NEGATIVE Sensitive     * ABUNDANT METHICILLIN RESISTANT STAPHYLOCOCCUS AUREUS         Radiology Studies: Dg Chest Port 1 View  Result Date: 04/13/2016 CLINICAL DATA:  Pleural effusion . EXAM: PORTABLE CHEST 1 VIEW COMPARISON:  04/11/2016 FINDINGS: Interim removal of endotracheal tube and NG tube. Right IJ line stable position. Heart size normal. Interim near complete resolution of right lower lobe atelectasis. Persistent left lower lobe infiltrate. Right upper lobe clips and sutures noted. Right apical pleural thickening. Persistent small bilateral pleural effusions. No pneumothorax. IMPRESSION: 1. Interval removal of endotracheal tube and NG tube. Right IJ line in stable position. 2. Interval resolution of right lower lobe atelectasis. Persistent left lower lobe infiltrate. Small bilateral pleural effusions. 3. Postsurgical changes right upper lung . Electronically Signed   By: Marcello Moores  Register   On: 04/13/2016 07:48  Dg Abd Portable 1v  Result Date: 04/13/2016 CLINICAL DATA:  75 year old female with abdominal pain, nausea vomiting. EXAM: PORTABLE ABDOMEN - 1 VIEW COMPARISON:  Abdominal radiograph dated 04/10/2016 FINDINGS: There has been  interval removal of the JP drainage catheter seen over the pelvis on the  prior study. Surgical suture line noted within the pelvis. Multiple midline and left lower quadrant cutaneous surgical clips noted. A or multiple dilated air-filled loops of small bowel measuring up to 3.6 cm in diameter most compatible with ileus versus small-bowel obstruction. No definite free air identified. No radiopaque calculi. There is degenerative changes of the spine and scoliosis. No acute fracture. IMPRESSION: Interval development of mildly dilated air-filled loops of small bowel in the mid abdomen and right lower quadrant compatible with ileus versus small-bowel obstruction. Clinical correlation follow-up recommended. Electronically Signed   By: Anner Crete M.D.   On: 04/13/2016 00:33       Scheduled Meds: . antiseptic oral rinse  7 mL Mouth Rinse q12n4p  . apixaban  5 mg Oral BID  . aspirin  81 mg Oral Daily  . budesonide (PULMICORT) nebulizer solution  0.5 mg Nebulization BID  . chlorhexidine  15 mL Mouth Rinse BID  . diltiazem  30 mg Oral Q6H  . famotidine  20 mg Oral BID  . fluconazole  200 mg Oral Daily  . insulin aspart  0-9 Units Subcutaneous TID AC & HS  . ipratropium-albuterol  3 mL Nebulization Q6H  . levothyroxine  88 mcg Oral QAC breakfast  . meropenem (MERREM) IV  1 g Intravenous Q8H  . metoprolol      . mupirocin ointment  1 application Nasal BID  . sodium chloride flush  10-40 mL Intracatheter Q12H  . vancomycin  750 mg Intravenous Q24H   Continuous Infusions: . sodium chloride 60 mL/hr at 04/12/16 1400     LOS: 4 days    Time spent: 40 minutes    Kanai Berrios, Geraldo Docker, MD [facility] Pager [phone]   If 7PM-7AM, please contact night-coverage [email or web address] Password [email or web address] 04/13/2016, 8:21 AM

## 2016-04-12 NOTE — Progress Notes (Signed)
PULMONARY / CRITICAL CARE MEDICINE   Name: Amy [name] MRN: [id number] DOB: 05/24/1941    ADMISSION DATE:  04/14/2016 CONSULTATION DATE:  7/23  REFERRING MD:  [facility] hospital per family request   CHIEF COMPLAINT:  Acute encephalopathy and progressive respiratory failure   HISTORY OF PRESENT ILLNESS:   This is a 75 year old white female who was initially admitted to [facility] hospital where she underwent an elective loop colostomy takedown on 7/18. Her immediate post-op course was c/b lethargy and actually required narcan in PACU. She never really improved w/ hospital notes continuing to raise concern for altered mental status and difficulty to arouse. She ultimately required emergent intubation for hypercarbia (CO2 52) and inability to protect airway. She was transferred per family request on 7/23 for further evaluation and care.   SUBJECTIVE:  Extubated 7/24, More awake  VITAL SIGNS: BP (!) 126/52   Pulse (!) 59   Temp 97.2 F (36.2 C) (Oral)   Resp (!) 9   Ht '5\' 6"'$  (1.676 m)   Wt 144 lb 10 oz (65.6 kg)   SpO2 100%   BMI 23.34 kg/m   HEMODYNAMICS: CVP:  [5 mmHg-6 mmHg] 5 mmHg  VENTILATOR SETTINGS:    INTAKE / OUTPUT: I/O last 3 completed shifts: In: 2975.5 [I.V.:2175.5; IV Piggyback:800] Out: 8563 [Urine:1270; Drains:25]  PHYSICAL EXAMINATION: General:  Elderly 75 year old female. Much more awake, follows commands. No distress Neuro:  Opens eyes to verbal stimuli follows commands, strength seems equal, no focal deficits NIH 1. Slight dysarthria, but tongue is slightly swollen HEENT:  MMM, bruising noted to tip of tongue  Cardiovascular:  Tachy irreg irreg (controlled rate 90's on dilt)  Lungs:  Rhonchi improved. No accessory use  Abdomen:  Soft, tender to palp. Staples intact, LLQ dressing intact  Musculoskeletal:  Strength appears equal  Skin:  Warm and dry   LABS:  BMET  Recent Labs Lab 04/10/16 1700 04/11/16 0410 04/12/16 0355  NA 137 141 142  K 3.8  3.8 3.7  CL 109 112* 113*  CO2 20* 23 23  BUN 17 18 23*  CREATININE 0.93 0.87 0.82  GLUCOSE 124* 108* 99    Electrolytes  Recent Labs Lab 04/10/16 0345 04/10/16 1700 04/11/16 0410 04/12/16 0355  CALCIUM 7.7* 7.8* 7.9* 8.1*  MG 1.4*  --  2.8* 2.6*  PHOS 2.0*  --  3.3 3.0    CBC  Recent Labs Lab 04/10/16 0345 04/11/16 0410 04/12/16 0355  WBC 23.0* 35.3* 38.4*  HGB 9.2* 9.1* 9.2*  HCT 28.8* 27.0* 28.8*  PLT 114* 169 230    Coag's  Recent Labs Lab 03/23/2016 1515  INR 1.12    Sepsis Markers  Recent Labs Lab 04/05/2016 1515 03/26/2016 1851 04/10/16 0345 04/11/16 0410  LATICACIDVEN 2.3* 1.6  --   --   PROCALCITON 0.95  --  0.75 0.65    ABG  Recent Labs Lab 03/28/2016 1432 04/10/16 0319  PHART 7.428 7.436  PCO2ART 27.5* 28.1*  PO2ART 64.0* 151*    Liver Enzymes  Recent Labs Lab 03/24/2016 1515 04/11/16 0410 04/12/16 0355  AST 50* 257* 166*  ALT 36 186* 182*  ALKPHOS 96 92 93  BILITOT 1.3* 1.2 1.3*  ALBUMIN 2.0* 2.0* 2.0*    Cardiac Enzymes  Recent Labs Lab 04/04/2016 1515 04/13/2016 2140 04/10/16 0430  TROPONINI 1.95* 0.63* 0.64*    Glucose  Recent Labs Lab 04/11/16 1520 04/11/16 1523 04/11/16 1950 04/11/16 2024 04/12/16 0007 04/12/16 0416  GLUCAP 39* 81 65  102* 101* 110*    Imaging No results found.   STUDIES:  CT head (at [facility]): negative  MR brain  7/23>>> Bilateral cerebral hemispheres and tight cerebellar hemisphere patchy acute and early subacute infarcts. No mass effect or hemorrhage. Central thromboembolic source is suspected.  EEG 7/23>>> Abnormal due to moderate diffuse slowing of the waking background. No seizure or epiletiform discharges.  ECHO 7/24 >> EF 50-55%, lateral wall appears hypokinetic, systolic function normal, mild MR regurg Carotid US >> right mild soft plaque CCA, mild to moderate plaque origin and proximal ICA and ECA 1-39% ICA stenosis.   CULTURES: Sputum 7/23: GPC clusters>>> Abundant MRSA  (sensitive to Vanc) MRSA screen 7/22: positive  UC 7/23>>> Negative BC 7/23>> NGTD  ANTIBIOTICS: vanc 7/23>>> meropenem 7/23>>>  SIGNIFICANT EVENTS: Loop colostomy takedown 7/18 7/18-7/22: progressively lethargic  7/23 [facility] to [facility]/p getting intubated for AMS and hypercarbia. Surgical service consulted at [facility] on arrival  7/24 on CCB gtt for af. But fully awake and following commands. Extubated. 7/25 SLP evaluation for diet, ASA started, FEES  LINES/TUBES: OETT 7/23>>>7/24  DISCUSSION: 75 year old white female who was initially admitted to [facility] hospital where she underwent an elective loop colostomy takedown on 7/18. Her immediate post-op course was c/b lethargy and actually required narcan in PACU. She never really improved w/ hospital notes continuing to raise concern for altered mental status and difficulty to arouse. MRI confirms new CVA. Suspect this was multifactorial: CVA, meds, +/- pneumonia. She is now  more awake but still confused. Today the plan will be: pulmonary toilet, mobilization, weaning O2, advance diet per surgery, and d/c steroids. I would keep her in the SDU setting another day given her delirium. If she spikes fever, WBC count does not improve, or she has more pain would have low threshold to image abd to r/o occult abscess.   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  Extubated 7/24 P:   Scheduled BDs DC steroids Continue pulm hygiene  Mobilize Wean O2 for sats > 90 %  CARDIOVASCULAR A:  AF (RVR) HTN  Troponin elevation (stable) H/o bilateral carotid artery stenosis  P:  Cardizem transitioned to PO Tele monitoring  apixiban 5 mg BID started 7/26 ASA '81mg'$  daily Consider statin for secondary stroke prevention once liver enzymes  stabilize   RENAL A:   Metabolic acidosis (NAG) >> resolved Severe hypokalemia >> resolved  Hypophosphatemia>> resolved Hypomagnesemia>> resolved Mild hyperchloremia  P:   No indication for further diuresis  Careful obs of water balance in setting of new CVA; changed to NS at 60 ml/hr  Strict I&O DC Foley   GASTROINTESTINAL A:   Post-op day 5 colostomy take-down (w/ remote h/o diverticular disease) Post-op ileus  Elevated Liver enzymes Slight dysphagia  P:   KUB negative for ileus Surgery following--> OK to advance diet to soft diet with Nectar thick  Pepcid for SUP >> continue as she is on Zantac at home Liver enzymes improving >> continue to trend  HEMATOLOGIC A:   Leukocytosis  Anemia of critical illness DVT prophylaxis Cardiothrombotic CVA  P:  Cbc stable [place] heparin  ASA 300 mg today PR Will change to ASA 81 mg daily once able to take PO apixiban 5 mg BID (ARISTOTLE trial)   INFECTIOUS A:  R/o sepsis Possible HCAP--> growing GPC clusters in sputum. Rare yeast likely colonization Leukocytosis  P:   BC and UC negative Sputum culture with MRSA (sensitive to Vanc) Continue Vanc and Merrem Leukocytosis increased this AM >> suspect demargination from steroids as no left shift: stopping steroids Pro-calcitonin decreasing Afebrile Culture on 7/26 if temp > 101.5  ENDOCRINE A:   Hypothyroidism (TSH 28.2) Hypoglycemia  P:   Continue synthroid 44 mcgs. Will need repeat TSH on 8/6 SSI protocol  Check A1C  NEUROLOGIC A:   Acute and early subacute involving bilateral cerebral hemispheres and right cerebellar hemisphere  Acute Encephalopathy: suspect that this is multi-factorial-->cva and sedation +/- infection vs steroids. Her exam had improved greatly since arrival, but now a more confused. She has not gotten much in the way of Narcotics. EEG was negative  P:   apixiban 5 mg BID  ASA 81 mg once able to take PO Neurology following-->defer to them  on-going workup  PT/OT SLP cognitive evaluation Encourage awake/sleep cycles Minimal interruptions at night to decrease of ICU delirium IF delirium continues OFF steroids we may need to consider CT imaging of abd r/o occult abscess.  FAMILY  - Updated 7/25  - Inter-disciplinary family meet or Palliative Care meeting due by: 8/2  - Transfer to [facility] today (7/26)  Erick Colace ACNP-BC [place] Pager # [phone] OR # [phone] if no answer  Attending Note:  75 year old female who was intubated due to inability to protect her airway post CVA during a colostomy take down.  The patient was extubated 7/24 and improved.  On exam, lungs are clear.  I reviewed CXR myself, mild edema noted.  Discussed with PCCM-NP.  CVA:                      - Neurology following.                      - Eliquis.                      - Rehab.  A-fib with RVR:                      - Cardizem drip off.                      - Cardizem PO.                      - NOAC as above.  Acute encephalopathy due to CVA.                      - Stroke prevention as above.                      - Monitor.                      - Mobilize.  HCAP:                      - Merrem.                      - Vanc.                      - F/u on culture.  Transfer to SDU and PCCM  signing off, please call back if needed.  Patient seen and examined, agree with above note.  I dictated the care and orders written for this patient under my direction.  Rush Farmer, MD [phone]

## 2016-04-12 NOTE — Progress Notes (Signed)
Patient ID: Amy [name], female   DOB: [birth year], 75 y.o.   MRN: [id number] I removed her LLQ drain. Georganna Skeans, MD, MPH, FACS Trauma: [phone] General Surgery: [phone]

## 2016-04-12 NOTE — Progress Notes (Signed)
Pharmacy Antibiotic Note  Amy [name] is a 75 y.o. female admitted on 04/06/2016 from [facility] with respiratory failure.  Pt was started on Vancomycin for MRSA trach aspirate.  Today is day #4 of therapy.   Vancomycin trough level = 25 (goal 15-20)  Spoke with RN, Vancomycin dose that was scheduled for 04/12/2016 1700 was started after blood drawn and was stopped promptly once nurse notified of elevated trough.  Less than half the dose infused.  Plan: The dose of Vancomycin will be adjusted to 750 mg IV q24h.  Next dose due 7/27 at 1700.  Height: '5\' 6"'$  (167.6 cm) Weight: 144 lb 10 oz (65.6 kg) IBW/kg (Calculated) : 59.3  Temp (24hrs), Avg:97.3 F (36.3 C), Min:96.5 F (35.8 C), Max:97.8 F (36.6 C)   Recent Labs Lab 03/23/2016 1515 04/08/2016 1851 04/10/16 0345 04/10/16 1700 04/11/16 0410 04/12/16 0355 04/12/16 1631  WBC 22.3*  --  23.0*  --  35.3* 38.4*  --   CREATININE 1.01*  --  1.00 0.93 0.87 0.82  --   LATICACIDVEN 2.3* 1.6  --   --   --   --   --   VANCOTROUGH  --   --   --   --   --   --  25*    Estimated Creatinine Clearance: 53.8 mL/min (by C-G formula based on SCr of 0.82 mg/dL).    Allergies  Allergen Reactions  . Ace Inhibitors Cough  . Codeine Nausea Only  . Morphine And Related     Went crazy  . Penicillins Itching    No other information available at this time 04/06/2016  . Prednisone Other (See Comments)    Head aches   . Spiriva Handihaler [Tiotropium Bromide Monohydrate] Other (See Comments)    Urinary retention     Antimicrobials this admission: 7/23 Merrem >> 7/23 Vanco >>  Dose adjustments this admission: 7/26: VT = 25 on '500mg'$  IV q12h, less than half dose was infused before called RN to stop due to high level, will change to '750mg'$  IV q24h  Microbiology results: 7/23 Trach aspirate: rare budding yeast, MRSA 7/23 BCx: NGTD 7/23 UCx: NGF 7/23 MRSA PCR +  Thank you for allowing pharmacy to be a part of this patient's  care.  [employer], Pharm.D., BCPS Clinical Pharmacist Pager [phone] 04/12/2016 6:12 PM

## 2016-04-12 NOTE — Progress Notes (Signed)
  Subjective: Had BM, hungry, passed swallow for nectar thick yesterday  Objective: Vital signs in last 24 hours: Temp:  [96.8 F (36 C)-97.6 F (36.4 C)] 97.2 F (36.2 C) (07/26 0717) Pulse Rate:  [53-137] 59 (07/26 0600) Resp:  [9-23] 9 (07/26 0600) BP: (95-163)/(49-91) 126/52 (07/26 0600) SpO2:  [98 %-100 %] 100 % (07/26 0600) Weight:  [65.6 kg (144 lb 10 oz)] 65.6 kg (144 lb 10 oz) (07/26 0400) Last BM Date:  (unknown)  Intake/Output from previous day: 07/25 0701 - 07/26 0700 In: 2040.5 [I.V.:1540.5; IV Piggyback:500] Out: 950 [Urine:925; Drains:25] Intake/Output this shift: No intake/output data recorded.  General appearance: cooperative Cardio: irregularly irregular rhythm GI: soft, incisions CDI, active BS, NT  Lab Results:   Recent Labs  04/11/16 0410 04/12/16 0355  WBC 35.3* 38.4*  HGB 9.1* 9.2*  HCT 27.0* 28.8*  PLT 169 230   BMET  Recent Labs  04/11/16 0410 04/12/16 0355  NA 141 142  K 3.8 3.7  CL 112* 113*  CO2 23 23  GLUCOSE 108* 99  BUN 18 23*  CREATININE 0.87 0.82  CALCIUM 7.9* 8.1*   PT/INR  Recent Labs  04/17/2016 1515  LABPROT 14.6  INR 1.12   ABG  Recent Labs  03/23/2016 1432 04/10/16 0319  PHART 7.428 7.436  HCO3 18.3* 18.5*    Studies/Results: Dg Abd Portable 2v  Result Date: 04/10/2016 CLINICAL DATA:  Postop ileus. EXAM: PORTABLE ABDOMEN - 2 VIEW COMPARISON:  CT 02/10/2016 FINDINGS: Surgical drain noted the pelvis. Midline and left lower quadrant skin staples noted. No evidence of bowel obstruction or ileus currently. No free air organomegaly. Leftward scoliosis in the mid lumbar spine. NG tube tip is in the mid stomach. IMPRESSION: No evidence of bowel obstruction or ileus.  No free air. Electronically Signed   By: Rolm Baptise M.D.   On: 04/10/2016 11:04   Anti-infectives: Anti-infectives    Start     Dose/Rate Route Frequency Ordered Stop   04/10/16 0500  meropenem (MERREM) 1 g in sodium chloride 0.9 % 100 mL IVPB      1 g 200 mL/hr over 30 Minutes Intravenous Every 12 hours 03/22/2016 1654     04/10/16 0500  vancomycin (VANCOCIN) 500 mg in sodium chloride 0.9 % 100 mL IVPB     500 mg 100 mL/hr over 60 Minutes Intravenous Every 12 hours 04/06/2016 1654     04/14/2016 1700  meropenem (MERREM) 1 g in sodium chloride 0.9 % 100 mL IVPB     1 g 200 mL/hr over 30 Minutes Intravenous  Once 04/03/2016 1638 03/26/2016 1730   03/18/2016 1700  vancomycin (VANCOCIN) IVPB 1000 mg/200 mL premix  Status:  Discontinued     1,000 mg 200 mL/hr over 60 Minutes Intravenous  Once 03/21/2016 1638 03/24/2016 1641   04/01/2016 1700  vancomycin (VANCOCIN) 1,250 mg in sodium chloride 0.9 % 250 mL IVPB     1,250 mg 166.7 mL/hr over 90 Minutes Intravenous  Once 04/12/2016 1641 03/26/2016 1900      Assessment/Plan: Colostomy takedown 7/18 at [facility] - Dr. Ladona Horns - advance to soft/nectar thick Acute CVAs - suspect thromboembolic, OK for Eliquis from surgical standpoint HCAP - hospitalist service [facility] for tele floor from our standpoint, CIR consult  LOS: 3 days    Anorah Trias E 04/12/2016

## 2016-04-12 NOTE — Progress Notes (Signed)
ANTIBIOTIC CONSULT NOTE - INITIAL  Pharmacy Consult for Vanco/Merreme Indication: HCAP, COPD, abdominal abx coverage  Allergies  Allergen Reactions  . Ace Inhibitors Cough  . Codeine Nausea Only  . Morphine And Related     Went crazy  . Penicillins Itching    No other information available at this time 03/27/2016  . Prednisone Other (See Comments)    Head aches   . Spiriva Handihaler [Tiotropium Bromide Monohydrate] Other (See Comments)    Urinary retention     Patient Measurements: Height: '5\' 6"'$  (167.6 cm) Weight: 144 lb 10 oz (65.6 kg) IBW/kg (Calculated) : 59.3 Adjusted Body Weight:   Vital Signs: Temp: 96.5 F (35.8 C) (07/26 1118) Temp Source: Oral (07/26 1118) BP: 120/71 (07/26 0712) Pulse Rate: 108 (07/26 0712) Intake/Output from previous day: 07/25 0701 - 07/26 0700 In: 2040.5 [I.V.:1540.5; IV Piggyback:500] Out: 950 [Urine:925; Drains:25] Intake/Output from this shift: Total I/O In: -  Out: 135 [Urine:135]  Labs:  Recent Labs  04/10/16 0345 04/10/16 1700 04/11/16 0410 04/12/16 0355  WBC 23.0*  --  35.3* 38.4*  HGB 9.2*  --  9.1* 9.2*  PLT 114*  --  169 230  CREATININE 1.00 0.93 0.87 0.82   Estimated Creatinine Clearance: 53.8 mL/min (by C-G formula based on SCr of 0.82 mg/dL). No results for input(s): VANCOTROUGH, VANCOPEAK, VANCORANDOM, GENTTROUGH, GENTPEAK, GENTRANDOM, TOBRATROUGH, TOBRAPEAK, TOBRARND, AMIKACINPEAK, AMIKACINTROU, AMIKACIN in the last 72 hours.   Microbiology:   Medical History: Past Medical History:  Diagnosis Date  . Bilateral carotid artery stenosis   . COPD (chronic obstructive pulmonary disease) ([place])   . GERD (gastroesophageal reflux disease)   . HTN (hypertension)   . Hypothyroidism   . Non-small cell lung cancer ([place])   . 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 Nelda Marseille wants to keep Merrem for abdominal coverage. - LA 1.6, WBC  22.3>35.3>38.4 (steroids off 7/26, demargination?), Temp now low 96.5. No left shift.   7/23 CXR: Persistent moderate right pleural effusion and underlying opacities favored to represent atelectasis.  7/23 Trach aspirate: rare budding yeast, MRSA 7/23 BCx: NGTD 7/23 UCx: NGF 7/23: MRSA PCR +  7/23 Merrem>> 7/23 Vanco>>  Goal of Therapy:  Vancomycin trough level 15-20 mcg/ml  Plan:  - Change po Synthroid, Pepcid - continue vanc '500mg'$  IV q12h--level this PM with improved Scr - Increase Merrem to 1g q 8hr   Hayslee Casebolt S. Alford Highland, PharmD, BCPS Clinical Staff Pharmacist Pager [phone]  Eilene Ghazi Stillinger 04/12/2016,11:39 AM

## 2016-04-12 NOTE — Progress Notes (Signed)
Physical Therapy Treatment Patient Details Name: Amy [name] MRN: [id number] DOB: 04-06-1941 Today's Date: 04/12/2016    History of Present Illness Pt transferred from [facility] after reversal of colostomy. Post-op with lethargy and new onset afib. 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 Pt intubated until 7/24. PMH - lung CA, COPD, HTN    PT Comments    Pt making steady progress. Continue to recommend CIR for continued therapy.  Follow Up Recommendations  CIR     Equipment Recommendations  Other (comment) (to be assessed)    Recommendations for Other Services       Precautions / Restrictions Precautions Precautions: Fall Restrictions Weight Bearing Restrictions: No    Mobility  Bed Mobility Overal bed mobility: Needs Assistance Bed Mobility: Sit to Supine;Rolling Rolling: Max assist     Sit to supine: Mod assist;+2 for physical assistance   General bed mobility comments: Assist to bring BLE onto bed and for trunk support to control descent onto bed.  Transfers Overall transfer level: Needs assistance Equipment used: Ambulation equipment used Transfers: Sit to/from Stand Sit to Stand: Mod assist;+2 physical assistance         General transfer comment: Assist for boost to stand and to bring hips forward and trunk up. Pt able to achieve full standing position on first sit-stand, but required increased assistance for hip extension on 2nd attempt. VCs for hand placement and sequencing as pt continued to demonstrate anxious behavior during session.   Ambulation/Gait                 Stairs            Wheelchair Mobility    Modified Rankin (Stroke Patients Only)       Balance Overall balance assessment: Needs assistance Sitting-balance support: No upper extremity supported Sitting balance-Leahy Scale: Poor Sitting balance - Comments: Pt unable to sit at edge of chair with back unsupported for more than 2 minutes  then would lean back into recliner. Postural control: Posterior lean Standing balance support: Bilateral upper extremity supported Standing balance-Leahy Scale: Zero Standing balance comment: Stedy and +2 mod A                    Cognition Arousal/Alertness: Awake/alert Behavior During Therapy: Anxious Overall Cognitive Status: Impaired/Different from baseline Area of Impairment: Attention;Memory;Following commands;Safety/judgement;Awareness;Problem solving   Current Attention Level: Selective Memory: Decreased short-term memory Following Commands: Follows one step commands inconsistently Safety/Judgement: Decreased awareness of safety;Decreased awareness of deficits Awareness: Emergent Problem Solving: Slow processing;Difficulty sequencing;Requires verbal cues;Requires tactile cues General Comments: Pt oriented x4, but anxious throughout session (MD thinks this may be due to steroids). Pt with difficulty sequencing through brushing teeth and was easily distracted in high stimuli environment.     Exercises      General Comments General comments (skin integrity, edema, etc.): Pt's son and daughter present and report they think she is cognitively and physically doing much better than yesterday.      Pertinent Vitals/Pain Pain Assessment: No/denies pain    Home Living Family/patient expects to be discharged to:: Inpatient rehab Living Arrangements: Spouse/significant other Available Help at Discharge: Family;Available 24 hours/day Type of Home: House Home Access: Stairs to enter Entrance Stairs-Rails: Right Home Layout: Two level;Able to live on main level with bedroom/bathroom Home Equipment: Walker - 2 wheels      Prior Function Level of Independence: Independent      Comments: Pt required ST-SNF after hospitalization several months ago but had regained  independence and was able to do stairs as well.   PT Goals (current goals can now be found in the care plan  section) Acute Rehab PT Goals Patient Stated Goal: "to get my hair looking better" Progress towards PT goals: Progressing toward goals    Frequency  Min 4X/week    PT Plan Current plan remains appropriate    Co-evaluation PT/OT/SLP Co-Evaluation/Treatment: Yes Reason for Co-Treatment: For patient/therapist safety PT goals addressed during session: Mobility/safety with mobility;Balance OT goals addressed during session: ADL's and self-care;Proper use of Adaptive equipment and DME     End of Session Equipment Utilized During Treatment: Gait belt;Oxygen Activity Tolerance: Patient limited by fatigue Patient left: in bed;with call bell/phone within reach;with family/visitor present     Time: 3888-2800 PT Time Calculation (min) (ACUTE ONLY): 37 min  Charges:  $Therapeutic Activity: 8-22 mins                    G Codes:      Tolbert Matheson May 01, 2016, 2:36 PM [facility] PT [phone]

## 2016-04-12 NOTE — Consult Note (Signed)
Physical Medicine and Rehabilitation Consult   Reason for Consult:  Mental status changes with confusion and weakness Referring Physician: Dr. Grandville Silos.    HPI: Amy [name] is a 75 y.o. 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 She developed respiratory failure requiring intubation and MS changes and was transferred to [facility] on 04/11/2016 for work up.   CXR with pleural effusion, leucocytosis with WBC -22 and noted to be in A fib.  She was placed on broad spectrum antibiotics and treated with narcan with some improvement.  CT head without acute changes. MRI brain showed bilateral cerebral hemisphere and right cerebellar hemisphere suspicious for central emboli.  2D echo with EF 50-55%, left pleural effsion and mild MVR. Carotid dopplers without L-ICA stenosis and unable to visualize Right due to dressings.  She was started on stress dose steriods and has some problems with delirium and sundowning.  FEES done yesterday and patient placed on clear thickened thickened fluids. dypshagia --diet advanced to soft, nectar today.    Review of Systems  Constitutional: Negative for chills and fever.  Respiratory: Positive for shortness of breath.   Cardiovascular: Negative for chest pain.  Gastrointestinal: Positive for abdominal pain.  Neurological: Positive for weakness. Negative for sensory change, focal weakness and headaches.  All other systems reviewed and are negative.     Past Medical History:  Diagnosis Date  . Bilateral carotid artery stenosis   . COPD (chronic obstructive pulmonary disease) ([place])   . GERD (gastroesophageal reflux disease)   . HTN (hypertension)   . Hypothyroidism   . Non-small cell lung cancer ([place])   . OSA (obstructive sleep apnea)    intolerant of CPAP   Past Surgical History:  Procedure Laterality Date  .  APPENDECTOMY     at age 1  . COLOSTOMY TAKEDOWN  04/04/2016  . LUNG LOBECTOMY Right    RUL 1997 NSCLC  . TOTAL ABDOMINAL HYSTERECTOMY W/ BILATERAL SALPINGOOPHORECTOMY    . TRANSVERSE LOOP COLOSTOMY     w/ drainage of abscess for perf diverticular disease 11/2015   No peritnent family history. Social History:  Denies tobacco, alcohol, and drug history. Allergies:  Allergies  Allergen Reactions  . Ace Inhibitors Cough  . Codeine Nausea Only  . Morphine And Related     Went crazy  . Penicillins Itching    No other information available at this time 04/01/2016  . Prednisone Other (See Comments)    Head aches   . Spiriva Handihaler [Tiotropium Bromide Monohydrate] Other (See Comments)    Urinary retention    Medications Prior to Admission  Medication Sig Dispense Refill  . albuterol (PROVENTIL HFA;VENTOLIN HFA) 108 (90 Base) MCG/ACT inhaler Inhale 2 puffs into the lungs every 6 (six) hours as needed for wheezing or shortness of breath.    Marland Kitchen albuterol (PROVENTIL) (2.5 MG/3ML) 0.083% nebulizer solution Take 2.5 mg by nebulization 2 (two) times daily.    Marland Kitchen aspirin EC 81 MG tablet Take 81 mg by mouth daily.    . cholecalciferol (VITAMIN D) 1000 units tablet Take 1,000 Units by mouth daily after breakfast.    . furosemide (LASIX) 20 MG tablet Take 20 mg by mouth daily as needed (swelling).     Marland Kitchen levothyroxine (SYNTHROID, LEVOTHROID) 50 MCG tablet Take 50 mcg by mouth daily after breakfast.    . metoprolol succinate (TOPROL-XL)  25 MG 24 hr tablet Take 25 mg by mouth daily.    . OXYGEN Inhale 2 L into the lungs at bedtime.    . Probiotic Product (PROBIOTIC PO) Take 1 tablet by mouth daily after breakfast.    . ranitidine (ZANTAC) 150 MG tablet Take 150 mg by mouth daily after breakfast.      Home: Home Living Family/patient expects to be discharged to:: Private residence Living Arrangements: Spouse/significant other Available Help at Discharge: Family, Available 24 hours/day Type of  Home: House Home Access: Stairs to enter Technical brewer of Steps: 1 Entrance Stairs-Rails: Right Home Layout: Two level, Able to live on main level with bedroom/bathroom Alternate Level Stairs-Number of Steps: flight Home Equipment: Environmental consultant - 2 wheels  Lives With: Spouse  Functional History: Prior Function Level of Independence: Independent Comments: Pt required ST-SNF after hospitalization several months ago but had regained independence and was able to do stairs as well. Functional Status:  Mobility: Bed Mobility Overal bed mobility: Needs Assistance Bed Mobility: Supine to Sit, Sit to Supine Supine to sit: +2 for physical assistance, Max assist Sit to supine: +2 for physical assistance, Max assist General bed mobility comments: Assist to bring legs off of be and to elevate trunk. Assist to lower trunk and bring legs back up into bed. Transfers Overall transfer level: Needs assistance Equipment used: Ambulation equipment used Transfer via Lift Equipment: Stedy Transfers: Sit to/from Stand Sit to Stand: +2 physical assistance, Max assist General transfer comment: Assist to bring hips and trunk up. Used Stedy and pt able to achieve standing but unable to fully extend hips and leaning posteriorly      ADL:    Cognition: Cognition Overall Cognitive Status: Impaired/Different from baseline Arousal/Alertness: Awake/alert Orientation Level: Oriented to person, Disoriented to place, Disoriented to situation, Disoriented to time Attention: Selective Selective Attention: Impaired Selective Attention Impairment: Verbal basic Memory: Impaired Memory Impairment: Storage deficit, Retrieval deficit, Decreased recall of new information Awareness: Impaired Awareness Impairment: Intellectual impairment, Emergent impairment, Anticipatory impairment Problem Solving: Impaired Problem Solving Impairment: Verbal basic Safety/Judgment: Impaired Cognition Arousal/Alertness:  Awake/alert Behavior During Therapy: Anxious Overall Cognitive Status: Impaired/Different from baseline Area of Impairment: Orientation, Attention, Memory, Following commands, Safety/judgement, Problem solving Orientation Level: Disoriented to, Time Current Attention Level: Sustained Memory: Decreased short-term memory Following Commands: Follows one step commands inconsistently Safety/Judgement: Decreased awareness of safety, Decreased awareness of deficits Problem Solving: Difficulty sequencing, Requires verbal cues, Requires tactile cues  Blood pressure (!) 126/52, pulse (!) 59, temperature 97.2 F (36.2 C), temperature source Oral, resp. rate (!) 9, height '5\' 6"'$  (1.676 m), weight 65.6 kg (144 lb 10 oz), SpO2 100 %. Physical Exam  Nursing note and vitals reviewed. Constitutional: She appears well-developed and well-nourished.  HENT:  Head: Normocephalic and atraumatic.  Mouth/Throat: Oropharynx is clear and moist.  Eyes: Conjunctivae and EOM are normal. Pupils are equal, round, and reactive to light.  Neck: Normal range of motion. Neck supple.  Cardiovascular: An irregularly irregular rhythm present. Tachycardia present.   Respiratory: Stridor present. No accessory muscle usage. She is in respiratory distress. She has decreased breath sounds. She has rhonchi. She exhibits no tenderness.  GI: Soft. Bowel sounds are normal. She exhibits no distension. There is tenderness.  Musculoskeletal: She exhibits no edema or tenderness.  Neurological: She is alert.  Sensation intact to light touch Motor: 4+/5 grossly throughout Alert and Oriented x 2-3 (some confusion)  Skin: Skin is warm and dry.  Psychiatric: She is slowed. Cognition and memory are  impaired.    Results for orders placed or performed during the hospital encounter of 04/13/2016 (from the past 24 hour(s))  Glucose, capillary     Status: Abnormal   Collection Time: 04/11/16  8:27 AM  Result Value Ref Range   Glucose-Capillary  50 (L) 65 - 99 mg/dL  Glucose, capillary     Status: Abnormal   Collection Time: 04/11/16  8:32 AM  Result Value Ref Range   Glucose-Capillary 64 (L) 65 - 99 mg/dL  Glucose, capillary     Status: None   Collection Time: 04/11/16  8:53 AM  Result Value Ref Range   Glucose-Capillary 77 65 - 99 mg/dL   Comment 1 Capillary Specimen    Comment 2 Notify RN   Glucose, capillary     Status: None   Collection Time: 04/11/16 11:13 AM  Result Value Ref Range   Glucose-Capillary 89 65 - 99 mg/dL  Glucose, capillary     Status: Abnormal   Collection Time: 04/11/16  3:20 PM  Result Value Ref Range   Glucose-Capillary 39 (LL) 65 - 99 mg/dL   Comment 1 Capillary Specimen    Comment 2 Notify RN    Comment 3 Repeat Test   Glucose, capillary     Status: None   Collection Time: 04/11/16  3:23 PM  Result Value Ref Range   Glucose-Capillary 81 65 - 99 mg/dL   Comment 1 Capillary Specimen    Comment 2 Notify RN   Glucose, capillary     Status: None   Collection Time: 04/11/16  7:50 PM  Result Value Ref Range   Glucose-Capillary 65 65 - 99 mg/dL   Comment 1 Capillary Specimen    Comment 2 Notify RN    Comment 3 Document in Chart   Glucose, capillary     Status: Abnormal   Collection Time: 04/11/16  8:24 PM  Result Value Ref Range   Glucose-Capillary 102 (H) 65 - 99 mg/dL   Comment 1 Venous Specimen   Glucose, capillary     Status: Abnormal   Collection Time: 04/12/16 12:07 AM  Result Value Ref Range   Glucose-Capillary 101 (H) 65 - 99 mg/dL   Comment 1 Venous Specimen   Comprehensive metabolic panel     Status: Abnormal   Collection Time: 04/12/16  3:55 AM  Result Value Ref Range   Sodium 142 135 - 145 mmol/L   Potassium 3.7 3.5 - 5.1 mmol/L   Chloride 113 (H) 101 - 111 mmol/L   CO2 23 22 - 32 mmol/L   Glucose, Bld 99 65 - 99 mg/dL   BUN 23 (H) 6 - 20 mg/dL   Creatinine, Ser 0.82 0.44 - 1.00 mg/dL   Calcium 8.1 (L) 8.9 - 10.3 mg/dL   Total Protein 4.3 (L) 6.5 - 8.1 g/dL   Albumin 2.0  (L) 3.5 - 5.0 g/dL   AST 166 (H) 15 - 41 U/L   ALT 182 (H) 14 - 54 U/L   Alkaline Phosphatase 93 38 - 126 U/L   Total Bilirubin 1.3 (H) 0.3 - 1.2 mg/dL   GFR calc non Af Amer >60 >60 mL/min   GFR calc Af Amer >60 >60 mL/min   Anion gap 6 5 - 15  CBC     Status: Abnormal   Collection Time: 04/12/16  3:55 AM  Result Value Ref Range   WBC 38.4 (H) 4.0 - 10.5 K/uL   RBC 3.22 (L) 3.87 - 5.11 MIL/uL   Hemoglobin 9.2 (L)  12.0 - 15.0 g/dL   HCT 28.8 (L) 36.0 - 46.0 %   MCV 89.4 78.0 - 100.0 fL   MCH 28.6 26.0 - 34.0 pg   MCHC 31.9 30.0 - 36.0 g/dL   RDW 14.8 11.5 - 15.5 %   Platelets 230 150 - 400 K/uL  Magnesium     Status: Abnormal   Collection Time: 04/12/16  3:55 AM  Result Value Ref Range   Magnesium 2.6 (H) 1.7 - 2.4 mg/dL  Phosphorus     Status: None   Collection Time: 04/12/16  3:55 AM  Result Value Ref Range   Phosphorus 3.0 2.5 - 4.6 mg/dL  Glucose, capillary     Status: Abnormal   Collection Time: 04/12/16  4:16 AM  Result Value Ref Range   Glucose-Capillary 110 (H) 65 - 99 mg/dL   Comment 1 Venous Specimen   Glucose, capillary     Status: Abnormal   Collection Time: 04/12/16  7:12 AM  Result Value Ref Range   Glucose-Capillary 58 (L) 65 - 99 mg/dL   Comment 1 Capillary Specimen    Comment 2 Notify RN   Glucose, capillary     Status: Abnormal   Collection Time: 04/12/16  7:50 AM  Result Value Ref Range   Glucose-Capillary 101 (H) 65 - 99 mg/dL   Comment 1 Venous Specimen    Dg Abd Portable 2v  Result Date: 04/10/2016 CLINICAL DATA:  Postop ileus. EXAM: PORTABLE ABDOMEN - 2 VIEW COMPARISON:  CT 02/10/2016 FINDINGS: Surgical drain noted the pelvis. Midline and left lower quadrant skin staples noted. No evidence of bowel obstruction or ileus currently. No free air organomegaly. Leftward scoliosis in the mid lumbar spine. NG tube tip is in the mid stomach. IMPRESSION: No evidence of bowel obstruction or ileus.  No free air. Electronically Signed   By: Rolm Baptise M.D.    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  Records reviewed and summated above. Stroke: Continue secondary stroke prophylaxis and Risk Factor Modification listed below:   Antiplatelet therapy:   Blood Pressure Management:  Continue current medication with prn's with permisive HTN per primary team Statin Agent:    1. Does the need for close, 24 hr/day medical supervision in concert with the patient's rehab needs make it unreasonable for this patient to be served in a less intensive setting? Yes  2. 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 Due to safety, skin/wound care, disease management, medication administration, pain management and patient education, does the patient require 24 hr/day rehab nursing? Yes 4. Does the patient require coordinated care of a physician, rehab nurse, PT (1-2 hrs/day, 5 days/week), OT (1-2 hrs/day, 5 days/week) and SLP (1-2 hrs/day, 5 days/week) to address physical and functional deficits in the context of the above medical diagnosis(es)? Yes Addressing deficits in the following areas: balance, endurance, locomotion, strength,  transferring, bowel/bladder control, bathing, dressing, toileting, cognition, swallowing and psychosocial support 5. Can the patient actively participate in an intensive therapy program of at least 3 hrs of therapy per day at least 5 days per week? Potentially 6. The potential for patient to make measurable gains while on inpatient rehab is excellent 7. Anticipated functional outcomes upon discharge from inpatient rehab are supervision and min assist  with PT, supervision and min assist with OT, independent and modified independent with SLP. 8. Estimated rehab length of stay to reach the above functional goals is: 16-19 days. 9. Does the patient have adequate social supports and living environment to accommodate these discharge functional goals? Yes 10. Anticipated D/C setting: Home 11. Anticipated post D/C treatments: HH therapy and Home excercise program 12. Overall Rehab/Functional Prognosis: good  RECOMMENDATIONS: This patient's condition is appropriate for continued rehabilitative care in the following setting: CIR once medically stable if pt willing, currently she would like to be closer to home. Patient has agreed to participate in recommended program. Potentially Note that insurance prior authorization may be required for reimbursement for recommended care.  Comment: Rehab Admissions Coordinator to follow up.  Delice Lesch, MD 04/12/2016

## 2016-04-12 NOTE — Progress Notes (Signed)
Occupational Therapy Evaluation Patient Details Name: Amy [name] MRN: [id number] DOB: 1941-05-20 Today's Date: 04/12/2016    History of Present Illness Pt transferred from [facility] after reversal of colostomy. Post-op with lethargy and new onset afib. 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 Pt intubated until 7/24. PMH - lung CA, COPD, HTN   Clinical Impression   PTA, pt was independent with ADLs and mobility. Pt currently presents with global generalized weakness, bilateral UE edema impacting hand strength and fine motor coordination, balance deficits and cognitive impairments including attention, short-term memory, and awareness. Pt currently requires max +2 assist for standing ADLs and mod +2 assist for basic transfers using the [place]. Pt will benefit from continued acute OT to increase independence and safety with ADLs and mobility to allow for safe discharge to the venue listed below. Feel pt is an excellent CIR candidate as she has a very supportive family and great potential to reach more independent level of function.     Follow Up Recommendations  CIR;Supervision/Assistance - 24 hour    Equipment Recommendations  Other (comment) (TBD at next venue)    Recommendations for Other Services       Precautions / Restrictions Precautions Precautions: Fall Restrictions Weight Bearing Restrictions: No      Mobility Bed Mobility Overal bed mobility: Needs Assistance Bed Mobility: Sit to Supine;Rolling Rolling: Max assist     Sit to supine: Mod assist;+2 for physical assistance   General bed mobility comments: Assist to bring BLE onto bed and for trunk support to control descent onto bed.  Transfers Overall transfer level: Needs assistance Equipment used: Ambulation equipment used Transfers: Sit to/from Stand Sit to Stand: Mod assist;+2 physical assistance         General transfer comment: Assist for boost to stand and to bring hips  forward and trunk up. Pt able to achieve full standing position on first sit-stand, but required increased assistance for hip extension on 2nd attempt. VCs for hand placement and sequencing as pt continued to demonstrate anxious behavior during session.     Balance Overall balance assessment: Needs assistance Sitting-balance support: No upper extremity supported;Feet supported Sitting balance-Leahy Scale: Poor Sitting balance - Comments: Pt unable to sit at edge of chair with back unsupported for more than 2 minutes then would lean back into recliner. Postural control: Posterior lean Standing balance support: Bilateral upper extremity supported;During functional activity Standing balance-Leahy Scale: Zero Standing balance comment: mod +2 assist and use of Stedy                            ADL Overall ADL's : Needs assistance/impaired Eating/Feeding: Moderate assistance;Sitting;Cueing for sequencing Eating/Feeding Details (indicate cue type and reason): Decreased grip strength and fine motor coordination; assist to open packages/containers, hold utensils and cup Grooming: Oral care;Minimal assistance;Sitting;Cueing for sequencing Grooming Details (indicate cue type and reason): Cues for initiation and sequencing; assist to unscrew toothpaste cap and squeeze toothpaste onto toothbrush                 Toilet Transfer: Moderate assistance;+2 for physical assistance;+2 for safety/equipment;Cueing for safety;Cueing for sequencing;BSC Amy [name])   Toileting- Clothing Manipulation and Hygiene: Maximal assistance;+2 for physical assistance;+2 for safety/equipment Amy [name])       Functional mobility during ADLs: Moderate assistance;+2 for physical assistance;+2 for safety/equipment Amy [name])       Vision Additional Comments: Did not assess this session   Perception     Praxis  Pertinent Vitals/Pain Pain Assessment: No/denies pain     Hand Dominance Right    Extremity/Trunk Assessment Upper Extremity Assessment Upper Extremity Assessment: RUE deficits/detail;LUE deficits/detail RUE Deficits / Details: overall 3/5 strength; decreased hand strength and impaired fine motor coordination (pt unable to unscrew toothpaste cap or grip toothpaste tuber); no-pitting edema noted increasing distally RUE Coordination: decreased fine motor;decreased gross motor LUE Deficits / Details: overall 3/5 strength; decreased hand strength and impaired fine motor coordination (pt unable to unscrew toothpaste cap or grip toothpaste tuber); no-pitting edema noted increasing distally LUE Coordination: decreased fine motor;decreased gross motor   Lower Extremity Assessment Lower Extremity Assessment: Generalized weakness   Cervical / Trunk Assessment Cervical / Trunk Assessment: Kyphotic   Communication Communication Communication: No difficulties   Cognition Arousal/Alertness: Awake/alert Behavior During Therapy: Anxious Overall Cognitive Status: Impaired/Different from baseline Area of Impairment: Attention;Memory;Following commands;Safety/judgement;Awareness;Problem solving   Current Attention Level: Selective Memory: Decreased short-term memory Following Commands: Follows one step commands inconsistently Safety/Judgement: Decreased awareness of safety;Decreased awareness of deficits Awareness: Emergent Problem Solving: Slow processing;Difficulty sequencing;Requires verbal cues;Requires tactile cues General Comments: Pt oriented x4, but anxious throughout session (MD thinks this may be due to steroids). Pt with difficulty sequencing through brushing teeth and was easily distracted in high stimuli environment.    General Comments       Exercises       Shoulder Instructions      Home Living Family/patient expects to be discharged to:: Inpatient rehab Living Arrangements: Spouse/significant other Available Help at Discharge: Family;Available 24  hours/day Type of Home: House Home Access: Stairs to enter [employer] of Steps: 1 Entrance Stairs-Rails: Right Home Layout: Two level;Able to live on main level with bedroom/bathroom Alternate Level Stairs-Number of Steps: flight             Home Equipment: Environmental consultant - 2 wheels      Lives With: Spouse    Prior Functioning/Environment Level of Independence: Independent        Comments: Pt required ST-SNF after hospitalization several months ago but had regained independence and was able to do stairs as well.    OT Diagnosis: Generalized weakness;Cognitive deficits   OT Problem List: Decreased strength;Decreased activity tolerance;Impaired balance (sitting and/or standing);Decreased coordination;Decreased cognition;Decreased safety awareness;Decreased knowledge of use of DME or AE;Impaired UE functional use;Increased edema   OT Treatment/Interventions: Self-care/ADL training;Therapeutic exercise;DME and/or AE instruction;Therapeutic activities;Neuromuscular education;Cognitive remediation/compensation;Patient/family education;Balance training    OT Goals(Current goals can be found in the care plan section) Acute Rehab OT Goals Patient Stated Goal: "to get my hair looking better" OT Goal Formulation: With patient Time For Goal Achievement: 04/26/16 Potential to Achieve Goals: Good ADL Goals Pt Will Perform Eating: with set-up;sitting Pt Will Perform Grooming: with set-up;sitting Pt Will Perform Upper Body Bathing: with set-up;sitting Pt Will Perform Lower Body Bathing: with mod assist;sit to/from stand Pt Will Transfer to Toilet: with mod assist;ambulating;bedside commode Pt Will Perform Toileting - Clothing Manipulation and hygiene: with mod assist;sit to/from stand  OT Frequency: Min 2X/week   Barriers to D/C:            Co-evaluation PT/OT/SLP Co-Evaluation/Treatment: Yes Reason for Co-Treatment: For patient/therapist safety   OT goals addressed during  session: ADL's and self-care;Proper use of Adaptive equipment and DME      End of Session Equipment Utilized During Treatment: Gait belt;Other (comment) Amy [name]) Nurse Communication: Mobility status  Activity Tolerance: Other (comment) (Patient limited by anxious behavior) Patient left: in bed;with call bell/phone within reach;with bed alarm set;with  family/visitor present;with SCD's reapplied   Time: 5974-7185 OT Time Calculation (min): 38 min Charges:  OT General Charges $OT Visit: 1 Procedure OT Evaluation $OT Eval Moderate Complexity: 1 Procedure OT Treatments $Self Care/Home Management : 8-22 mins G-Codes:    Redmond Baseman, OTR/L Pager: [phone] 04/12/2016, 11:35 AM

## 2016-04-13 ENCOUNTER — Inpatient Hospital Stay (HOSPITAL_COMMUNITY): Payer: Medicare Other

## 2016-04-13 ENCOUNTER — Encounter (HOSPITAL_COMMUNITY): Payer: Self-pay | Admitting: Radiology

## 2016-04-13 DIAGNOSIS — K567 Ileus, unspecified: Secondary | ICD-10-CM

## 2016-04-13 DIAGNOSIS — I6529 Occlusion and stenosis of unspecified carotid artery: Secondary | ICD-10-CM | POA: Diagnosis present

## 2016-04-13 DIAGNOSIS — K9189 Other postprocedural complications and disorders of digestive system: Secondary | ICD-10-CM

## 2016-04-13 DIAGNOSIS — J189 Pneumonia, unspecified organism: Secondary | ICD-10-CM | POA: Diagnosis present

## 2016-04-13 DIAGNOSIS — J90 Pleural effusion, not elsewhere classified: Secondary | ICD-10-CM

## 2016-04-13 DIAGNOSIS — B370 Candidal stomatitis: Secondary | ICD-10-CM | POA: Diagnosis present

## 2016-04-13 LAB — GLUCOSE, CAPILLARY
GLUCOSE-CAPILLARY: 72 mg/dL (ref 65–99)
GLUCOSE-CAPILLARY: 84 mg/dL (ref 65–99)
Glucose-Capillary: 39 mg/dL — CL (ref 65–99)
Glucose-Capillary: 77 mg/dL (ref 65–99)
Glucose-Capillary: 80 mg/dL (ref 65–99)
Glucose-Capillary: 100 mg/dL — ABNORMAL HIGH (ref 65–99)
Glucose-Capillary: 95 mg/dL (ref 65–99)
Glucose-Capillary: 95 mg/dL (ref 65–99)

## 2016-04-13 LAB — CBC WITH DIFFERENTIAL/PLATELET
BASOS PCT: 0 %
Band Neutrophils: 0 %
Basophils Absolute: 0 10*3/uL (ref 0.0–0.1)
Blasts: 0 %
EOS ABS: 0 10*3/uL (ref 0.0–0.7)
EOS PCT: 0 %
HEMATOCRIT: 28.5 % — AB (ref 36.0–46.0)
HEMOGLOBIN: 9.3 g/dL — AB (ref 12.0–15.0)
Lymphocytes Relative: 2 %
Lymphs Abs: 0.9 10*3/uL (ref 0.7–4.0)
MCH: 29.2 pg (ref 26.0–34.0)
MCHC: 32.6 g/dL (ref 30.0–36.0)
MCV: 89.6 fL (ref 78.0–100.0)
MONO ABS: 0.9 10*3/uL (ref 0.1–1.0)
MYELOCYTES: 0 %
Metamyelocytes Relative: 0 %
Monocytes Relative: 2 %
NEUTROS PCT: 96 %
NRBC: 13 /100{WBCs} — AB
Neutro Abs: 41.9 10*3/uL — ABNORMAL HIGH (ref 1.7–7.7)
Platelets: 278 10*3/uL (ref 150–400)
Promyelocytes Absolute: 0 %
RBC: 3.18 MIL/uL — ABNORMAL LOW (ref 3.87–5.11)
RDW: 15.2 % (ref 11.5–15.5)
WBC: 43.7 10*3/uL — ABNORMAL HIGH (ref 4.0–10.5)

## 2016-04-13 LAB — LACTIC ACID, PLASMA
LACTIC ACID, VENOUS: 2.5 mmol/L — AB (ref 0.5–1.9)
Lactic Acid, Venous: 1.3 mmol/L (ref 0.5–1.9)

## 2016-04-13 LAB — HEMOGLOBIN A1C
HEMOGLOBIN A1C: 5.5 % (ref 4.8–5.6)
MEAN PLASMA GLUCOSE: 111 mg/dL

## 2016-04-13 LAB — COMPREHENSIVE METABOLIC PANEL
ALK PHOS: 111 U/L (ref 38–126)
ALT: 159 U/L — AB (ref 14–54)
AST: 123 U/L — AB (ref 15–41)
Albumin: 2.3 g/dL — ABNORMAL LOW (ref 3.5–5.0)
Anion gap: 8 (ref 5–15)
BILIRUBIN TOTAL: 1.5 mg/dL — AB (ref 0.3–1.2)
BUN: 27 mg/dL — AB (ref 6–20)
CALCIUM: 8.3 mg/dL — AB (ref 8.9–10.3)
CO2: 21 mmol/L — ABNORMAL LOW (ref 22–32)
CREATININE: 0.96 mg/dL (ref 0.44–1.00)
Chloride: 114 mmol/L — ABNORMAL HIGH (ref 101–111)
GFR calc Af Amer: >60 mL/min (ref >60)
GFR, EST NON AFRICAN AMERICAN: 56 mL/min — AB (ref >60)
Glucose, Bld: 133 mg/dL — ABNORMAL HIGH (ref 65–99)
POTASSIUM: 3.7 mmol/L (ref 3.5–5.1)
Sodium: 143 mmol/L (ref 135–145)
TOTAL PROTEIN: 4.7 g/dL — AB (ref 6.5–8.1)

## 2016-04-13 LAB — CBC
HEMATOCRIT: 30.4 % — AB (ref 36.0–46.0)
Hemoglobin: 10 g/dL — ABNORMAL LOW (ref 12.0–15.0)
MCH: 29.3 pg (ref 26.0–34.0)
MCHC: 32.9 g/dL (ref 30.0–36.0)
MCV: 89.1 fL (ref 78.0–100.0)
Platelets: 270 10*3/uL (ref 150–400)
RBC: 3.41 MIL/uL — ABNORMAL LOW (ref 3.87–5.11)
RDW: 15.2 % (ref 11.5–15.5)
WBC: 47.1 10*3/uL — AB (ref 4.0–10.5)

## 2016-04-13 LAB — MAGNESIUM: Magnesium: 2.4 mg/dL (ref 1.7–2.4)

## 2016-04-13 MED ORDER — ONDANSETRON HCL 4 MG/2ML IJ SOLN
4.0000 mg | Freq: Four times a day (QID) | INTRAMUSCULAR | Status: DC | PRN
  Administered 2016-04-15: 4 mg via INTRAVENOUS
  Filled 2016-04-13: qty 2

## 2016-04-13 MED ORDER — IPRATROPIUM-ALBUTEROL 0.5-2.5 (3) MG/3ML IN SOLN
3.0000 mL | Freq: Three times a day (TID) | RESPIRATORY_TRACT | Status: DC
  Administered 2016-04-13 – 2016-04-15 (×6): 3 mL via RESPIRATORY_TRACT
  Filled 2016-04-13 (×7): qty 3

## 2016-04-13 MED ORDER — METOPROLOL TARTRATE 5 MG/5ML IV SOLN
10.0000 mg | INTRAVENOUS | Status: AC
  Administered 2016-04-13: 10 mg via INTRAVENOUS

## 2016-04-13 MED ORDER — DEXTROSE-NACL 5-0.9 % IV SOLN
INTRAVENOUS | Status: DC
  Administered 2016-04-14 – 2016-04-15 (×2): via INTRAVENOUS

## 2016-04-13 MED ORDER — FENTANYL CITRATE (PF) 100 MCG/2ML IJ SOLN
INTRAMUSCULAR | Status: AC
  Filled 2016-04-13: qty 2

## 2016-04-13 MED ORDER — SIMETHICONE 80 MG PO CHEW
160.0000 mg | CHEWABLE_TABLET | Freq: Once | ORAL | Status: AC | PRN
  Administered 2016-04-13: 160 mg via ORAL
  Filled 2016-04-13: qty 2

## 2016-04-13 MED ORDER — DIATRIZOATE MEGLUMINE & SODIUM 66-10 % PO SOLN
15.0000 mL | ORAL | Status: AC
  Filled 2016-04-13: qty 30

## 2016-04-13 MED ORDER — INSULIN ASPART 100 UNIT/ML ~~LOC~~ SOLN
0.0000 [IU] | SUBCUTANEOUS | Status: DC
  Administered 2016-04-14 – 2016-04-15 (×3): 1 [IU] via SUBCUTANEOUS

## 2016-04-13 MED ORDER — SODIUM CHLORIDE 0.9 % IV BOLUS (SEPSIS)
500.0000 mL | Freq: Once | INTRAVENOUS | Status: AC
  Administered 2016-04-13: 500 mL via INTRAVENOUS

## 2016-04-13 MED ORDER — IOPAMIDOL (ISOVUE-300) INJECTION 61%
INTRAVENOUS | Status: AC
  Administered 2016-04-13: 100 mL
  Filled 2016-04-13: qty 100

## 2016-04-13 MED ORDER — FENTANYL CITRATE (PF) 100 MCG/2ML IJ SOLN
25.0000 ug | Freq: Once | INTRAMUSCULAR | Status: AC
  Administered 2016-04-13: 25 ug via INTRAVENOUS

## 2016-04-13 MED ORDER — HYDRALAZINE HCL 20 MG/ML IJ SOLN: 5.0000 mg | INTRAMUSCULAR | Status: DC | PRN

## 2016-04-13 MED ORDER — METOPROLOL TARTRATE 5 MG/5ML IV SOLN
5.0000 mg | Freq: Four times a day (QID) | INTRAVENOUS | Status: DC
  Administered 2016-04-14 – 2016-04-15 (×6): 5 mg via INTRAVENOUS
  Filled 2016-04-13 (×6): qty 5

## 2016-04-13 MED ORDER — METOPROLOL TARTRATE 5 MG/5ML IV SOLN
INTRAVENOUS | Status: AC
  Filled 2016-04-13: qty 10

## 2016-04-13 MED ORDER — FLUCONAZOLE IN SODIUM CHLORIDE 200-0.9 MG/100ML-% IV SOLN
200.0000 mg | INTRAVENOUS | Status: DC
  Administered 2016-04-13 – 2016-04-18 (×6): 200 mg via INTRAVENOUS
  Filled 2016-04-13 (×7): qty 100

## 2016-04-13 MED ORDER — IPRATROPIUM-ALBUTEROL 0.5-2.5 (3) MG/3ML IN SOLN: 3.0000 mL | RESPIRATORY_TRACT | Status: DC | PRN

## 2016-04-13 MED ORDER — LORAZEPAM 2 MG/ML IJ SOLN: 0.5000 mg | Freq: Once | INTRAMUSCULAR | Status: DC | PRN

## 2016-04-13 NOTE — Progress Notes (Addendum)
RRRN at bedside. Pt is very restless and moaning. Seems to be in pain. Says she is "gassy". BP and HR high. Labetalol '10mg'$  IV given without much change in HR/BP. PCCM had ordered some hydralazine (I had answered the page-see their note). Fentanyl 12.'5mg'$  given without relief. RRRN says she can not hear bowel sounds.  Tachy in 130-140 range with runs of Afib. Give another '10mg'$  Lopressor now, 68mg IV Fentanyl. Ordered some Ativan which husband will not agree to let her have. Stat KUB. Check labs. Will follow.  Plan verbalized to RRRN, LMagda Paganini  KJKG, NP [facility] Update: KUB with ileus vs SBO. Talked with Dr. TGrandville Silosof surgery who knows the pt. He reviewed her film and suggested NPO except chips/sips and monitor. If vomits, may put NGT down.  BP and HR are down now with Lopressor x 2.  Will follow.  KJKG, NP [facility]

## 2016-04-13 NOTE — Progress Notes (Signed)
2224- Pt resting with eyes closed, husband at bedside. Awaken pt to take PM meds. Performed oral care and noted thick, dark brown secretions resting in patient's throat. Began to suction and patient began to vomit the dark brown sputum as this nurse suctioned it out of her mouth. Pt became very restless and agitated. After several minutes, pt began to relax and stabled. Left out of pt room with husband at bedside.   Minutes later, pt called to use the bedpan. Stated she was full of gas and very uncomfortable. Belly soft, minimal distention. Very hypoactive bowel sounds. BP and HR elevated. Elink notified and provider on call notified by nurse assisting. Rapid Response notified by this nurse. Hydralazine ordered, given and ineffective. Patient very nausea and vomits once more dark, brown emesis. New orders for zofran, Labetalol, Lopressor, Fentanyl '25mg'$  after PRN 12.'5mg'$  given, KUB, blood work.   RRRN contacted provider to give results of KUB (more of ileus than small bowel obstruction), order for gastric tube as anticipated.  Provider called back and stated she consulted with the surgeon's team and decided to not place NG tube at this time and to monitor and downgrade diet to NPO.  Patient is now calm, asleep, VS stable. Husband remains at bedside. Daughter came, but has now left to go home since pt is resting. Family made aware of all orders placed and carried out. Family okay with decision to not place NG tube at this time.  Notified provider of critical Lactic Acid of 2.5. Bolus ordered and infusing at this time.

## 2016-04-13 NOTE — Progress Notes (Signed)
Physical Therapy Treatment Patient Details Name: Amy [name] MRN: [id number] DOB: [birth year] Today's Date: 04/13/2016    History of Present Illness Pt transferred from [facility] after reversal of colostomy. Post-op with lethargy and new onset afib. 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 Pt intubated until 7/24. PMH - lung CA, COPD, HTN    PT Comments    Patient progressing towards PT goals, tolerated pivot and sit <>stand x3 this session with assist. Generalized Le weakness impacting mobility. Will continue to see and progress as tolerated continues to recommend CIR.  Follow Up Recommendations  CIR     Equipment Recommendations  Other (comment) (to be assessed)    Recommendations for Other Services Rehab consult     Precautions / Restrictions Precautions Precautions: Fall Restrictions Weight Bearing Restrictions: No    Mobility  Bed Mobility Overal bed mobility: Needs Assistance Bed Mobility: Sit to Supine;Rolling     Supine to sit: Max assist     General bed mobility comments: patient able to move LEs to EOB, assist to elevate trunk and rotate to EOB moderate to maximal assist  Transfers Overall transfer level: Needs assistance Equipment used: 2 person hand held assist Transfers: Sit to/from [employer] Transfers Sit to Stand: Mod assist;+2 physical assistance Stand pivot transfers: Mod assist;+2 physical assistance       General transfer comment: VCs for hand placement, assist to elevate to standing, increased assist for stability. LEs fatigue very quickly during transitional movements.   Ambulation/Gait                 Stairs            Wheelchair Mobility    Modified Rankin (Stroke Patients Only)       Balance   Sitting-balance support: No upper extremity supported Sitting balance-Leahy Scale: Poor Sitting balance - Comments: required assist to maintain static sitting without support, trunk  fatigues very quickly   Standing balance support: Bilateral upper extremity supported Standing balance-Leahy Scale: Poor Standing balance comment: Reliance on UE support, performed x3 with increased LE fatigue                    Cognition Arousal/Alertness: Awake/alert Behavior During Therapy: Flat affect Overall Cognitive Status: Impaired/Different from baseline Area of Impairment: Attention;Memory;Following commands;Safety/judgement;Awareness;Problem solving   Current Attention Level: Selective Memory: Decreased short-term memory Following Commands: Follows one step commands inconsistently Safety/Judgement: Decreased awareness of safety;Decreased awareness of deficits Awareness: Emergent Problem Solving: Slow processing;Difficulty sequencing;Requires verbal cues;Requires tactile cues      Exercises      General Comments        Pertinent Vitals/Pain Pain Assessment: Faces Faces Pain Scale: Hurts little more Pain Location: abdominal region Pain Descriptors / Indicators: Sore Pain Intervention(s): Limited activity within patient's tolerance;Monitored during session;Repositioned    Home Living                      Prior Function            PT Goals (current goals can now be found in the care plan section) Acute Rehab PT Goals Patient Stated Goal: "to get my hair looking better" PT Goal Formulation: With patient Time For Goal Achievement: 04/25/16 Potential to Achieve Goals: Fair Progress towards PT goals: Progressing toward goals    Frequency  Min 4X/week    PT Plan Current plan remains appropriate    Co-evaluation  End of Session Equipment Utilized During Treatment: Gait belt;Oxygen Activity Tolerance: Patient limited by fatigue Patient left: in chair;with call bell/phone within reach;with chair alarm set;with family/visitor present     Time: 1444-5848 PT Time Calculation (min) (ACUTE ONLY): 20 min  Charges:   $Therapeutic Activity: 8-22 mins                    G CodesDuncan Dull May 08, 2016, 2:10 PM Alben Deeds, [place] DPT  [phone]

## 2016-04-13 NOTE — Progress Notes (Signed)
Inpatient Rehabilitation  Met with patient and spouse to discuss team's recommendation for IP Rehab.  Shared booklets and answered questions.  Patient's spouse would like to discuss the program with their children, but reports likely opting for IP Rehab despite the distance from home.  Per chart review, noted ongoing medical issues at this time.  Plan to follow along for timing of medical readiness, family's decision, and IP Rehab bed availability.  Please call with questions.   Carmelia Roller., CCC/SLP Admission Coordinator  [place]  Cell [phone]

## 2016-04-13 NOTE — Progress Notes (Signed)
ANTIBIOTIC CONSULT NOTE - INITIAL  Pharmacy Consult for fluconazole Indication: oral candidiasis  Allergies  Allergen Reactions  . Ativan [Lorazepam] Other (See Comments)    Confusion and agitation   . Ace Inhibitors Cough  . Codeine Nausea Only  . Morphine And Related     Went crazy  . Penicillins Itching    No other information available at this time 04/07/2016  . Prednisone Other (See Comments)    Head aches   . Spiriva Handihaler [Tiotropium Bromide Monohydrate] Other (See Comments)    Urinary retention     Patient Measurements: Height: '5\' 6"'$  (167.6 cm) Weight: 143 lb 4.8 oz (65 kg) IBW/kg (Calculated) : 59.3 Adjusted Body Weight:   Vital Signs: Temp: 97.5 F (36.4 C) (07/27 1916) Temp Source: Axillary (07/27 1916) BP: 164/70 (07/27 1916) Pulse Rate: 69 (07/27 1916) Intake/Output from previous day: 07/26 0701 - 07/27 0700 In: 1590 [P.O.:60; I.V.:1380; IV Piggyback:150] Out: 390 [Urine:385; Drains:5] Intake/Output from this shift: Total I/O In: -  Out: 200 [Urine:200]  Labs:  Recent Labs  04/11/16 0410 04/12/16 0355 04/13/16 0024 04/13/16 0600  WBC 35.3* 38.4* 47.1* 43.7*  HGB 9.1* 9.2* 10.0* 9.3*  PLT 169 230 270 278  CREATININE 0.87 0.82 0.96  --    Estimated Creatinine Clearance: 45.9 mL/min (by C-G formula based on SCr of 0.96 mg/dL).  Recent Labs  04/12/16 1631  VANCOTROUGH 25*     Microbiology: Recent Results (from the past 720 hour(s))  MRSA PCR Screening     Status: Abnormal   Collection Time: 03/27/2016  2:15 PM  Result Value Ref Range Status   MRSA by PCR POSITIVE (A) NEGATIVE Final    Comment:        The GeneXpert MRSA Assay (FDA approved for NASAL specimens only), is one component of a comprehensive MRSA colonization surveillance program. It is not intended to diagnose MRSA infection nor to guide or monitor treatment for MRSA infections. RESULT CALLED TO, READ BACK BY AND VERIFIED WITH: L.VERNON,RN 04/14/2016 '@1808'$  BY  V.WILKINS   Culture, blood (routine x 2)     Status: None (Preliminary result)   Collection Time: 04/12/2016  3:17 PM  Result Value Ref Range Status   Specimen Description BLOOD RIGHT HAND  Final   Special Requests IN PEDIATRIC BOTTLE .5CC  Final   Culture NO GROWTH 4 DAYS  Final   Report Status PENDING  Incomplete  Urine culture     Status: None   Collection Time: 04/08/2016  3:50 PM  Result Value Ref Range Status   Specimen Description URINE, CATHETERIZED  Final   Special Requests NONE  Final   Culture NO GROWTH  Final   Report Status 04/10/2016 FINAL  Final  Culture, blood (routine x 2)     Status: None (Preliminary result)   Collection Time: 04/04/2016  4:11 PM  Result Value Ref Range Status   Specimen Description BLOOD LEFT FATTY CASTS  Final   Special Requests IN PEDIATRIC BOTTLE .5CC  Final   Culture NO GROWTH 4 DAYS  Final   Report Status PENDING  Incomplete  Culture, respiratory (tracheal aspirate)     Status: None   Collection Time: 04/12/2016  5:08 PM  Result Value Ref Range Status   Specimen Description TRACHEAL ASPIRATE  Final   Special Requests NONE  Final   Gram Stain   Final    ABUNDANT WBC PRESENT, PREDOMINANTLY PMN FEW SQUAMOUS EPITHELIAL CELLS PRESENT ABUNDANT GRAM POSITIVE COCCI IN PAIRS IN CLUSTERS RARE BUDDING  YEAST SEEN    Culture   Final    ABUNDANT METHICILLIN RESISTANT STAPHYLOCOCCUS AUREUS   Report Status 04/12/2016 FINAL  Final   Organism ID, Bacteria METHICILLIN RESISTANT STAPHYLOCOCCUS AUREUS  Final      Susceptibility   Methicillin resistant staphylococcus aureus - MIC*    CIPROFLOXACIN >=8 RESISTANT Resistant     ERYTHROMYCIN >=8 RESISTANT Resistant     GENTAMICIN <=0.5 SENSITIVE Sensitive     OXACILLIN >=4 RESISTANT Resistant     TETRACYCLINE 2 SENSITIVE Sensitive     VANCOMYCIN <=0.5 SENSITIVE Sensitive     TRIMETH/SULFA <=10 SENSITIVE Sensitive     CLINDAMYCIN >=8 RESISTANT Resistant     RIFAMPIN <=0.5 SENSITIVE Sensitive     Inducible  Clindamycin NEGATIVE Sensitive     * ABUNDANT METHICILLIN RESISTANT STAPHYLOCOCCUS AUREUS    Medical History: Past Medical History:  Diagnosis Date  . Bilateral carotid artery stenosis   . COPD (chronic obstructive pulmonary disease) ([place])   . GERD (gastroesophageal reflux disease)   . HTN (hypertension)   . Hypothyroidism   . Non-small cell lung cancer ([place])   . OSA (obstructive sleep apnea)    intolerant of CPAP    Medications:  Scheduled:  . antiseptic oral rinse  7 mL Mouth Rinse q12n4p  . apixaban  5 mg Oral BID  . aspirin  81 mg Oral Daily  . budesonide (PULMICORT) nebulizer solution  0.5 mg Nebulization BID  . chlorhexidine  15 mL Mouth Rinse BID  . famotidine  20 mg Oral BID  . insulin aspart  0-9 Units Subcutaneous TID AC & HS  . ipratropium-albuterol  3 mL Nebulization TID  . levothyroxine  88 mcg Oral QAC breakfast  . meropenem (MERREM) IV  1 g Intravenous Q8H  . metoprolol  5 mg Intravenous Q6H  . mupirocin ointment  1 application Nasal BID  . sodium chloride flush  10-40 mL Intracatheter Q12H  . vancomycin  750 mg Intravenous Q24H   Infusions:  . sodium chloride 60 mL/hr at 04/13/16 1058   Assessment: 75 yo female with oral candidiasis will be started on fluconazole  Goal of Therapy:    Plan:  - Fluconazole 200 mg iv q24h  Helana Macbride, Tsz-Yin 04/13/2016,8:29 PM

## 2016-04-13 NOTE — Progress Notes (Signed)
[facility] Progress Note     Subjective: Sharp abdominal pain/vomiting overnight associated with tachycardia and HTN. Husband states pt was complaining of abdominal pain but the pain seemed unrelated to vomiting - pt had small episode of emesis during suctioning of secretions. Today abdominal pain is improved but still sore. Denies N/V this AM. 1 small BM this AM, first recorded BM per nursing. Stool was dark brown/maroon. hgb 10.   abd film this AM read as SBO vs ileus.  Objective: Vital signs in last 24 hours: Temp:  [96.5 F (35.8 C)-98.1 F (36.7 C)] 97.4 F (36.3 C) (07/27 0731) Pulse Rate:  [48-127] 83 (07/27 0731) Resp:  [11-26] 11 (07/27 0731) BP: (126-182)/(58-123) 152/72 (07/27 0731) SpO2:  [94 %-100 %] 100 % (07/27 0836) Weight:  [65 kg (143 lb 4.8 oz)] 65 kg (143 lb 4.8 oz) (07/27 0500) Last BM Date: 04/13/16  Intake/Output from previous day: 07/26 0701 - 07/27 0700 In: 930 [P.O.:60; I.V.:720; IV Piggyback:150] Out: 390 [Urine:385; Drains:5] Intake/Output this shift: Total I/O In: -  Out: 250 [Urine:250]  PE: Gen:  In and out of sleep but arousable, cooperative Card:  Irregularly irregular rhythm, no M/G/R heard Pulm:  CTABL, no accessory muscle use  Abd: Soft, NT, mild distention, +BS, incisions C/D/I, midline staples in place.  Lab Results:   Recent Labs  04/13/16 0024 04/13/16 0600  WBC 47.1* 43.7*  HGB 10.0* 9.3*  HCT 30.4* 28.5*  PLT 270 278   BMET  Recent Labs  04/12/16 0355 04/13/16 0024  NA 142 143  K 3.7 3.7  CL 113* 114*  CO2 23 21*  GLUCOSE 99 133*  BUN 23* 27*  CREATININE 0.82 0.96  CALCIUM 8.1* 8.3*   PT/INR No results for input(s): LABPROT, INR in the last 72 hours. CMP     Component Value Date/Time   NA 143 04/13/2016 0024   K 3.7 04/13/2016 0024   CL 114 (H) 04/13/2016 0024   CO2 21 (L) 04/13/2016 0024   GLUCOSE 133 (H) 04/13/2016 0024   BUN 27 (H) 04/13/2016 0024   CREATININE 0.96 04/13/2016 0024   CALCIUM 8.3 (L) 04/13/2016 0024   PROT 4.7 (L) 04/13/2016 0024   ALBUMIN 2.3 (L) 04/13/2016 0024   AST 123 (H) 04/13/2016 0024   ALT 159 (H) 04/13/2016 0024   ALKPHOS 111 04/13/2016 0024   BILITOT 1.5 (H) 04/13/2016 0024   GFRNONAA 56 (L) 04/13/2016 0024   GFRAA >60 04/13/2016 0024   Lipase  No results found for: LIPASE  Studies/Results: Dg Chest Port 1 View  Result Date: 04/13/2016 CLINICAL DATA:  Pleural effusion . EXAM: PORTABLE CHEST 1 VIEW COMPARISON:  03/21/2016 FINDINGS: Interim removal of endotracheal tube and NG tube. Right IJ line stable position. Heart size normal. Interim near complete resolution of right lower lobe atelectasis. Persistent left lower lobe infiltrate. Right upper lobe clips and sutures noted. Right apical pleural thickening. Persistent small bilateral pleural effusions. No pneumothorax. IMPRESSION: 1. Interval removal of endotracheal tube and NG tube. Right IJ line in stable position. 2. Interval resolution of right lower lobe atelectasis. Persistent left lower lobe infiltrate. Small bilateral pleural effusions. 3. Postsurgical changes right upper lung . Electronically Signed   By: Marcello Moores  Register   On: 04/13/2016 07:48  Dg Abd Portable 1v  Result Date: 04/13/2016 CLINICAL DATA:  75 year old female with abdominal pain, nausea vomiting. EXAM: PORTABLE ABDOMEN - 1 VIEW COMPARISON:  Abdominal radiograph dated 04/10/2016 FINDINGS: There has been interval removal of the  JP drainage catheter seen over the pelvis on the prior study. Surgical suture line noted within the pelvis. Multiple midline and left lower quadrant cutaneous surgical clips noted. A or multiple dilated air-filled loops of small bowel measuring up to 3.6 cm in diameter most compatible with ileus versus small-bowel obstruction. No definite free air identified. No radiopaque calculi. There is degenerative changes of the spine and scoliosis. No acute fracture. IMPRESSION: Interval development of mildly dilated  air-filled loops of small bowel in the mid abdomen and right lower quadrant compatible with ileus versus small-bowel obstruction. Clinical correlation follow-up recommended. Electronically Signed   By: Anner Crete M.D.   On: 04/13/2016 00:33   Anti-infectives: Anti-infectives    Start     Dose/Rate Route Frequency Ordered Stop   04/13/16 1700  vancomycin (VANCOCIN) IVPB 750 mg/150 ml premix     750 mg 150 mL/hr over 60 Minutes Intravenous Every 24 hours 04/12/16 1814     04/12/16 2000  fluconazole (DIFLUCAN) tablet 200 mg     200 mg Oral Daily 04/12/16 1758     04/12/16 1400  meropenem (MERREM) 1 g in sodium chloride 0.9 % 100 mL IVPB     1 g 200 mL/hr over 30 Minutes Intravenous Every 8 hours 04/12/16 1150     04/10/16 0500  meropenem (MERREM) 1 g in sodium chloride 0.9 % 100 mL IVPB  Status:  Discontinued     1 g 200 mL/hr over 30 Minutes Intravenous Every 12 hours 03/20/2016 1654 04/12/16 1150   04/10/16 0500  vancomycin (VANCOCIN) 500 mg in sodium chloride 0.9 % 100 mL IVPB  Status:  Discontinued     500 mg 100 mL/hr over 60 Minutes Intravenous Every 12 hours 04/12/2016 1654 04/12/16 1814   03/31/2016 1700  meropenem (MERREM) 1 g in sodium chloride 0.9 % 100 mL IVPB     1 g 200 mL/hr over 30 Minutes Intravenous  Once 03/30/2016 1638 03/24/2016 1730   03/22/2016 1700  vancomycin (VANCOCIN) IVPB 1000 mg/200 mL premix  Status:  Discontinued     1,000 mg 200 mL/hr over 60 Minutes Intravenous  Once 04/10/2016 1638 04/01/2016 1641   03/20/2016 1700  vancomycin (VANCOCIN) 1,250 mg in sodium chloride 0.9 % 250 mL IVPB     1,250 mg 166.7 mL/hr over 90 Minutes Intravenous  Once 04/01/2016 1641 03/27/2016 1900     Assessment/Plan Colostomy takedown 7/18 at [facility] - Dr. Ladona Horns - JP drain removed 7/26 - swallow study 7/26 - for soft/nectar thick diet - new abdominal pain and vomiting - abd film 7/27 - SBO vs ileus, hold NG for now - LFTs improving, following   Acute CVAs - suspect  thromboembolic, OK for Eliquis from surgical standpoint [facility] - hospitalist service Dispo - OK for tele floor from our standpoint  Appreciate CIR consult, they are following pt for medical readiness for IP rehab.  FEN: NPO, bowel rest ID: vancomycin per pharmacy dosing CT abd/pelvis to r/o post-op intra-abdominal infection   LOS: 3 days   LOS: 4 days    Jill Alexanders , [facility] 04/13/2016, 11:06 AM Pager: [phone] Consults: [phone] Mon-Fri 7:00 am-4:30 pm Sat-Sun 7:00 am-11:30 am

## 2016-04-13 NOTE — Progress Notes (Signed)
PROGRESS NOTE    Amy [name]  IRC:[id number] DOB: 11-07-1940 DOA: 03/27/2016 PCP: No primary care provider on file.   Brief Narrative:  75 year old WF PMHx Bilateral carotid artery stenosis; COPD, HTN, hypothyroidism, Non-small cell lung cancer S/P Right Lung Lobectomy , OSA  Initially admitted to [facility] where she underwent an elective loop colostomy takedown on 7/18. Her immediate post-op course was c/b lethargy and actually required narcan in PACU. She never really improved w/ hospital notes continuing to raise concern for altered mental status and difficulty to arouse. She ultimately required emergent intubation for hypercarbia (CO2 52) and inability to protect airway. She was transferred per family request on 7/23 for further evaluation and care.   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   History of CVA (cerebrovascular accident)   COPD exacerbation (HCC)   OSA (obstructive sleep apnea)   Supplemental oxygen dependent   Paroxysmal atrial fibrillation (HCC)   Dysphagia, post-stroke   Hx of colostomy   Tachycardia   Atrial fibrillation with RVR (HCC)   Acute blood loss anemia   Leukocytosis   HCAP (healthcare-associated pneumonia)   Carotid artery stenosis   Oral candidiasis   Ileus, postoperative   Pleural effusion   Acute Hypercarbic respiratory failure (PCO2 52) -Resolved  COPD exacerbation/ MRSA positive HCAP/sepsis unspecified organism -Extubated 7/24 -Budesonide nebulizer  BID -DuoNeb -Continue current antibiotic -Titrate O2 to maintain SPO2 89-93% -Flutter valve -Mucinex DM BID when patient able to take PO  H/o OSA (intolerant of CPAP)_  H/o NSCLC w/ RUL lobectomy (per records in remission)    AF with (RVR) -Aspirin 81 mg  -Eliquis 5 mg   BID  HTN  -Patient now NPO discontinue PO Cardizem -Metoprolol IV 5 mg QID -Hydralazine IV PRN  Troponin elevation (stable)  Bilateral carotid artery stenosis  -Consider statin for secondary stroke prevention once liver enzymes stabilize    7/14 S/P colostomy take-down (w/ remote h/o diverticular disease) at [facility] hospital   Post-op ileus vs SBO -KUB positive for ileus -Patient nowNPO -Care per Surgery  Anemia -Chronic disease? -Obtain anemia panel  Acute and early subacute involving bilateral cerebral hemispheres and right cerebellar hemisphere/ Cardiothrombotic CVA -Stroke team believed secondary to new onset A. Fib -On Eliquis  Oral candidiasis -Diflucan per pharmacy 7 days  Hypothyroidism  -7/23 TSH= 28.2 -New onset A. fib could certainly be secondary to hypo-or hyperthyroidism. -Synthroid increased to 88 g daily  Acute Encephalopathy: - suspect that this is multi-factorial-->cva and sedation +/- infection.  -EEG diffuse slowing, no seizure  -Neurology following- -PT/OT; recommends CIR -SLP cognitive evaluation    DVT prophylaxis: Eliquis Code Status: Full Family Communication: None Disposition Plan: CIR   Consultants:  Dr Garvin Fila,. Neurology stroke team Dr.Burke [place] Silos surgery Dr.Wesam Kathryne [facility] CM Dr. Jamse Arn physical medicine and rehabilitation    Procedures/Significant Events:  CT head (at [facility]): negative  MR brain  7/23>>> Bilateral cerebral hemispheres and tight cerebellar hemisphere patchy acute and  early subacute infarcts. No mass effect or hemorrhage. Central thromboembolic source is suspected.  EEG 7/23>>> Abnormal due to moderate diffuse slowing of the waking background. No seizure or epiletiform discharges.  ECHO 7/24 >> EF 50-55%, lateral wall appears hypokinetic, systolic function normal, mild MR regurg Carotid US >>  Loop colostomy takedown 7/18 7/18-7/22: progressively lethargic  7/23  transferred to [facility] s/p getting intubated for AMS and hypercarbia. Surgical service consulted at [facility] on arrival  7/24 on CCB gtt for af. But fully awake and following commands. Extubated. 7/25 SLP evaluation for diet, ASA started, FEES    Cultures Sputum 7/23: MRSA positive MRSA screen 7/22: positive  UC 7/23>>> Negative BC 7/23>> NGTD  Antimicrobials: Meropenem 7/23>> Vancomycin 7/23>>>    Devices    LINES / TUBES:  ?? Right CVL>> OETT 7/23>>>7/24    Continuous Infusions: . sodium chloride 60 mL/hr at 04/13/16 1058         Objective: Vitals:   04/13/16 1215 04/13/16 1439 04/13/16 1916 04/13/16 1934  BP:  (!) 178/62 (!) 164/70   Pulse: (!) 38 89 69   Resp: '13 14 14   '$ Temp: (!) 96 F (35.6 C) 97.4 F (36.3 C) 97.5 F (36.4 C)   TempSrc: Axillary Oral Axillary   SpO2: 96% 100% 100% 100%  Weight:      Height:        Intake/Output Summary (Last 24 hours) at 04/13/16 2032 Last data filed at 04/13/16 1027  Gross per 24 hour  Intake             1335 ml  Output             1075 ml  Net              260 ml   Filed Weights   04/11/16 0422 04/12/16 0400 04/13/16 0500  Weight: 63.1 kg (139 lb 1.8 oz) 65.6 kg (144 lb 10 oz) 65 kg (143 lb 4.8 oz)    Examination:  General: A/O 4, some confusion on recent events, No acute respiratory distress Eyes: negative scleral hemorrhage, negative anisocoria, negative icterus ENT: Negative Runny nose, negative gingival bleeding, Neck:  Negative scars, masses, torticollis, lymphadenopathy, JVD, right IJ CVL covering clean negative sign of infection Lungs: Clear to auscultation bilaterally without wheezes or crackles Cardiovascular: Irregular irregular rhythm and rate, without murmur gallop or rub normal S1 and S2 Abdomen: negative abdominal pain, nondistended, positive soft, bowel sounds, no rebound, no ascites, no appreciable mass, midline exploratory laparoscopy incision with staples negative sign of infection, LLQ  incision covered and clean did not take down dressing,  Extremities: No significant cyanosis, clubbing, or edema bilateral lower extremities Skin: Negative rashes, lesions, ulcers Psychiatric:  Negative depression, negative anxiety, negative fatigue, negative mania  Central nervous system:  Cranial nerves II through XII intact, tongue/uvula midline, all extremities muscle strength 5/5, sensation intact throughout,  negative dysarthria, negative expressive aphasia, negative receptive aphasia.  .     Data Reviewed: Care during the described time interval was provided by me .  I have reviewed this patient's available data, including medical history, events of note, physical examination, and all test results as part of my evaluation. I have personally reviewed and interpreted all radiology studies.  CBC:  Recent Labs Lab 03/25/2016 1515 04/10/16 0345 04/11/16 0410 04/12/16 0355 04/13/16 0024 04/13/16 0600  WBC 22.3* 23.0* 35.3* 38.4* 47.1* 43.7*  NEUTROABS 20.1*  --   --   --   --  41.9*  HGB 10.2* 9.2* 9.1* 9.2* 10.0* 9.3*  HCT 31.2* 28.8* 27.0* 28.8* 30.4* 28.5*  MCV 90.2 89.7 87.4 89.4 89.1 89.6  PLT 86* 114* 169 230 270 253   Basic Metabolic Panel:  Recent Labs Lab 04/14/2016 1515 04/10/16 0345 04/10/16 1700 04/11/16 0410 04/12/16 0355 04/13/16  0024 04/13/16 0600  NA 139 137 137 141 142 143  --   K 4.0 2.6* 3.8 3.8 3.7 3.7  --   CL 111 108 109 112* 113* 114*  --   CO2 22 20* 20* 23 23 21*  --   GLUCOSE 95 216* 124* 108* 99 133*  --   BUN '17 16 17 18 '$ 23* 27*  --   CREATININE 1.01* 1.00 0.93 0.87 0.82 0.96  --   CALCIUM 7.9* 7.7* 7.8* 7.9* 8.1* 8.3*  --   MG 1.7 1.4*  --  2.8* 2.6*  --  2.4  PHOS 1.3* 2.0*  --  3.3 3.0  --   --    GFR: Estimated Creatinine Clearance: 45.9 mL/min (by C-G formula based on SCr of 0.96 mg/dL). Liver Function Tests:  Recent Labs Lab 04/06/2016 1515 04/11/16 0410 04/12/16 0355 04/13/16 0024  AST 50* 257* 166* 123*  ALT 36 186* 182*  159*  ALKPHOS 96 92 93 111  BILITOT 1.3* 1.2 1.3* 1.5*  PROT 4.4* 4.3* 4.3* 4.7*  ALBUMIN 2.0* 2.0* 2.0* 2.3*   No results for input(s): LIPASE, AMYLASE in the last 168 hours. No results for input(s): AMMONIA in the last 168 hours. Coagulation Profile:  Recent Labs Lab 04/03/2016 1515  INR 1.12   Cardiac Enzymes:  Recent Labs Lab 04/06/2016 1515 04/13/2016 2140 04/10/16 0430  CKTOTAL 47  --   --   TROPONINI 1.95* 0.63* 0.64*   BNP (last 3 results) No results for input(s): PROBNP in the last 8760 hours. HbA1C:  Recent Labs  04/12/16 0355  HGBA1C 5.5   CBG:  Recent Labs Lab 04/12/16 2242 04/13/16 0851 04/13/16 0853 04/13/16 1211 04/13/16 1359  GLUCAP 80 95 100* 95 84   Lipid Profile:  Recent Labs  04/11/16 0410  CHOL 129  HDL 41  LDLCALC 68  TRIG 98  CHOLHDL 3.1   Thyroid Function Tests: No results for input(s): TSH, T4TOTAL, FREET4, T3FREE, THYROIDAB in the last 72 hours. Anemia Panel: No results for input(s): VITAMINB12, FOLATE, FERRITIN, TIBC, IRON, RETICCTPCT in the last 72 hours. Urine analysis: No results found for: COLORURINE, APPEARANCEUR, LABSPEC, [place], GLUCOSEU, HGBUR, BILIRUBINUR, KETONESUR, PROTEINUR, UROBILINOGEN, NITRITE, LEUKOCYTESUR Sepsis Labs: '@LABRCNTIP'$ (procalcitonin:4,lacticidven:4)  ) Recent Results (from the past 240 hour(s))  MRSA PCR Screening     Status: Abnormal   Collection Time: 04/14/2016  2:15 PM  Result Value Ref Range Status   MRSA by PCR POSITIVE (A) NEGATIVE Final    Comment:        The GeneXpert MRSA Assay (FDA approved for NASAL specimens only), is one component of a comprehensive MRSA colonization surveillance program. It is not intended to diagnose MRSA infection nor to guide or monitor treatment for MRSA infections. RESULT CALLED TO, READ BACK BY AND VERIFIED WITH: L.VERNON,RN 04/14/2016 '@1808'$  BY V.WILKINS   Culture, blood (routine x 2)     Status: None (Preliminary result)   Collection Time: 03/30/2016   3:17 PM  Result Value Ref Range Status   Specimen Description BLOOD RIGHT HAND  Final   Special Requests IN PEDIATRIC BOTTLE .5CC  Final   Culture NO GROWTH 4 DAYS  Final   Report Status PENDING  Incomplete  Urine culture     Status: None   Collection Time: 03/28/2016  3:50 PM  Result Value Ref Range Status   Specimen Description URINE, CATHETERIZED  Final   Special Requests NONE  Final   Culture NO GROWTH  Final   Report Status  04/10/2016 FINAL  Final  Culture, blood (routine x 2)     Status: None (Preliminary result)   Collection Time: 04/01/2016  4:11 PM  Result Value Ref Range Status   Specimen Description BLOOD LEFT FATTY CASTS  Final   Special Requests IN PEDIATRIC BOTTLE .5CC  Final   Culture NO GROWTH 4 DAYS  Final   Report Status PENDING  Incomplete  Culture, respiratory (tracheal aspirate)     Status: None   Collection Time: 03/22/2016  5:08 PM  Result Value Ref Range Status   Specimen Description TRACHEAL ASPIRATE  Final   Special Requests NONE  Final   Gram Stain   Final    ABUNDANT WBC PRESENT, PREDOMINANTLY PMN FEW SQUAMOUS EPITHELIAL CELLS PRESENT ABUNDANT GRAM POSITIVE COCCI IN PAIRS IN CLUSTERS RARE BUDDING YEAST SEEN    Culture   Final    ABUNDANT METHICILLIN RESISTANT STAPHYLOCOCCUS AUREUS   Report Status 04/12/2016 FINAL  Final   Organism ID, Bacteria METHICILLIN RESISTANT STAPHYLOCOCCUS AUREUS  Final      Susceptibility   Methicillin resistant staphylococcus aureus - MIC*    CIPROFLOXACIN >=8 RESISTANT Resistant     ERYTHROMYCIN >=8 RESISTANT Resistant     GENTAMICIN <=0.5 SENSITIVE Sensitive     OXACILLIN >=4 RESISTANT Resistant     TETRACYCLINE 2 SENSITIVE Sensitive     VANCOMYCIN <=0.5 SENSITIVE Sensitive     TRIMETH/SULFA <=10 SENSITIVE Sensitive     CLINDAMYCIN >=8 RESISTANT Resistant     RIFAMPIN <=0.5 SENSITIVE Sensitive     Inducible Clindamycin NEGATIVE Sensitive     * ABUNDANT METHICILLIN RESISTANT STAPHYLOCOCCUS AUREUS         Radiology  Studies: Dg Chest Port 1 View  Result Date: 04/13/2016 CLINICAL DATA:  Pleural effusion . EXAM: PORTABLE CHEST 1 VIEW COMPARISON:  04/08/2016 FINDINGS: Interim removal of endotracheal tube and NG tube. Right IJ line stable position. Heart size normal. Interim near complete resolution of right lower lobe atelectasis. Persistent left lower lobe infiltrate. Right upper lobe clips and sutures noted. Right apical pleural thickening. Persistent small bilateral pleural effusions. No pneumothorax. IMPRESSION: 1. Interval removal of endotracheal tube and NG tube. Right IJ line in stable position. 2. Interval resolution of right lower lobe atelectasis. Persistent left lower lobe infiltrate. Small bilateral pleural effusions. 3. Postsurgical changes right upper lung . Electronically Signed   By: Marcello Moores  Register   On: 04/13/2016 07:48  Dg Abd Portable 1v  Result Date: 04/13/2016 CLINICAL DATA:  75 year old female with abdominal pain, nausea vomiting. EXAM: PORTABLE ABDOMEN - 1 VIEW COMPARISON:  Abdominal radiograph dated 04/10/2016 FINDINGS: There has been interval removal of the JP drainage catheter seen over the pelvis on the prior study. Surgical suture line noted within the pelvis. Multiple midline and left lower quadrant cutaneous surgical clips noted. A or multiple dilated air-filled loops of small bowel measuring up to 3.6 cm in diameter most compatible with ileus versus small-bowel obstruction. No definite free air identified. No radiopaque calculi. There is degenerative changes of the spine and scoliosis. No acute fracture. IMPRESSION: Interval development of mildly dilated air-filled loops of small bowel in the mid abdomen and right lower quadrant compatible with ileus versus small-bowel obstruction. Clinical correlation follow-up recommended. Electronically Signed   By: Anner Crete M.D.   On: 04/13/2016 00:33       Scheduled Meds: . antiseptic oral rinse  7 mL Mouth Rinse q12n4p  . apixaban  5  mg Oral BID  . aspirin  81 mg Oral  Daily  . budesonide (PULMICORT) nebulizer solution  0.5 mg Nebulization BID  . chlorhexidine  15 mL Mouth Rinse BID  . famotidine  20 mg Oral BID  . insulin aspart  0-9 Units Subcutaneous TID AC & HS  . ipratropium-albuterol  3 mL Nebulization TID  . levothyroxine  88 mcg Oral QAC breakfast  . meropenem (MERREM) IV  1 g Intravenous Q8H  . metoprolol  5 mg Intravenous Q6H  . mupirocin ointment  1 application Nasal BID  . sodium chloride flush  10-40 mL Intracatheter Q12H  . vancomycin  750 mg Intravenous Q24H   Continuous Infusions: . sodium chloride 60 mL/hr at 04/13/16 1058     LOS: 4 days    Time spent: 40 minutes    Lenya Sterne, Geraldo Docker, MD [facility] Pager [phone]   If 7PM-7AM, please contact night-coverage [email or web address] Password [facility] 04/13/2016, 8:32 PM

## 2016-04-13 NOTE — Significant Event (Signed)
Rapid Response Event Note RN called for abd discomfort, tachycardic, HTN, restless, vomiting.   Overview: Time Called: 2344 Arrival Time: 2347 Event Type: Cardiac, Other (Comment) (abd pain/discomfort )  Initial Focused Assessment: Pt warm and dry, abd soft, mild distention, non tender to touch. Pt's HR irregular 130-140's (Hx of A-Fib), BP 182/123, RR 26, 100% 2L. Pt belching, restless, and anxious, vomited a small amount of brown colored emesis. No BS on auscultation. Lungs clear throughout.  Forrest Moron NP notified, new orders given and completed.     Interventions: Pt given Zofran 4 mg, Labetalol 10 mg, Lopressor 10 mg, Fentanyl 12.5 mg dose and additional 25 mg dose. KUB and blood work ordered and completed. KUB resulted ileus VS SBO. Repositioned in the bed. BP 143/84, HR 73, RR 18, 100 2L  Pt resting with spouse and daughter at bedside.   Plan of Care (if not transferred):  NPO status ordered, if patient vomits may place NGT per Dr. Grandville Silos.  Plan of care discussed with [place] RN    Event Summary: Name of Physician Notified: Forrest Moron NP  at 2348    at    Outcome: Stayed in room and stabalized     [place], [place]

## 2016-04-13 NOTE — Progress Notes (Signed)
SLP Cancellation Note  Patient Details Name: Amy [name] MRN: [id number] DOB: 1940/10/22   Cancelled treatment:       Reason Eval/Treat Not Completed: Medical issues which prohibited therapy. Per RN/chart review, pt with concern for ileus versus SBO. Will f/u as able.   [name] 04/13/2016, 11:59 AM  [name], M.A. CCC-SLP [phone]

## 2016-04-14 DIAGNOSIS — K559 Vascular disorder of intestine, unspecified: Secondary | ICD-10-CM | POA: Diagnosis present

## 2016-04-14 DIAGNOSIS — R1013 Epigastric pain: Secondary | ICD-10-CM

## 2016-04-14 DIAGNOSIS — K55059 Acute (reversible) ischemia of intestine, part and extent unspecified: Secondary | ICD-10-CM

## 2016-04-14 DIAGNOSIS — J15212 Pneumonia due to Methicillin resistant Staphylococcus aureus: Secondary | ICD-10-CM | POA: Diagnosis present

## 2016-04-14 DIAGNOSIS — R109 Unspecified abdominal pain: Secondary | ICD-10-CM

## 2016-04-14 LAB — COMPREHENSIVE METABOLIC PANEL
ALK PHOS: 102 U/L (ref 38–126)
ALT: 200 U/L — ABNORMAL HIGH (ref 14–54)
ANION GAP: 7 (ref 5–15)
AST: 260 U/L — ABNORMAL HIGH (ref 15–41)
Albumin: 2.1 g/dL — ABNORMAL LOW (ref 3.5–5.0)
BILIRUBIN TOTAL: 1.7 mg/dL — AB (ref 0.3–1.2)
BUN: 28 mg/dL — ABNORMAL HIGH (ref 6–20)
CALCIUM: 8.3 mg/dL — AB (ref 8.9–10.3)
CO2: 25 mmol/L (ref 22–32)
Chloride: 115 mmol/L — ABNORMAL HIGH (ref 101–111)
Creatinine, Ser: 0.79 mg/dL (ref 0.44–1.00)
GFR calc non Af Amer: >60 mL/min (ref >60)
Glucose, Bld: 119 mg/dL — ABNORMAL HIGH (ref 65–99)
Potassium: 3.2 mmol/L — ABNORMAL LOW (ref 3.5–5.1)
SODIUM: 147 mmol/L — AB (ref 135–145)
TOTAL PROTEIN: 4.2 g/dL — AB (ref 6.5–8.1)

## 2016-04-14 LAB — LACTIC ACID, PLASMA
LACTIC ACID, VENOUS: 1.4 mmol/L (ref 0.5–1.9)
Lactic Acid, Venous: 1.3 mmol/L (ref 0.5–1.9)
Lactic Acid, Venous: 1.4 mmol/L (ref 0.5–1.9)

## 2016-04-14 LAB — IRON AND TIBC
Iron: 23 ug/dL — ABNORMAL LOW (ref 28–170)
Saturation Ratios: 10 % — ABNORMAL LOW (ref 10.4–31.8)
TIBC: 228 ug/dL — ABNORMAL LOW (ref 250–450)
UIBC: 205 ug/dL

## 2016-04-14 LAB — CULTURE, BLOOD (ROUTINE X 2)
CULTURE: NO GROWTH
Culture: NO GROWTH

## 2016-04-14 LAB — BLOOD GAS, ARTERIAL
Acid-base deficit: 1.3 mmol/L (ref 0.0–2.0)
BICARBONATE: 22.4 meq/L (ref 20.0–24.0)
Drawn by: 280981
FIO2: 0.21
O2 Saturation: 93.7 %
PATIENT TEMPERATURE: 98.6
PH ART: 7.437 (ref 7.350–7.450)
TCO2: 23.4 mmol/L (ref 0–100)
pCO2 arterial: 33.7 mmHg — ABNORMAL LOW (ref 35.0–45.0)
pO2, Arterial: 72.2 mmHg — ABNORMAL LOW (ref 80.0–100.0)

## 2016-04-14 LAB — CBC WITH DIFFERENTIAL/PLATELET
Basophils Absolute: 0 10*3/uL (ref 0.0–0.1)
Basophils Relative: 0 %
EOS ABS: 0 10*3/uL (ref 0.0–0.7)
Eosinophils Relative: 0 %
HEMATOCRIT: 29.9 % — AB (ref 36.0–46.0)
Hemoglobin: 9.7 g/dL — ABNORMAL LOW (ref 12.0–15.0)
Lymphocytes Relative: 1 %
Lymphs Abs: 0.7 10*3/uL (ref 0.7–4.0)
MCH: 29.5 pg (ref 26.0–34.0)
MCHC: 32.4 g/dL (ref 30.0–36.0)
MCV: 90.9 fL (ref 78.0–100.0)
MONO ABS: 2 10*3/uL — AB (ref 0.1–1.0)
Monocytes Relative: 3 %
NEUTROS PCT: 96 %
Neutro Abs: 64.5 10*3/uL — ABNORMAL HIGH (ref 1.7–7.7)
Platelets: 287 10*3/uL (ref 150–400)
RBC: 3.29 MIL/uL — AB (ref 3.87–5.11)
RDW: 15.5 % (ref 11.5–15.5)
WBC: 67.2 10*3/uL — AB (ref 4.0–10.5)

## 2016-04-14 LAB — APTT: aPTT: 37 seconds — ABNORMAL HIGH (ref 24–36)

## 2016-04-14 LAB — GLUCOSE, CAPILLARY
GLUCOSE-CAPILLARY: 117 mg/dL — AB (ref 65–99)
GLUCOSE-CAPILLARY: 120 mg/dL — AB (ref 65–99)
GLUCOSE-CAPILLARY: 137 mg/dL — AB (ref 65–99)
GLUCOSE-CAPILLARY: 94 mg/dL (ref 65–99)
Glucose-Capillary: 81 mg/dL (ref 65–99)
Glucose-Capillary: 123 mg/dL — ABNORMAL HIGH (ref 65–99)

## 2016-04-14 LAB — RETICULOCYTES
RBC.: 3.29 MIL/uL — AB (ref 3.87–5.11)
RETIC COUNT ABSOLUTE: 157.9 10*3/uL (ref 19.0–186.0)
Retic Ct Pct: 4.8 % — ABNORMAL HIGH (ref 0.4–3.1)

## 2016-04-14 LAB — PREPARE RBC (CROSSMATCH)

## 2016-04-14 LAB — LACTATE DEHYDROGENASE: LDH: 559 U/L — ABNORMAL HIGH (ref 98–192)

## 2016-04-14 LAB — TSH: TSH: 13.43 u[IU]/mL — AB (ref 0.350–4.500)

## 2016-04-14 LAB — MAGNESIUM: Magnesium: 2.4 mg/dL (ref 1.7–2.4)

## 2016-04-14 LAB — FERRITIN: Ferritin: 415 ng/mL — ABNORMAL HIGH (ref 11–307)

## 2016-04-14 LAB — LIPASE, BLOOD: Lipase: 32 U/L (ref 11–51)

## 2016-04-14 LAB — FOLATE: FOLATE: 12.5 ng/mL (ref >5.9)

## 2016-04-14 LAB — HEPARIN LEVEL (UNFRACTIONATED): Heparin Unfractionated: >2.2 IU/mL — ABNORMAL HIGH (ref 0.30–0.70)

## 2016-04-14 LAB — VITAMIN B12: VITAMIN B 12: 1206 pg/mL — AB (ref 180–914)

## 2016-04-14 MED ORDER — HEPARIN (PORCINE) IN NACL 100-0.45 UNIT/ML-% IJ SOLN: 800.0000 [IU]/h | INTRAMUSCULAR | Status: DC

## 2016-04-14 MED ORDER — SODIUM CHLORIDE 0.9 % IV SOLN: Freq: Once | INTRAVENOUS | Status: DC

## 2016-04-14 MED ORDER — POTASSIUM CHLORIDE 10 MEQ/100ML IV SOLN
10.0000 meq | INTRAVENOUS | Status: AC
  Administered 2016-04-14 (×2): 10 meq via INTRAVENOUS

## 2016-04-14 MED ORDER — HEPARIN (PORCINE) IN NACL 100-0.45 UNIT/ML-% IJ SOLN
800.0000 [IU]/h | INTRAMUSCULAR | Status: DC
  Administered 2016-04-14: 800 [IU]/h via INTRAVENOUS
  Filled 2016-04-14: qty 250

## 2016-04-14 MED ORDER — POTASSIUM CHLORIDE 10 MEQ/100ML IV SOLN
10.0000 meq | INTRAVENOUS | Status: AC
  Administered 2016-04-14 (×2): 10 meq via INTRAVENOUS
  Filled 2016-04-14 (×4): qty 100

## 2016-04-14 MED ORDER — FENTANYL CITRATE (PF) 100 MCG/2ML IJ SOLN
25.0000 ug | INTRAMUSCULAR | Status: DC | PRN
  Administered 2016-04-14 – 2016-04-15 (×5): 25 ug via INTRAVENOUS
  Filled 2016-04-14 (×5): qty 2

## 2016-04-14 NOTE — Progress Notes (Signed)
SLP Cancellation Note  Patient Details Name: Amy [name] MRN: [id number] DOB: July 12, 1941   Cancelled treatment:       Reason Eval/Treat Not Completed: Medical issues which prohibited therapy (pt remains NPO). Will f/u as able.   [name] 04/14/2016, 3:02 PM  [name], M.A. CCC-SLP [phone]

## 2016-04-14 NOTE — Care Management Note (Signed)
Case Management Note  Patient Details  Name: Retta Pitcher MRN: [id number] Date of Birth: 1941/01/07  Subjective/Objective:   Patient is from home with spouse, patient  Had colostomy takedown on 7/18 at [facility], postoperatively patient was lethargic and required narcan,did not improve, required intubation for hypercarbia and she was transferred to  [facility] on 7/23. Per pt/ot eval rec CIR.  CSW also following patient.  NCM will cont to follow for dc needs.                 Action/Plan:   Expected Discharge Date:                  Expected Discharge Plan:  IP Rehab Facility  In-House Referral:  Clinical Social Work  Discharge planning Services  CM Consult  Post Acute Care Choice:    Choice offered to:     DME Arranged:    DME Agency:     HH Arranged:    [place] Agency:     Status of Service:  In process, will continue to follow  If discussed at Long Length of Stay Meetings, dates discussed:    Additional Comments:  Zenon Mayo, RN 04/14/2016, 6:16 PM

## 2016-04-14 NOTE — Progress Notes (Signed)
PROGRESS NOTE    Amy [name]  JYN:[id number] DOB: 1941/01/31 DOA: 04/05/2016 PCP: No primary care provider on file.   Brief Narrative:  75 year old WF PMHx Bilateral carotid artery stenosis; COPD, HTN, hypothyroidism, Non-small cell lung cancer S/P Right Lung Lobectomy , OSA  Initially admitted to [facility] where she underwent an elective loop colostomy takedown on 7/18. Her immediate post-op course was c/b lethargy and actually required narcan in PACU. She never really improved w/ hospital notes continuing to raise concern for altered mental status and difficulty to arouse. She ultimately required emergent intubation for hypercarbia (CO2 52) and inability to protect airway. She was transferred per family request on 7/23 for further evaluation and care.   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   History of CVA (cerebrovascular accident)   COPD exacerbation (HCC)   OSA (obstructive sleep apnea)   Supplemental oxygen dependent   Paroxysmal atrial fibrillation (HCC)   Dysphagia, post-stroke   Hx of colostomy   Tachycardia   Atrial fibrillation with RVR (HCC)   Acute blood loss anemia   Leukocytosis   HCAP (healthcare-associated pneumonia)   Carotid artery stenosis   Oral candidiasis   Ileus, postoperative   Pleural effusion   Abdominal pain   Pneumonia of both upper lobes due to methicillin resistant Staphylococcus aureus (MRSA) (HCC)   Ischemic bowel disease ([place])   Acute Hypercarbic respiratory failure (PCO2 52) -Resolved  COPD exacerbation/ MRSA positive HCAP/sepsis unspecified organism -Extubated 7/24 -Budesonide nebulizer  BID -DuoNeb TID -Continue current antibiotic: Will consult ID on 7/29 after  surgery, still believe increasing leukocytosis secondary to bowel ischemia vs untreated infection. -Titrate O2 to maintain SPO2 89-93% -Flutter valve -Mucinex DM BID when patient able to take PO  H/o OSA (intolerant of CPAP)_  H/o NSCLC w/ RUL lobectomy (per records in remission)   Atrial Fib with (RVR) -Aspirin 81 mg  -7/28 discontinued Eliquis--> heparin drip per pharmacy -See ischemic bowel  HTN  -Patient now NPO  -Metoprolol IV 5 mg QID -Hydralazine IV PRN  Troponin elevation (stable)  Bilateral carotid artery stenosis  -Consider statin for secondary stroke prevention once liver enzymes stabilize   7/14 S/P colostomy take-down (w/ remote h/o diverticular disease) at [facility] hospital  Post-op ileus vs SBO -KUB positive for ileus -Patient now NPO  Ischemic bowel? -Increasing leukocytosis, afebrile, positive mild abdominal tenderness. 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 7/28 Have spoken with Dr.Matthew Tsuei from [facility] who will see patient today: He has contacted vascular surgery will await final recommendations. -Discontinue Eliquis, and start patient on Heparin drip in case surgery/thrombolysis becomes necessary ADDENDUM: Patient scheduled for surgery on 7/29  Leukocytosis -Unlikely budesonide responsible for increasing leukocytosis with left shift. -7/28 Pan culture pending  Anemia -Chronic disease? -Obtain anemia panel  Acute and early subacute involving bilateral cerebral hemispheres and right cerebellar hemisphere/ Cardiothrombotic CVA -Stroke team believed secondary to new onset A. Fib -On Eliquis: On 7/28 DC in preparation for surgery  Oral candidiasis -Diflucan per pharmacy 7 days  Hypothyroidism  -7/23 TSH= 28.2 -New onset A. fib could certainly be secondary to hypo-or hyperthyroidism. -Synthroid increased to 88 g daily  Hypokalemia -Potassium IV 40 mEq  Acute  Encephalopathy: - suspect that this  is multi-factorial-->cva and sedation +/- infection.  -EEG diffuse slowing, no seizure  -Neurology following- -PT/OT; recommends CIR -SLP cognitive evaluation    DVT prophylaxis: 7/28 DC Eliquis---> Heparin drip Code Status: Full Family Communication: Spoke with husband and one daughter at length, concerning the possibility of requiring surgery.  Disposition Plan: CIR   Consultants:  Dr Garvin Fila,. Neurology stroke team Dr.Burke [place] S[place] surgery Dr.Wesam Kathryne [facility] CM Dr.Matthew Tsuei [facility] Dr Elam Dutch. Vascular surgery    Procedures/Significant Events:  CT head (at [facility]): negative  MR brain  7/23>>> Bilateral cerebral hemispheres and tight cerebellar hemisphere patchy acute and  early subacute infarcts. No mass effect or hemorrhage. Central thromboembolic source is suspected.  EEG 7/23>>> Abnormal due to moderate diffuse slowing of the waking background. No seizure or epiletiform discharges.  ECHO 7/24 >> EF 50-55%, lateral wall appears hypokinetic, systolic function normal, mild MR regurg Carotid US >>   Loop colostomy takedown 7/18 7/18-7/22: progressively lethargic  7/23 [facility] s/p getting intubated for AMS and hypercarbia. Surgical service consulted at [facility] on arrival  7/24 on [facility] gtt for af. But fully awake and following commands. Extubated. 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 -developing ischemic change is favored. -No definite pneumatosis or portal venous gas. - Altered perfusion involving the liver and spleen, likely secondary to collateralized flow from the celiac artery. -Interval development of small bilateral effusions, Lt >> Rt    Cultures Sputum 7/23: MRSA  positive MRSA screen 7/22: positive  UC 7/23>>> Negative BC 7/23>> NGTD  Antimicrobials: Meropenem 7/23>> Vancomycin 7/23>>>  Diflucan 7/26>>  Devices    LINES / TUBES:  ?? Right CVL>> OETT 7/23>>>7/24    Continuous Infusions: . dextrose 5 % and 0.9% NaCl 60 mL/hr at 04/14/16 0659  . heparin           Objective: Vitals:   04/14/16 1200 04/14/16 1535 04/14/16 1545 04/14/16 1949  BP: (!) 178/98 115/81  (!) 122/94  Pulse: 75 90  91  Resp: 18 19  (!) 22  Temp: 97.4 F (36.3 C) 97.4 F (36.3 C)  97.4 F (36.3 C)  TempSrc: Oral Oral  Axillary  SpO2: 100% 100% 100% 97%  Weight:      Height:        Intake/Output Summary (Last 24 hours) at 04/14/16 1957 Last data filed at 04/14/16 0800  Gross per 24 hour  Intake              394 ml  Output             1275 ml  Net             -881 ml   Filed Weights   04/11/16 0422 04/12/16 0400 04/13/16 0500  Weight: 63.1 kg (139 lb 1.8 oz) 65.6 kg (144 lb 10 oz) 65 kg (143 lb 4.8 oz)    Examination:  General: A/O 4, some confusion on recent events, No acute respiratory distress Eyes: negative scleral hemorrhage, negative anisocoria, negative icterus ENT: Negative Runny nose, negative gingival bleeding, Neck:  Negative scars, masses, torticollis, lymphadenopathy, JVD, right IJ CVL covering clean negative sign of infection Lungs: Clear to auscultation bilaterally without wheezes or crackles  Cardiovascular: Irregular irregular rhythm and rate, without murmur gallop or rub normal S1 and S2 Abdomen: Positive abdominal pain (described as gas), positive mild distention, positive soft, bowel sounds, no rebound, no ascites, no appreciable mass, midline exploratory laparoscopy incision with staples negative sign of infection, LLQ incision covered and clean did not take down dressing,  Extremities: No significant cyanosis, clubbing, or edema bilateral lower extremities Skin: Negative rashes, lesions, ulcers Psychiatric:  Negative  depression, negative anxiety, negative fatigue, negative mania  Central nervous system:  Cranial nerves II through XII intact, tongue/uvula midline, all extremities muscle strength 5/5, sensation intact throughout,  negative dysarthria, negative expressive aphasia, negative receptive aphasia.  .     Data Reviewed: Care during the described time interval was provided by me .  I have reviewed this patient's available data, including medical history, events of note, physical examination, and all test results as part of my evaluation. I have personally reviewed and interpreted all radiology studies.  CBC:  Recent Labs Lab 04/08/2016 1515  04/11/16 0410 04/12/16 0355 04/13/16 0024 04/13/16 0600 04/14/16 0500  WBC 22.3*  < > 35.3* 38.4* 47.1* 43.7* 67.2*  NEUTROABS 20.1*  --   --   --   --  41.9* 64.5*  HGB 10.2*  < > 9.1* 9.2* 10.0* 9.3* 9.7*  HCT 31.2*  < > 27.0* 28.8* 30.4* 28.5* 29.9*  MCV 90.2  < > 87.4 89.4 89.1 89.6 90.9  PLT 86*  < > 169 230 270 278 287  < > = values in this interval not displayed. Basic Metabolic Panel:  Recent Labs Lab 04/10/2016 1515 04/10/16 0345 04/10/16 1700 04/11/16 0410 04/12/16 0355 04/13/16 0024 04/13/16 0600 04/14/16 0500  NA 139 137 137 141 142 143  --  147*  K 4.0 2.6* 3.8 3.8 3.7 3.7  --  3.2*  CL 111 108 109 112* 113* 114*  --  115*  CO2 22 20* 20* 23 23 21*  --  25  GLUCOSE 95 216* 124* 108* 99 133*  --  119*  BUN '17 16 17 18 '$ 23* 27*  --  28*  CREATININE 1.01* 1.00 0.93 0.87 0.82 0.96  --  0.79  CALCIUM 7.9* 7.7* 7.8* 7.9* 8.1* 8.3*  --  8.3*  MG 1.7 1.4*  --  2.8* 2.6*  --  2.4 2.4  PHOS 1.3* 2.0*  --  3.3 3.0  --   --   --    GFR: Estimated Creatinine Clearance: 55.1 mL/min (by C-G formula based on SCr of 0.8 mg/dL). Liver Function Tests:  Recent Labs Lab 03/18/2016 1515 04/11/16 0410 04/12/16 0355 04/13/16 0024 04/14/16 0500  AST 50* 257* 166* 123* 260*  ALT 36 186* 182* 159* 200*  ALKPHOS 96 92 93 111 102  BILITOT 1.3*  1.2 1.3* 1.5* 1.7*  PROT 4.4* 4.3* 4.3* 4.7* 4.2*  ALBUMIN 2.0* 2.0* 2.0* 2.3* 2.1*   No results for input(s): LIPASE, AMYLASE in the last 168 hours. No results for input(s): AMMONIA in the last 168 hours. Coagulation Profile:  Recent Labs Lab 03/29/2016 1515  INR 1.12   Cardiac Enzymes:  Recent Labs Lab 03/18/2016 1515 03/25/2016 2140 04/10/16 0430  CKTOTAL 47  --   --   TROPONINI 1.95* 0.63* 0.64*   BNP (last 3 results) No results for input(s): PROBNP in the last 8760 hours. HbA1C:  Recent Labs  04/12/16 0355  HGBA1C 5.5   CBG:  Recent Labs Lab 04/14/16 0057 04/14/16 0459 04/14/16 0917 04/14/16 1159 04/14/16 1525  GLUCAP 94 120* 117* 137* 81   Lipid Profile: No results for input(s): CHOL, HDL, LDLCALC, TRIG, CHOLHDL, LDLDIRECT in the last 72 hours. Thyroid Function Tests:  Recent Labs  04/14/16 0500  TSH 13.430*   Anemia Panel:  Recent Labs  04/14/16 0500  VITAMINB12 1,206*  FOLATE 12.5  FERRITIN 415*  TIBC 228*  IRON 23*  RETICCTPCT 4.8*   Urine analysis: No results found for: COLORURINE, APPEARANCEUR, LABSPEC, PHURINE, GLUCOSEU, HGBUR, BILIRUBINUR, KETONESUR, PROTEINUR, UROBILINOGEN, NITRITE, LEUKOCYTESUR Sepsis Labs: '@LABRCNTIP'$ (procalcitonin:4,lacticidven:4)  ) Recent Results (from the past 240 hour(s))  MRSA PCR Screening     Status: Abnormal   Collection Time: 04/14/2016  2:15 PM  Result Value Ref Range Status   MRSA by PCR POSITIVE (A) NEGATIVE Final    Comment:        The GeneXpert MRSA Assay (FDA approved for NASAL specimens only), is one component of a comprehensive MRSA colonization surveillance program. It is not intended to diagnose MRSA infection nor to guide or monitor treatment for MRSA infections. RESULT CALLED TO, READ BACK BY AND VERIFIED WITH: L.VERNON,RN 04/08/2016 '@1808'$  BY V.WILKINS   Culture, blood (routine x 2)     Status: None   Collection Time: 03/28/2016  3:17 PM  Result Value Ref Range Status   Specimen  Description BLOOD RIGHT HAND  Final   Special Requests IN PEDIATRIC BOTTLE .5CC  Final   Culture NO GROWTH 5 DAYS  Final   Report Status 04/14/2016 FINAL  Final  Urine culture     Status: None   Collection Time: 04/14/2016  3:50 PM  Result Value Ref Range Status   Specimen Description URINE, CATHETERIZED  Final   Special Requests NONE  Final   Culture NO GROWTH  Final   Report Status 04/10/2016 FINAL  Final  Culture, blood (routine x 2)     Status: None   Collection Time: 03/22/2016  4:11 PM  Result Value Ref Range Status   Specimen Description BLOOD LEFT FATTY CASTS  Final   Special Requests IN PEDIATRIC BOTTLE .5CC  Final   Culture NO GROWTH 5 DAYS  Final   Report Status 04/14/2016 FINAL  Final  Culture, respiratory (tracheal aspirate)     Status: None   Collection Time: 04/11/2016  5:08 PM  Result Value Ref Range Status   Specimen Description TRACHEAL ASPIRATE  Final   Special Requests NONE  Final   Gram Stain   Final    ABUNDANT WBC PRESENT, PREDOMINANTLY PMN FEW SQUAMOUS EPITHELIAL CELLS PRESENT ABUNDANT GRAM POSITIVE COCCI IN PAIRS IN CLUSTERS RARE BUDDING YEAST SEEN    Culture   Final    ABUNDANT METHICILLIN RESISTANT STAPHYLOCOCCUS AUREUS   Report Status 04/12/2016 FINAL  Final   Organism ID, Bacteria METHICILLIN RESISTANT STAPHYLOCOCCUS AUREUS  Final      Susceptibility   Methicillin resistant staphylococcus aureus - MIC*    CIPROFLOXACIN >=8 RESISTANT Resistant     ERYTHROMYCIN >=8 RESISTANT Resistant     GENTAMICIN <=0.5 SENSITIVE Sensitive     OXACILLIN >=4 RESISTANT Resistant     TETRACYCLINE 2 SENSITIVE Sensitive     VANCOMYCIN <=0.5 SENSITIVE Sensitive     TRIMETH/SULFA <=10 SENSITIVE Sensitive     CLINDAMYCIN >=8 RESISTANT Resistant     RIFAMPIN <=0.5 SENSITIVE Sensitive     Inducible Clindamycin NEGATIVE Sensitive     * ABUNDANT METHICILLIN RESISTANT STAPHYLOCOCCUS AUREUS         Radiology Studies: Ct Abdomen Pelvis W Contrast  Result Date:  04/13/2016 CLINICAL DATA:  Leukocytosis. EXAM: CT ABDOMEN AND PELVIS WITH CONTRAST TECHNIQUE: Multidetector CT imaging of the abdomen and pelvis was performed using the standard protocol following bolus administration of intravenous contrast. CONTRAST:  120m ISOVUE-300 IOPAMIDOL (ISOVUE-300) INJECTION 61% COMPARISON:  CT abdomen pelvis- 02/10/2016; 11/29/2015; 11/24/2015 FINDINGS: Lower chest: Limited visualization of the lower thorax demonstrates development of small bilateral effusions with associated bibasilar consolidative opacities, left greater than right. Normal heart size. Calcifications of the mitral valve annulus. No pericardial effusion. Hepatobiliary: Normal hepatic contour. Interval development of heterogeneous enhancement of the hepatic parenchyma. Punctate (approximately 0.6 cm) hypo attenuating lesion within the dome of the right lobe of the liver is too small to adequately characterize of favored to represent a hepatic cyst. Interval displacement of the gallbladder, now interposed about the anterior aspect of right lobe of liver and the rib cage (representative image 26, series 2, coronal image 29, series 5). No definitive gallbladder wall thickening or pericholecystic fluid however there is a small amount of fluid about the dome of the right lobe of the liver. There is heterogeneous enhancement involving the peripheral aspect of the liver. Pancreas: Normal appearance of the pancreas. Spleen: Decreased perfusion of the spleen. No perisplenic stranding. Adrenals/Urinary Tract: There is symmetric enhancement and excretion of the bilateral kidneys. No definite renal stones this postcontrast examination. Punctate bilateral renal lesions are too small to adequately characterize though favored to represent renal cysts. No urinary obstruction or perinephric stranding. Normal appearance of the bilateral adrenal glands. There is a minimal amount of air seen within nondependent portion of the urinary  bladder, presumably the the sequela of recent in and out Foley catheterization. Stomach/Bowel: Post takedown of previously noted loop colostomy within the left lower abdominal quadrant. Enteric suture lines are seen within the sigmoid colon. There is marked gas distention of the upstream small bowel with transition point demonstrated within the left lower abdomen/pelvis (representative image 56, series 2). The exact etiology of this transition point is not identified. These findings are associated with downstream distension of the small bowel and colon, worrisome for high-grade small bowel obstruction. No pneumoperitoneum, pneumatosis or portal venous gas. There is a minimal amount of free fluid within the abdomen and lower pelvis without definable / drainable fluid collection. Vascular/Lymphatic: Moderate to large amount of eccentric mixed calcified and noncalcified atherosclerotic plaque within a normal caliber abdominal aorta. Interval development of occlusion of the origin approximately 3.2 cm of the the SMA (representative axial images 27 through 30; sagittal images 67 through 69, series 6). This represents an interval change compared to recent CT scan performed 02/10/2016 at which time a moderate high-grade stenosis was suspected on that non CTA examination.). 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 The IMA is patent. Reproductive: Post hysterectomy.  No discrete adnexal lesion. Other: There is diffuse subcutaneous edema. Skin staples are noted about the midline of the abdomen as well as about the left lateral abdomen. Scattered foci of subcutaneous emphysema are noted deep to the skin staples of the anterior aspect of left lateral abdomen (representative image 48, series 2). No definable / drainable fluid collection. Musculoskeletal: No acute or aggressive osseous abnormalities. Moderate rotatory scoliotic curvature the thoracolumbar spine with  associated moderate multilevel lumbar spine DDD. IMPRESSION: 1. Interval takedown of left lower quadrant loop colostomy. 2. 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 While the small bowel distention could be attributable to developing small bowel obstruction, given occlusion of the SMA, developing ischemic change is favored. No definite pneumatosis or portal venous gas. 3. Altered perfusion involving the liver and spleen, likely secondary to collateralized flow from the celiac artery. 4.  Aortic Atherosclerosis (ICD10-170.0) 5. Interval development of small bilateral effusions, left greater than right. Critical Value/emergent results were called by telephone at the time of interpretation on 04/13/2016 at 11:27 pm to Dr. Kieth Brightly, who verbally acknowledged these results. Electronically Signed   By: Sandi Mariscal M.D.   On: 04/13/2016 23:33  Dg Chest Port 1 View  Result Date: 04/13/2016 CLINICAL DATA:  Pleural effusion . EXAM: PORTABLE CHEST 1 VIEW COMPARISON:  04/06/2016 FINDINGS: Interim removal of endotracheal tube and NG tube. Right IJ line stable position. Heart size normal. Interim near complete resolution of right lower lobe atelectasis. Persistent left lower lobe infiltrate. Right upper lobe clips and sutures noted. Right apical pleural thickening. Persistent small bilateral pleural effusions. No pneumothorax. IMPRESSION: 1. Interval removal of endotracheal tube and NG tube. Right IJ line in stable position. 2. Interval resolution of right lower lobe atelectasis. Persistent left lower lobe infiltrate. Small bilateral pleural effusions. 3. Postsurgical changes right upper lung . Electronically Signed   By: Marcello Moores  Register   On: 04/13/2016 07:48  Dg Abd Portable 1v  Result Date: 04/13/2016 CLINICAL DATA:  75 year old female  with abdominal pain, nausea vomiting. EXAM: PORTABLE ABDOMEN - 1 VIEW COMPARISON:  Abdominal radiograph dated 04/10/2016 FINDINGS: There has been interval removal of the JP drainage catheter seen over the pelvis on the prior study. Surgical suture line noted within the pelvis. Multiple midline and left lower quadrant cutaneous surgical clips noted. A or multiple dilated air-filled loops of small bowel measuring up to 3.6 cm in diameter most compatible with ileus versus small-bowel obstruction. No definite free air identified. No radiopaque calculi. There is degenerative changes of the spine and scoliosis. No acute fracture. IMPRESSION: Interval development of mildly dilated air-filled loops of small bowel in the mid abdomen and right lower quadrant compatible with ileus versus small-bowel obstruction. Clinical correlation follow-up recommended. Electronically Signed   By: Anner Crete M.D.   On: 04/13/2016 00:33       Scheduled Meds: . sodium chloride   Intravenous Once  . antiseptic oral rinse  7 mL Mouth Rinse q12n4p  . aspirin  81 mg Oral Daily  . budesonide (PULMICORT) nebulizer solution  0.5 mg Nebulization BID  . chlorhexidine  15 mL Mouth Rinse BID  . famotidine  20 mg Oral BID  . fluconazole (DIFLUCAN) IV  200 mg Intravenous Q24H  . insulin aspart  0-9 Units Subcutaneous Q4H  . ipratropium-albuterol  3 mL Nebulization TID  . levothyroxine  88 mcg Oral QAC breakfast  . meropenem (MERREM) IV  1 g Intravenous Q8H  . metoprolol  5 mg Intravenous Q6H  . sodium chloride flush  10-40 mL Intracatheter Q12H  . vancomycin  750 mg Intravenous Q24H   Continuous Infusions: . dextrose 5 % and 0.9% NaCl 60 mL/hr at 04/14/16 0659  . heparin       LOS: 5 days    Time spent: 40 minutes    Monette Omara, Geraldo Docker, MD [facility] Pager [phone]   If 7PM-7AM, please contact night-coverage [email or web address] Password [facility] 04/14/2016, 7:57 PM

## 2016-04-14 NOTE — Clinical Social Work Note (Signed)
CSW met with patient and her husband. Another support at bedside. CSW explained that PT recommendations were to be discussed. Patient and her husband very quickly said that they had already been discussed and patient was going to CIR. CSW stated that there was documentation stating that patient and her husband were concerned about distance from home in [place], [place]. Again, they said they wanted to go to CIR but allowed CSW to leave SNF list for facilities in [place] and [place]. CSW will continue to follow progress in case plans change.  Dayton Scrape, [place]

## 2016-04-14 NOTE — Progress Notes (Signed)
Subjective: Recheck patient  Appreciate Vasc Surg consult - awaiting recommendations Patient reports no abdominal pain when she is resting in bed, only when palpated  Objective: Vital signs in last 24 hours: Temp:  [97.4 F (36.3 C)-97.5 F (36.4 C)] 97.4 F (36.3 C) (07/28 1535) Pulse Rate:  [52-130] 90 (07/28 1535) Resp:  [0-19] 19 (07/28 1535) BP: (115-178)/(43-98) 115/81 (07/28 1535) SpO2:  [96 %-100 %] 100 % (07/28 1535) Last BM Date: 04/13/16  Intake/Output from previous day: 07/27 0701 - 07/28 0700 In: 1069 [I.V.:969; IV Piggyback:100] Out: 1850 [Urine:1850] Intake/Output this shift: Total I/O In: -  Out: 250 [Urine:250]  GI: moderately distended; hypoactive bowel sounds; tender mostly on right side with palpation - no rigidity; no guarding Incisions c/d/i  Lab Results:   Recent Labs  04/13/16 0600 04/14/16 0500  WBC 43.7* 67.2*  HGB 9.3* 9.7*  HCT 28.5* 29.9*  PLT 278 287   BMET  Recent Labs  04/13/16 0024 04/14/16 0500  NA 143 147*  K 3.7 3.2*  CL 114* 115*  CO2 21* 25  GLUCOSE 133* 119*  BUN 27* 28*  CREATININE 0.96 0.79  CALCIUM 8.3* 8.3*   PT/INR No results for input(s): LABPROT, INR in the last 72 hours. ABG No results for input(s): PHART, HCO3 in the last 72 hours.  Invalid input(s): PCO2, PO2  Studies/Results: Ct Abdomen Pelvis W Contrast  Result Date: 04/13/2016 CLINICAL DATA:  Leukocytosis. EXAM: CT ABDOMEN AND PELVIS WITH CONTRAST TECHNIQUE: Multidetector CT imaging of the abdomen and pelvis was performed using the standard protocol following bolus administration of intravenous contrast. CONTRAST:  144m ISOVUE-300 IOPAMIDOL (ISOVUE-300) INJECTION 61% COMPARISON:  CT abdomen pelvis- 02/10/2016; 11/29/2015; 11/24/2015 FINDINGS: Lower chest: Limited visualization of the lower thorax demonstrates development of small bilateral effusions with associated bibasilar consolidative opacities, left greater than right. Normal heart size.  Calcifications of the mitral valve annulus. No pericardial effusion. Hepatobiliary: Normal hepatic contour. Interval development of heterogeneous enhancement of the hepatic parenchyma. Punctate (approximately 0.6 cm) hypo attenuating lesion within the dome of the right lobe of the liver is too small to adequately characterize of favored to represent a hepatic cyst. Interval displacement of the gallbladder, now interposed about the anterior aspect of right lobe of liver and the rib cage (representative image 26, series 2, coronal image 29, series 5). No definitive gallbladder wall thickening or pericholecystic fluid however there is a small amount of fluid about the dome of the right lobe of the liver. There is heterogeneous enhancement involving the peripheral aspect of the liver. Pancreas: Normal appearance of the pancreas. Spleen: Decreased perfusion of the spleen. No perisplenic stranding. Adrenals/Urinary Tract: There is symmetric enhancement and excretion of the bilateral kidneys. No definite renal stones this postcontrast examination. Punctate bilateral renal lesions are too small to adequately characterize though favored to represent renal cysts. No urinary obstruction or perinephric stranding. Normal appearance of the bilateral adrenal glands. There is a minimal amount of air seen within nondependent portion of the urinary bladder, presumably the the sequela of recent in and out Foley catheterization. Stomach/Bowel: Post takedown of previously noted loop colostomy within the left lower abdominal quadrant. Enteric suture lines are seen within the sigmoid colon. There is marked gas distention of the upstream small bowel with transition point demonstrated within the left lower abdomen/pelvis (representative image 56, series 2). The exact etiology of this transition point is not identified. These findings are associated with downstream distension of the small bowel and colon, worrisome for high-grade small  bowel  obstruction. No pneumoperitoneum, pneumatosis or portal venous gas. There is a minimal amount of free fluid within the abdomen and lower pelvis without definable / drainable fluid collection. Vascular/Lymphatic: Moderate to large amount of eccentric mixed calcified and noncalcified atherosclerotic plaque within a normal caliber abdominal aorta. Interval development of occlusion of the origin approximately 3.2 cm of the the SMA (representative axial images 27 through 30; sagittal images 67 through 69, series 6). This represents an interval change compared to recent CT scan performed 02/10/2016 at which time a moderate high-grade stenosis was suspected on that non CTA examination.). 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 The IMA is patent. Reproductive: Post hysterectomy.  No discrete adnexal lesion. Other: There is diffuse subcutaneous edema. Skin staples are noted about the midline of the abdomen as well as about the left lateral abdomen. Scattered foci of subcutaneous emphysema are noted deep to the skin staples of the anterior aspect of left lateral abdomen (representative image 48, series 2). No definable / drainable fluid collection. Musculoskeletal: No acute or aggressive osseous abnormalities. Moderate rotatory scoliotic curvature the thoracolumbar spine with associated moderate multilevel lumbar spine DDD. IMPRESSION: 1. Interval takedown of left lower quadrant loop colostomy. 2. 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 While the small bowel distention could be attributable to developing small bowel obstruction, given occlusion of the SMA, developing ischemic change is favored. No definite pneumatosis or portal venous gas. 3. Altered perfusion involving the liver  and spleen, likely secondary to collateralized flow from the celiac artery. 4.  Aortic Atherosclerosis (ICD10-170.0) 5. Interval development of small bilateral effusions, left greater than right. Critical Value/emergent results were called by telephone at the time of interpretation on 04/13/2016 at 11:27 pm to Dr. Kieth Brightly, who verbally acknowledged these results. Electronically Signed   By: Sandi Mariscal M.D.   On: 04/13/2016 23:33  Dg Chest Port 1 View  Result Date: 04/13/2016 CLINICAL DATA:  Pleural effusion . EXAM: PORTABLE CHEST 1 VIEW COMPARISON:  04/12/2016 FINDINGS: Interim removal of endotracheal tube and NG tube. Right IJ line stable position. Heart size normal. Interim near complete resolution of right lower lobe atelectasis. Persistent left lower lobe infiltrate. Right upper lobe clips and sutures noted. Right apical pleural thickening. Persistent small bilateral pleural effusions. No pneumothorax. IMPRESSION: 1. Interval removal of endotracheal tube and NG tube. Right IJ line in stable position. 2. Interval resolution of right lower lobe atelectasis. Persistent left lower lobe infiltrate. Small bilateral pleural effusions. 3. Postsurgical changes right upper lung . Electronically Signed   By: Marcello Moores  Register   On: 04/13/2016 07:48  Dg Abd Portable 1v  Result Date: 04/13/2016 CLINICAL DATA:  75 year old female with abdominal pain, nausea vomiting. EXAM: PORTABLE ABDOMEN - 1 VIEW COMPARISON:  Abdominal radiograph dated 04/10/2016 FINDINGS: There has been interval removal of the JP drainage catheter seen over the pelvis on the prior study. Surgical suture line noted within the pelvis. Multiple midline and left lower quadrant cutaneous surgical clips noted. A or multiple dilated air-filled loops of small bowel measuring up to 3.6 cm in diameter most compatible with ileus versus small-bowel obstruction. No definite free air identified. No radiopaque calculi. There is degenerative changes of the spine  and scoliosis. No acute fracture. IMPRESSION: Interval development of mildly dilated air-filled  loops of small bowel in the mid abdomen and right lower quadrant compatible with ileus versus small-bowel obstruction. Clinical correlation follow-up recommended. Electronically Signed   By: Anner Crete M.D.   On: 04/13/2016 00:33   Anti-infectives: Anti-infectives    Start     Dose/Rate Route Frequency Ordered Stop   04/13/16 2130  fluconazole (DIFLUCAN) IVPB 200 mg     200 mg 100 mL/hr over 60 Minutes Intravenous Every 24 hours 04/13/16 2033     04/13/16 1700  vancomycin (VANCOCIN) IVPB 750 mg/150 ml premix     750 mg 150 mL/hr over 60 Minutes Intravenous Every 24 hours 04/12/16 1814     04/12/16 2000  fluconazole (DIFLUCAN) tablet 200 mg  Status:  Discontinued     200 mg Oral Daily 04/12/16 1758 04/13/16 2028   04/12/16 1400  meropenem (MERREM) 1 g in sodium chloride 0.9 % 100 mL IVPB     1 g 200 mL/hr over 30 Minutes Intravenous Every 8 hours 04/12/16 1150     04/10/16 0500  meropenem (MERREM) 1 g in sodium chloride 0.9 % 100 mL IVPB  Status:  Discontinued     1 g 200 mL/hr over 30 Minutes Intravenous Every 12 hours 03/31/2016 1654 04/12/16 1150   04/10/16 0500  vancomycin (VANCOCIN) 500 mg in sodium chloride 0.9 % 100 mL IVPB  Status:  Discontinued     500 mg 100 mL/hr over 60 Minutes Intravenous Every 12 hours 04/02/2016 1654 04/12/16 1814   04/08/2016 1700  meropenem (MERREM) 1 g in sodium chloride 0.9 % 100 mL IVPB     1 g 200 mL/hr over 30 Minutes Intravenous  Once 03/30/2016 1638 04/07/2016 1730   04/17/2016 1700  vancomycin (VANCOCIN) IVPB 1000 mg/200 mL premix  Status:  Discontinued     1,000 mg 200 mL/hr over 60 Minutes Intravenous  Once 04/01/2016 1638 04/12/2016 1641   03/24/2016 1700  vancomycin (VANCOCIN) 1,250 mg in sodium chloride 0.9 % 250 mL IVPB     1,250 mg 166.7 mL/hr over 90 Minutes Intravenous  Once 03/18/2016 1641 03/30/2016 1900      Assessment/Plan: s/p  Colostomy takedown,  concern for abdominal pain yesterday CT overnight shows high grade stenosis of SMA now with clot and distended loops of bowel Leukocytosis continues to rise, however, her clinical exam is not consistent with bowel ischemia. Lactate overnight 1.3 down from previous 2.5, seems unlikely to go lower if bowel ischemia present.  She is systemically anticoagulated  for recent stroke.  -continue NPO Await vascular surgery recommendations Consider ID consult to look for source of increasing leucocytosis  Imogene Burn. Georgette Dover, MD, [facility]  General/ Trauma Surgery  04/14/2016 3:46 PM

## 2016-04-14 NOTE — Consult Note (Signed)
[facility]ist [facility]  Patient name: Amy [name] MRN: [id number] DOB: [birth year] Sex: female  REASON FOR CONSULT: SMA thrombosis;  consult is from Dr. Kieth Brightly  HPI: Amy [name] is a 75 y.o. female, who presents for evaluation of abdominal pain x 1 day. 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 Her KUB was suggestive of an ileus. She is s/p elective colostomy takedown at [facility] on 04/04/16. Her post-op course was complicated altered mental status, lethargy and respiratory distress requiring emergent intubation. She was transferred to [facility] on 03/26/2016 per family's request.   The patient was found to have new onset atrial fibrillation. 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 t-PA was not administered due to recent surgery. Her CXR was concerning for pneumonia and she was started on antibiotics. Steroids were administered for possible acute COPD exacerbation. The patient was extubated on 04/10/16. The patient's mental status has steadily improved. Her carotid duplex revealed less than 40% left ICA stenosis. Her right ICA was not visualized secondary to bandages and central line in neck. Eliquis was started on 04/11/16.   Today, the patient denies any abdominal pain. She had small BM yesterday. She denies any nausea. She is hungry and thirsty. Her stepdaughter is present and is inquiring about a diet.   She has a past medical history of hypertension, COPD, history of non-small cell lung cancer s/p RUL lobectomy, OSA (intolerant of CPAP) and hypothyroidism.   Past Medical History:  Diagnosis Date  . Bilateral carotid artery stenosis   . COPD (chronic obstructive pulmonary disease) ([place])   . GERD (gastroesophageal reflux disease)   . HTN (hypertension)   . Hypothyroidism   . Non-small cell lung cancer ([place])   . OSA (obstructive sleep apnea)    intolerant of CPAP    No family history on file.  SOCIAL  HISTORY: Social History   Social History  . Marital status: Married    Spouse name: N/A  . Number of children: N/A  . Years of education: N/A   Occupational History  . Not on file.   Social History Main Topics  . Smoking status: Not on file  . Smokeless tobacco: Not on file  . Alcohol use Not on file  . Drug use: Unknown  . Sexual activity: Not on file   Other Topics Concern  . Not on file   Social History Narrative  . No narrative on file    Allergies  Allergen Reactions  . Ativan [Lorazepam] Other (See Comments)    Confusion and agitation   . Ace Inhibitors Cough  . Codeine Nausea Only  . Morphine And Related     Went crazy  . Penicillins Itching    No other information available at this time 04/08/2016  . Prednisone Other (See Comments)    Head aches   . Spiriva Handihaler [Tiotropium Bromide Monohydrate] Other (See Comments)    Urinary retention     Current Facility-Administered Medications  Medication Dose Route Frequency Provider Last Rate Last Dose  . antiseptic oral rinse (CPC / CETYLPYRIDINIUM CHLORIDE 0.05%) solution 7 mL  7 mL Mouth Rinse q12n4p Allie Bossier, MD   7 mL at 04/13/16 1600  . apixaban (ELIQUIS) tablet 5 mg  5 mg Oral BID Erick Colace, NP   5 mg at 04/13/16 2300  . aspirin chewable tablet 81 mg  81 mg Oral Daily Erick Colace, NP   81 mg at 04/13/16  1059  . budesonide (PULMICORT) nebulizer solution 0.5 mg  0.5 mg Nebulization BID Erick Colace, NP   0.5 mg at 04/13/16 1934  . chlorhexidine (PERIDEX) 0.12 % solution 15 mL  15 mL Mouth Rinse BID Allie Bossier, MD   15 mL at 04/13/16 2300  . dextrose 5 %-0.9 % sodium chloride infusion   Intravenous Continuous Gardiner Barefoot, NP 60 mL/hr at 04/14/16 0659    . famotidine (PEPCID) tablet 20 mg  20 mg Oral BID Karren Cobble, RPH   20 mg at 04/13/16 2300  . fentaNYL (SUBLIMAZE) injection 12.5 mcg  12.5 mcg Intravenous Q2H PRN Erick Colace, NP   12.5 mcg at 04/13/16 0001  .  fentaNYL (SUBLIMAZE) injection 50 mcg  50 mcg Intravenous Q15 min PRN Erick Colace, NP   50 mcg at 04/10/16 0735  . fluconazole (DIFLUCAN) IVPB 200 mg  200 mg Intravenous Q24H Allie Bossier, MD   200 mg at 04/13/16 2300  . hydrALAZINE (APRESOLINE) injection 5 mg  5 mg Intravenous Q4H PRN Allie Bossier, MD      . insulin aspart (novoLOG) injection 0-9 Units  0-9 Units Subcutaneous Q4H Gardiner Barefoot, NP      . ipratropium-albuterol (DUONEB) 0.5-2.5 (3) MG/3ML nebulizer solution 3 mL  3 mL Nebulization TID Allie Bossier, MD   3 mL at 04/14/16 0847  . levothyroxine (SYNTHROID, LEVOTHROID) tablet 88 mcg  88 mcg Oral QAC breakfast Karren Cobble, RPH   88 mcg at 04/13/16 1059  . meropenem (MERREM) 1 g in sodium chloride 0.9 % 100 mL IVPB  1 g Intravenous Q8H Crystal Trellis Moment, RPH   1 g at 04/14/16 0500  . metoprolol (LOPRESSOR) injection 5 mg  5 mg Intravenous Q6H Allie Bossier, MD   5 mg at 04/14/16 0600  . mupirocin ointment (BACTROBAN) 2 % 1 application  1 application Nasal BID Tanda Rockers, MD   1 application at 71/06/26 2300  . ondansetron (ZOFRAN) injection 4 mg  4 mg Intravenous Q6H PRN Karsten Fells Kirby-Graham, NP      . potassium chloride 10 mEq in 100 mL IVPB  10 mEq Intravenous Q1 Hr x 4 Allie Bossier, MD      . RESOURCE THICKENUP CLEAR   Oral PRN Erick Colace, NP      . sodium chloride flush (NS) 0.9 % injection 10-40 mL  10-40 mL Intracatheter Q12H Rigoberto Noel, MD   10 mL at 04/13/16 2200  . sodium chloride flush (NS) 0.9 % injection 10-40 mL  10-40 mL Intracatheter PRN Rigoberto Noel, MD      . vancomycin (VANCOCIN) IVPB 750 mg/150 ml premix  750 mg Intravenous Q24H Theone Murdoch Hammons, RPH   750 mg at 04/13/16 1754    REVIEW OF SYSTEMS:  '[X]'$  denotes positive finding, '[ ]'$  denotes negative finding Cardiac  Comments:  Chest pain or chest pressure:    Shortness of breath upon exertion:    Short of breath when lying flat:    Irregular heart rhythm: x         Vascular    Pain in calf, thigh, or hip brought on by ambulation:    Pain in feet at night that wakes you up from your sleep:     Blood clot in your veins:    Leg swelling:         Pulmonary    Oxygen at home:  Productive cough:     Wheezing:         Neurologic    Sudden weakness in arms or legs:     Sudden numbness in arms or legs:     Sudden onset of difficulty speaking or slurred speech:    Temporary loss of vision in one eye:     Problems with dizziness:         Gastrointestinal    Blood in stool:     Vomited blood:         Genitourinary    Burning when urinating:     Blood in urine:        Psychiatric    Major depression:         Hematologic    Bleeding problems:    Problems with blood clotting too easily:        Skin    Rashes or ulcers:        Constitutional    Fever or chills:      PHYSICAL EXAM: Vitals:   04/13/16 1934 04/13/16 2249 04/14/16 0315 04/14/16 0316  BP:  (!) 161/43 (!) 141/60   Pulse:  (!) 53 (!) 130 76  Resp:  '14 17 16  '$ Temp:  97.4 F (36.3 C) 97.4 F (36.3 C)   TempSrc:  Axillary Axillary   SpO2: 100% 100% 100% 100%  Weight:      Height:        GENERAL: The patient is a well-nourished female, in no acute distress. The vital signs are documented above. CARDIAC: Irregularly irregular, no carotid bruits VASCULAR: 2+ femoral pulses bilaterally. Feet well perfused.  PULMONARY: Non-labored respiratory effort. Lungs sound clear.  ABDOMEN: bowel sounds present, soft, no distension, mild tenderness to RUQ. Midline incision clean. L abdominal bandage clean.  MUSCULOSKELETAL: There are no major deformities or cyanosis. NEUROLOGIC: No focal weakness or paresthesias are detected. SKIN: There are no ulcers or rashes noted. PSYCHIATRIC: The patient has a normal affect.  DATA:  CT Abdomen/Pelvis 04/13/16 Vascular/Lymphatic: Moderate to large amount of eccentric mixed calcified and noncalcified atherosclerotic plaque within a  normal caliber abdominal aorta. Interval development of occlusion of the origin approximately 3.2 cm of the the SMA (representative axial images 27 through 30; sagittal images 67 through 69, series 6). This represents an interval change compared to recent CT scan performed 02/10/2016 at which time a moderate high-grade stenosis was suspected on that non CTA examination.). 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 The IMA is patent. Reproductive: Post hysterectomy.  No discrete adnexal lesion.  IMPRESSION: 1. Interval takedown of left lower quadrant loop colostomy. 2. 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 While the small bowel distention could be attributable to developing small bowel obstruction, given occlusion of the SMA, developing ischemic change is favored. No definite pneumatosis or portal venous gas. 3. Altered perfusion involving the liver and spleen, likely secondary to collateralized flow from the celiac artery. 4.  Aortic Atherosclerosis (ICD10-170.0) 5. Interval development of small bilateral effusions, left greater than right.  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 CT yesterday showing small bowel distension. There is mild RUQ pain. Abdomen is soft without distension. A moderate to high-grade stenosis was suspected on CT from 02/10/16 suggesting chronic  disease. The IMA and celiac are diseased but appear patent. No CTA in the records. The patient is afebrile. Leukocytosis of 67.2, up from 43.7 yesterday. This may partly be due to steroid use.  No plans for surgical intervention as her clinical exam is not consistent with ischemic bowel. Continue NPO and antibiotics. She is currently on Eliquis for  new atrial fibrillation and recent bilateral embolic strokes.   Other active problems: new onset atrial fibrillation, bilateral CVA, COPD exacerbation, HCAP, hypertension, elevated troponin.   Will need to evaluate right internal carotid artery at some point in the future as this was not studied in recent duplex.   Virgina Jock, PA-C [facility] [place] [phone]  Addendum:   History as above Pt seen at 4 pm today.  Clinical situation discussed with Dr Georgette Dover.  On my exam her abdomen is diffusely tender.  She has multiple significant co morbidities including recent stroke, severe protein calorie malnutrition, pneumonia.  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  Her SMA and celiac branches do fill distally.  She is not a candidate for thrombolysis with recent laparotomy.  I would not consider an SMA stent due to risk of distal embolization of this fresh thrombus.  I believe best option would be aorta to SMA bypass originating from the infrarenal aorta.  This will inevitably be a very high risk procedure with high morbidity and mortality in light of currently clinical situation.  I have spoke with the pharmacy and the patient has been receiving Eliquis for several days.  Will give this overnight so that most of it will have a chance to wash out.  Start IV heparin as soon as the pharmacy believes it is safe.  Will plan on continuing the heparin no need to stop prior to OR.  I have discussed plan with pt but I do not believe her current mental status comprehends this.  I spoke with her husband and several family members including 2 daughters who are nurses.  Discussed details of operation as well of risk/benefits including but not limited to worsening infection, bowel resection possible ostomy, death, ventilator dependence as well as need but possible  consequences of delay due to Eliquis.  They understand and wish to proceed with operation.  Will check ABG now to have baseline and continue to follow lactate levels.  Type and screen.  FFP, platelets, RBC  Ruta Hinds, MD [facility] [place] Office: [phone] Pager: [phone]

## 2016-04-14 NOTE — Progress Notes (Signed)
Occupational Therapy Treatment Patient Details Name: Amy [name] MRN: [id number] DOB: [birth year] Today's Date: 04/14/2016    History of present illness Pt transferred from [facility] after reversal of colostomy. Post-op with lethargy and new onset afib. 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 Pt intubated until 7/24. PMH - lung CA, COPD, HTN   OT comments  Pt making gradual progress toward OT goals this session. Able to perform basic transfers with mod assist +2. Pt fatigues quickly and requires multiple rest breaks during functional activities but is very willing to participate in therapy. Pt able to complete grooming task in sitting; initially required hand over hand assist then progressed to set up with task. D/c plan remains appropriate. Will continue to follow acutely.   Follow Up Recommendations  CIR;Supervision/Assistance - 24 hour    Equipment Recommendations  Other (comment) (TBD at next venue)    Recommendations for Other Services      Precautions / Restrictions Precautions Precautions: Fall Restrictions Weight Bearing Restrictions: No       Mobility Bed Mobility Overal bed mobility: Needs Assistance Bed Mobility: Rolling;Sidelying to Sit Rolling: Max assist Sidelying to sit: Max assist       General bed mobility comments: VCs for hand placement and use of rail to push up into sitting. Assist to bring LEs off EOB and for elevation of trunk to sit.  Transfers Overall transfer level: Needs assistance Equipment used: 2 person hand held assist Transfers: Sit to/from [facility] Sit to Stand: Mod assist;+2 physical assistance Stand pivot transfers: Mod assist;+2 physical assistance       General transfer comment: Assist to boost up from EOB and for pivot to chair.    Balance Overall balance assessment: Needs assistance Sitting-balance support: Bilateral upper extremity supported;Feet supported Sitting balance-Leahy  Scale: Poor Sitting balance - Comments: At times can sit EOB with min guard assist then fluctuates to needing max assist with strong posterior lean.   Standing balance support: Bilateral upper extremity supported Standing balance-Leahy Scale: Poor                     ADL Overall ADL's : Needs assistance/impaired     Grooming: Moderate assistance;Brushing hair;Sitting Grooming Details (indicate cue type and reason): Pt initially required hand over hand assist for brushing hair; pt noted to have weak grip and decreased fine/gross motor coordination. Pt able to brush hair x2 instances without physical assist at end of session. VCs throughout for initiation.                 Toilet Transfer: Moderate assistance;+2 for physical assistance;Stand-pivot;BSC Toilet Transfer Details (indicate cue type and reason): Simulated by stand pivot from EOB to chair.         Functional mobility during ADLs: Moderate assistance;+2 for physical assistance (for stand pivot only) General ADL Comments: Pt participating in ADL activity well today with max verbal cues to initiation and staying on task. Pt is easily distracted but able to focus on task with cues. Pt fatigues quickly with functional mobility and ADL task in sitting. Required multiple rest breaks for perform brushing hair. Pt with elevated HR during transfer; up to 148 in standing.      Vision                     Perception     Praxis      Cognition   Behavior During Therapy: Flat affect Overall Cognitive Status: Impaired/Different from baseline  Area of Impairment: Attention;Memory;Following commands;Safety/judgement;Awareness;Problem solving   Current Attention Level: Sustained Memory: Decreased short-term memory  Following Commands: Follows one step commands consistently Safety/Judgement: Decreased awareness of safety;Decreased awareness of deficits Awareness: Emergent Problem Solving: Slow processing;Decreased  initiation;Difficulty sequencing;Requires verbal cues      Extremity/Trunk Assessment               Exercises     Shoulder Instructions       General Comments      Pertinent Vitals/ Pain       Pain Assessment: Faces Faces Pain Scale: Hurts little more Pain Location: abdominal region Pain Descriptors / Indicators: Grimacing;Guarding Pain Intervention(s): Monitored during session;Repositioned  Home Living                                          Prior Functioning/Environment              Frequency Min 2X/week     Progress Toward Goals  OT Goals(current goals can now be found in the care plan section)  Progress towards OT goals: Progressing toward goals  Acute Rehab OT Goals Patient Stated Goal: none stated OT Goal Formulation: With patient/family  Plan Discharge plan remains appropriate    Co-evaluation    PT/OT/SLP Co-Evaluation/Treatment: Yes Reason for Co-Treatment: Complexity of the patient's impairments (multi-system involvement);For patient/therapist safety;Necessary to address cognition/behavior during functional activity   OT goals addressed during session: ADL's and self-care      End of Session Equipment Utilized During Treatment: Gait belt   Activity Tolerance Patient tolerated treatment well   Patient Left in chair;with call bell/phone within reach;with chair alarm set;with family/visitor present   Nurse Communication Mobility status        Time: 1010-1035 OT Time Calculation (min): 25 min  Charges: OT General Charges $OT Visit: 1 Procedure OT Treatments $Self Care/Home Management : 8-22 mins  Binnie Kand M.S., OTR/L Pager: [phone]  04/14/2016, 10:55 AM

## 2016-04-14 NOTE — Anesthesia Preprocedure Evaluation (Addendum)
Anesthesia Evaluation  Patient identified by MRN, date of birth, ID band Patient awake    Reviewed: Allergy & Precautions, H&P , NPO status , Patient's Chart, lab work & pertinent test results  History of Anesthesia Complications Negative for: history of anesthetic complications  Airway Mallampati: III  TM Distance: >3 FB Neck ROM: full    Dental  (+) Poor Dentition   Pulmonary sleep apnea , pneumonia, unresolved, COPD,    + rhonchi  + decreased breath sounds      Cardiovascular + Peripheral Vascular Disease  + dysrhythmias Atrial Fibrillation  Rhythm:Irregular Rate:Normal  Echo with 50% EF   Neuro/Psych CVA    GI/Hepatic Neg liver ROS, GERD  ,  Endo/Other  negative endocrine ROSHypothyroidism   Renal/GU negative Renal ROS     Musculoskeletal   Abdominal   Peds  Hematology negative hematology ROS (+) anemia ,   Anesthesia Other Findings Patient is acutely ill with large leukocytosis, acute encephalopathy, anemia, elevated liver enzymes with abnormally elevated coags.. 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  Reproductive/Obstetrics negative OB ROS                            Anesthesia Physical Anesthesia Plan  ASA: IV  Anesthesia Plan: General   Post-op Pain Management:    Induction: Intravenous  Airway Management Planned: Oral ETT  Additional Equipment: Arterial line and CVP  Intra-op Plan:   Post-operative Plan: Possible Post-op intubation/ventilation  Informed Consent: I have reviewed the patients History and Physical, chart, labs and discussed the procedure including the risks, benefits and alternatives for the proposed anesthesia with the patient or authorized representative who has indicated his/her understanding and acceptance.   Dental Advisory  Given  Plan Discussed with: Anesthesiologist, CRNA and Surgeon  Anesthesia Plan Comments:         Anesthesia Quick Evaluation

## 2016-04-14 NOTE — Care Management Important Message (Signed)
Important Message  Patient Details  Name: Mikayla Chiusano MRN: [id number] Date of Birth: 1941/04/10   Medicare Important Message Given:  Yes    Zenon Mayo, RN 04/14/2016, 12:37 [place] Message  Patient Details  Name: Janiqua Friscia MRN: [id number] Date of Birth: March 17, 1941   Medicare Important Message Given:  Yes    Zenon Mayo, RN 04/14/2016, 12:37 PM

## 2016-04-14 NOTE — Progress Notes (Addendum)
ANTICOAGULATION CONSULT NOTE - Initial Consult  Pharmacy Consult for Heparin Indication: atrial fibrillation, stroke and SMA occlusion  Allergies  Allergen Reactions  . Ativan [Lorazepam] Other (See Comments)    Confusion and agitation   . Ace Inhibitors Cough  . Codeine Nausea Only  . Morphine And Related     Went crazy  . Penicillins Itching    No other information available at this time 03/20/2016  . Prednisone Other (See Comments)    Head aches   . Spiriva Handihaler [Tiotropium Bromide Monohydrate] Other (See Comments)    Urinary retention     Patient Measurements: Height: '5\' 6"'$  (167.6 cm) Weight: 143 lb 4.8 oz (65 kg) IBW/kg (Calculated) : 59.3 Heparin Dosing Weight: 65 kg  Vital Signs: Temp: 97.4 F (36.3 C) (07/28 1535) Temp Source: Oral (07/28 1535) BP: 115/81 (07/28 1535) Pulse Rate: 90 (07/28 1535)  Labs:  Recent Labs  04/12/16 0355 04/13/16 0024 04/13/16 0600 04/14/16 0500 04/14/16 1300  HGB 9.2* 10.0* 9.3* 9.7*  --   HCT 28.8* 30.4* 28.5* 29.9*  --   PLT 230 270 278 287  --   APTT  --   --   --   --  37*  HEPARINUNFRC  --   --   --   --  >2.20*  CREATININE 0.82 0.96  --  0.79  --     Estimated Creatinine Clearance: 55.1 mL/min (by C-G formula based on SCr of 0.8 mg/dL).  Assessment:   Eliquis 5 mg BID begun on 7/25 for afib and CVA. Now to change to IV heparin, in case procedure is needed with occlusion of SMA.  Last Eliquis dose given at 9am today.  IV heparin to begin ~12 hrs later.  Baseline aPTT = 37 seconds. Heparin level >2.20, but skewed due to recent Eliquis doses.  Will need to use aPTTs for heparin monitoring until heparin levels correlate.  Goal of Therapy:  Heparin level 0-3-0.5 units/ml aPTT 66-85 seconds (lower end of therapeutic range of 66-102 seconds, due to recent CVA) Monitor platelets by anticoagulation protocol: Yes   Plan:   Begin heparin drip at ~9pm tonight at 800 units/hr (~12 units/kg/hr).  First aPTT and heparin  level ~6 hrs after heparin begins.  Daily heparin level and CBC while on heparin.  Target aPTT/heparin levels at lower end of therapeutic range with recent CVA.  Follow up plans, progress.  Monitor for bleeding.  Arty Baumgartner, [place] Pager: [phone] 04/14/2016,3:59 PM

## 2016-04-14 NOTE — Progress Notes (Signed)
Inpatient Rehabilitation  Visited with patient this afternoon and discussed that IP Rehab is continuing to follow along for medical readiness.  Spouse not present at time of my arrival, I attempted to call and update him as well; however, I do not have a working number.  My co-worker will follow up Monday.    Carmelia Roller., CCC/SLP Admission Coordinator  [place]  Cell [phone]

## 2016-04-14 NOTE — Progress Notes (Signed)
Physical Therapy Treatment Patient Details Name: Amy [name] MRN: [id number] DOB: 15-Feb-1941 Today's Date: 04/14/2016    History of Present Illness Pt transferred from [facility] after reversal of colostomy. Post-op with lethargy and new onset afib. 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 Pt intubated until 7/24. PMH - lung CA, COPD, HTN    PT Comments    Patient with improvements in attention this session. Able to tolerate increased activity EOB and in chair. Perform pivot to chair with assist. HR elevation in standing to 150s, improved in sitting. Trunk control activities perform. Patient remains limited by weakness and generalized muscle fatigue. Will continue to see and progress as tolerated.   Follow Up Recommendations  CIR     Equipment Recommendations  Other (comment) (to be assessed)    Recommendations for Other Services Rehab consult     Precautions / Restrictions Precautions Precautions: Fall Restrictions Weight Bearing Restrictions: No    Mobility  Bed Mobility Overal bed mobility: Needs Assistance Bed Mobility: Rolling;Sidelying to Sit Rolling: Max assist Sidelying to sit: Max assist       General bed mobility comments: VCs for hand placement and use of rail to push up into sitting. Assist to bring LEs off EOB and for elevation of trunk to sit.  Transfers Overall transfer level: Needs assistance Equipment used: 2 person hand held assist Transfers: Sit to/from [facility] Sit to Stand: Mod assist;+2 physical assistance Stand pivot transfers: Mod assist;+2 physical assistance       General transfer comment: Assist to boost up from EOB and for pivot to chair. using chuck pad.  Patient able to readjust self in chair with cues  Ambulation/Gait                 Stairs            Wheelchair Mobility    Modified Rankin (Stroke Patients Only)       Balance Overall balance assessment: Needs  assistance Sitting-balance support: Bilateral upper extremity supported;Feet supported Sitting balance-Leahy Scale: Poor Sitting balance - Comments: At times can sit EOB with min guard assist then fluctuates to needing max assist with strong posterior lean.   Standing balance support: Bilateral upper extremity supported Standing balance-Leahy Scale: Poor                      Cognition Arousal/Alertness: Awake/alert Behavior During Therapy: Flat affect Overall Cognitive Status: Impaired/Different from baseline Area of Impairment: Attention;Memory;Following commands;Safety/judgement;Awareness;Problem solving   Current Attention Level: Sustained Memory: Decreased short-term memory Following Commands: Follows one step commands consistently Safety/Judgement: Decreased awareness of safety;Decreased awareness of deficits Awareness: Emergent Problem Solving: Slow processing;Decreased initiation;Difficulty sequencing;Requires verbal cues      Exercises Other Exercises Other Exercises: seated trunk control activities, reaching forward flexion with 10-15 second hold Other Exercises: BLE Long arc quads Other Exercises: ankle ROM    General Comments        Pertinent Vitals/Pain Pain Assessment: Faces Faces Pain Scale: Hurts little more Pain Location: abdominal region Pain Descriptors / Indicators: Grimacing;Guarding Pain Intervention(s): Monitored during session;Repositioned    Home Living                      Prior Function            PT Goals (current goals can now be found in the care plan section) Acute Rehab PT Goals Patient Stated Goal: none stated PT Goal Formulation: With patient Time For Goal  Achievement: 04/25/16 Potential to Achieve Goals: Fair Progress towards PT goals: Progressing toward goals    Frequency  Min 4X/week    PT Plan Current plan remains appropriate    Co-evaluation PT/OT/SLP Co-Evaluation/Treatment: Yes Reason for  Co-Treatment: Complexity of the patient's impairments (multi-system involvement);For patient/therapist safety;Necessary to address cognition/behavior during functional activity PT goals addressed during session: Mobility/safety with mobility OT goals addressed during session: ADL's and self-care     End of Session Equipment Utilized During Treatment: Gait belt;Oxygen Activity Tolerance: Patient limited by fatigue Patient left: in chair;with call bell/phone within reach;with chair alarm set;with family/visitor present     Time: 1010-1035 PT Time Calculation (min) (ACUTE ONLY): 25 min  Charges:  $Therapeutic Activity: 8-22 mins                    G CodesDuncan Dull April 15, 2016, 2:00 PM Alben Deeds, [place] DPT  [phone]

## 2016-04-14 NOTE — Progress Notes (Signed)
Progress Note: General Surgery Service   Subjective: Patient hungry, had small bm yesterday, does not complain of abdominal pain, no nausea  Objective: Vital signs in last 24 hours: Temp:  [96 F (35.6 C)-98.6 F (37 C)] 97.4 F (36.3 C) (07/28 0315) Pulse Rate:  [38-130] 76 (07/28 0316) Resp:  [10-17] 16 (07/28 0316) BP: (141-178)/(43-70) 141/60 (07/28 0315) SpO2:  [96 %-100 %] 100 % (07/28 0316) Last BM Date: 04/13/16  Intake/Output from previous day: 07/27 0701 - 07/28 0700 In: 675 [I.V.:575; IV Piggyback:100] Out: 1850 [Urine:1850] Intake/Output this shift: No intake/output data recorded.  Cardiovascular: irreg irreg  Abd: soft, min tenderness right side, incision clean dry intact, right wound with staples in place, no erythema  Extremities: no edema  Neuro: AOx4  Lab Results: CBC   Recent Labs  04/13/16 0600 04/14/16 0500  WBC 43.7* 67.2*  HGB 9.3* 9.7*  HCT 28.5* 29.9*  PLT 278 287   BMET  Recent Labs  04/13/16 0024 04/14/16 0500  NA 143 147*  K 3.7 3.2*  CL 114* 115*  CO2 21* 25  GLUCOSE 133* 119*  BUN 27* 28*  CREATININE 0.96 0.79  CALCIUM 8.3* 8.3*   PT/INR No results for input(s): LABPROT, INR in the last 72 hours. ABG No results for input(s): PHART, HCO3 in the last 72 hours.  Invalid input(s): PCO2, PO2  Studies/Results:  Anti-infectives: Anti-infectives    Start     Dose/Rate Route Frequency Ordered Stop   04/13/16 2130  fluconazole (DIFLUCAN) IVPB 200 mg     200 mg 100 mL/hr over 60 Minutes Intravenous Every 24 hours 04/13/16 2033     04/13/16 1700  vancomycin (VANCOCIN) IVPB 750 mg/150 ml premix     750 mg 150 mL/hr over 60 Minutes Intravenous Every 24 hours 04/12/16 1814     04/12/16 2000  fluconazole (DIFLUCAN) tablet 200 mg  Status:  Discontinued     200 mg Oral Daily 04/12/16 1758 04/13/16 2028   04/12/16 1400  meropenem (MERREM) 1 g in sodium chloride 0.9 % 100 mL IVPB     1 g 200 mL/hr over 30 Minutes Intravenous  Every 8 hours 04/12/16 1150     04/10/16 0500  meropenem (MERREM) 1 g in sodium chloride 0.9 % 100 mL IVPB  Status:  Discontinued     1 g 200 mL/hr over 30 Minutes Intravenous Every 12 hours 03/28/2016 1654 04/12/16 1150   04/10/16 0500  vancomycin (VANCOCIN) 500 mg in sodium chloride 0.9 % 100 mL IVPB  Status:  Discontinued     500 mg 100 mL/hr over 60 Minutes Intravenous Every 12 hours 03/30/2016 1654 04/12/16 1814   04/08/2016 1700  meropenem (MERREM) 1 g in sodium chloride 0.9 % 100 mL IVPB     1 g 200 mL/hr over 30 Minutes Intravenous  Once 04/03/2016 1638 04/07/2016 1730   04/17/2016 1700  vancomycin (VANCOCIN) IVPB 1000 mg/200 mL premix  Status:  Discontinued     1,000 mg 200 mL/hr over 60 Minutes Intravenous  Once 03/24/2016 1638 04/11/2016 1641   03/25/2016 1700  vancomycin (VANCOCIN) 1,250 mg in sodium chloride 0.9 % 250 mL IVPB     1,250 mg 166.7 mL/hr over 90 Minutes Intravenous  Once 03/30/2016 1641 03/30/2016 1900      Medications: Scheduled Meds: . antiseptic oral rinse  7 mL Mouth Rinse q12n4p  . apixaban  5 mg Oral BID  . aspirin  81 mg Oral Daily  . budesonide (PULMICORT) nebulizer solution  0.5 mg Nebulization BID  . chlorhexidine  15 mL Mouth Rinse BID  . famotidine  20 mg Oral BID  . fluconazole (DIFLUCAN) IV  200 mg Intravenous Q24H  . insulin aspart  0-9 Units Subcutaneous Q4H  . ipratropium-albuterol  3 mL Nebulization TID  . levothyroxine  88 mcg Oral QAC breakfast  . meropenem (MERREM) IV  1 g Intravenous Q8H  . metoprolol  5 mg Intravenous Q6H  . mupirocin ointment  1 application Nasal BID  . sodium chloride flush  10-40 mL Intracatheter Q12H  . vancomycin  750 mg Intravenous Q24H   Continuous Infusions: . dextrose 5 % and 0.9% NaCl 60 mL/hr at 04/14/16 0025   PRN Meds:.fentaNYL (SUBLIMAZE) injection, fentaNYL (SUBLIMAZE) injection, hydrALAZINE, ondansetron, RESOURCE THICKENUP CLEAR, sodium chloride flush  Assessment/Plan: Patient Active Problem List   Diagnosis Date  Noted  . HCAP (healthcare-associated pneumonia)   . Carotid artery stenosis   . Oral candidiasis   . Ileus, postoperative   . Pleural effusion   . History of CVA (cerebrovascular accident)   . COPD exacerbation ([place])   . OSA (obstructive sleep apnea)   . Supplemental oxygen dependent   . Paroxysmal atrial fibrillation ([facility])   . Dysphagia, post-stroke   . Hx of colostomy   . Tachycardia   . Atrial fibrillation with RVR ([place])   . Acute blood loss anemia   . Leukocytosis   . Acute respiratory failure with hypoxia ([place])   . Cerebrovascular accident (CVA) due to bilateral embolism of carotid arteries   . Acute encephalopathy 03/31/2016   s/p  Colostomy takedown, concern for abdominal pain yesterday, CT overnight shows high grade stenosis of SMA now with clot and distended loops of bowel, leukocytosis continues to rise, however, her clinical exam is not consistent with bowel ischemia. Lactate overnight 1.3 down from previous 2.5, seems unlikely to go lower if bowel ischemia present. She is systemically anticoagulated on eliquis for recent stroke.  -consulted vascular surgery -continue NPO -continue antibiotics -continue close monitoring    LOS: 5 days   Mickeal Skinner, MD Pg# [phone] [facility]

## 2016-04-14 NOTE — Progress Notes (Signed)
Notified Dr. Sherral Hammers of critical WBC 67.2. New orders for CKMB with iso enzymes, HLD, Lactic Acid. Told I spoke with surgeon on call this morning and was told not to draw another Lactic Acid since it was normal. Instructed to order antother one. Spoke with Lab all labs other than Lactic acid was added to specimen drawn at 0500.  Surgeon whom was on call last night present at bedside and spoke to family about CT from last night. Stated vascular surgery was consulted and will see pt today.   Patient pasted 2 small Bowel movements yesterday evening. Denies pain. No N/V last night. Afebrile.  Reported off to oncoming nurse.

## 2016-04-15 ENCOUNTER — Inpatient Hospital Stay (HOSPITAL_COMMUNITY): Payer: Medicare Other | Admitting: Anesthesiology

## 2016-04-15 ENCOUNTER — Encounter (HOSPITAL_COMMUNITY): Admission: EM | Disposition: E | Payer: Self-pay | Source: Other Acute Inpatient Hospital | Attending: Internal Medicine

## 2016-04-15 ENCOUNTER — Inpatient Hospital Stay (HOSPITAL_COMMUNITY): Payer: Medicare Other

## 2016-04-15 DIAGNOSIS — J9600 Acute respiratory failure, unspecified whether with hypoxia or hypercapnia: Secondary | ICD-10-CM

## 2016-04-15 DIAGNOSIS — I6300 Cerebral infarction due to thrombosis of unspecified precerebral artery: Secondary | ICD-10-CM

## 2016-04-15 DIAGNOSIS — K913 Postprocedural intestinal obstruction: Secondary | ICD-10-CM

## 2016-04-15 HISTORY — PX: MESENTERIC ARTERY BYPASS: SHX5968

## 2016-04-15 HISTORY — PX: BOWEL RESECTION: SHX1257

## 2016-04-15 LAB — CBC
HCT: 10.5 % — ABNORMAL LOW (ref 36.0–46.0)
HCT: 22.6 % — ABNORMAL LOW (ref 36.0–46.0)
HEMATOCRIT: 25.5 % — AB (ref 36.0–46.0)
HEMOGLOBIN: 8.4 g/dL — AB (ref 12.0–15.0)
HEMOGLOBIN: 7.6 g/dL — AB (ref 12.0–15.0)
Hemoglobin: 3.3 g/dL — CL (ref 12.0–15.0)
MCH: 27.5 pg (ref 26.0–34.0)
MCH: 28.3 pg (ref 26.0–34.0)
MCH: 28.7 pg (ref 26.0–34.0)
MCHC: 31.4 g/dL (ref 30.0–36.0)
MCHC: 32.9 g/dL (ref 30.0–36.0)
MCHC: 33.6 g/dL (ref 30.0–36.0)
MCV: 87.5 fL (ref 78.0–100.0)
MCV: 85.9 fL (ref 78.0–100.0)
MCV: 85.3 fL (ref 78.0–100.0)
PLATELETS: 37 10*3/uL — AB (ref 150–400)
Platelets: 65 10*3/uL — ABNORMAL LOW (ref 150–400)
RBC: 1.2 MIL/uL — ABNORMAL LOW (ref 3.87–5.11)
RBC: 2.97 MIL/uL — ABNORMAL LOW (ref 3.87–5.11)
RBC: 2.65 MIL/uL — AB (ref 3.87–5.11)
RDW: 14.6 % (ref 11.5–15.5)
RDW: 14.7 % (ref 11.5–15.5)
RDW: 14.4 % (ref 11.5–15.5)
WBC: 25 10*3/uL — ABNORMAL HIGH (ref 4.0–10.5)
WBC: 27.7 10*3/uL — AB (ref 4.0–10.5)

## 2016-04-15 LAB — BLOOD GAS, ARTERIAL
ACID-BASE DEFICIT: 1.1 mmol/L (ref 0.0–2.0)
BICARBONATE: 22.9 meq/L (ref 20.0–24.0)
Drawn by: 460981
FIO2: 0.21
O2 SAT: 89.2 %
PATIENT TEMPERATURE: 98.6
TCO2: 24 mmol/L (ref 0–100)
pCO2 arterial: 36.9 mmHg (ref 35.0–45.0)
pH, Arterial: 7.41 (ref 7.350–7.450)
pO2, Arterial: 59 mmHg — ABNORMAL LOW (ref 80.0–100.0)

## 2016-04-15 LAB — CK ISOENZYMES
CK BB: 0 %
CK MM: 100 % (ref 97–100)
CK-MB: 0 % (ref 0–3)
Creatine Kinase-Total: 87 U/L (ref 24–173)
Macro Type 1: 0 %
Macro Type 2: 0 %

## 2016-04-15 LAB — COMPREHENSIVE METABOLIC PANEL
ALBUMIN: 1.2 g/dL — AB (ref 3.5–5.0)
ALK PHOS: 29 U/L — AB (ref 38–126)
ALT: 163 U/L — ABNORMAL HIGH (ref 14–54)
ALT: 54 U/L (ref 14–54)
ANION GAP: 3 — AB (ref 5–15)
AST: 187 U/L — AB (ref 15–41)
AST: 103 U/L — AB (ref 15–41)
Albumin: 1.9 g/dL — ABNORMAL LOW (ref 3.5–5.0)
Alkaline Phosphatase: 95 U/L (ref 38–126)
Anion gap: 9 (ref 5–15)
BILIRUBIN TOTAL: 1.6 mg/dL — AB (ref 0.3–1.2)
BUN: 22 mg/dL — AB (ref 6–20)
BUN: 20 mg/dL (ref 6–20)
CHLORIDE: 119 mmol/L — AB (ref 101–111)
CO2: 25 mmol/L (ref 22–32)
CO2: 16 mmol/L — AB (ref 22–32)
CREATININE: 0.82 mg/dL (ref 0.44–1.00)
Calcium: 7.9 mg/dL — ABNORMAL LOW (ref 8.9–10.3)
Calcium: 9.3 mg/dL (ref 8.9–10.3)
Chloride: 117 mmol/L — ABNORMAL HIGH (ref 101–111)
Creatinine, Ser: 0.68 mg/dL (ref 0.44–1.00)
GFR calc Af Amer: >60 mL/min (ref >60)
GFR calc non Af Amer: >60 mL/min (ref >60)
GLUCOSE: 142 mg/dL — AB (ref 65–99)
Glucose, Bld: 114 mg/dL — ABNORMAL HIGH (ref 65–99)
POTASSIUM: 3.1 mmol/L — AB (ref 3.5–5.1)
Potassium: 4.4 mmol/L (ref 3.5–5.1)
SODIUM: 144 mmol/L (ref 135–145)
Sodium: 145 mmol/L (ref 135–145)
TOTAL PROTEIN: 4.1 g/dL — AB (ref 6.5–8.1)
Total Bilirubin: 1.3 mg/dL — ABNORMAL HIGH (ref 0.3–1.2)
Total Protein: <3 g/dL — ABNORMAL LOW (ref 6.5–8.1)

## 2016-04-15 LAB — POCT I-STAT 3, ART BLOOD GAS (G3+)
Acid-base deficit: 3 mmol/L — ABNORMAL HIGH (ref 0.0–2.0)
BICARBONATE: 19.3 meq/L — AB (ref 20.0–24.0)
O2 Saturation: 99 %
PCO2 ART: 23.8 mmHg — AB (ref 35.0–45.0)
TCO2: 20 mmol/L (ref 0–100)
pH, Arterial: 7.516 — ABNORMAL HIGH (ref 7.350–7.450)
pO2, Arterial: 125 mmHg — ABNORMAL HIGH (ref 80.0–100.0)

## 2016-04-15 LAB — CBC WITH DIFFERENTIAL/PLATELET
BASOS ABS: 0 10*3/uL (ref 0.0–0.1)
Basophils Relative: 0 %
EOS ABS: 0 10*3/uL (ref 0.0–0.7)
Eosinophils Relative: 0 %
HCT: 27.3 % — ABNORMAL LOW (ref 36.0–46.0)
Hemoglobin: 9.1 g/dL — ABNORMAL LOW (ref 12.0–15.0)
LYMPHS PCT: 4 %
Lymphs Abs: 3 10*3/uL (ref 0.7–4.0)
MCH: 30.5 pg (ref 26.0–34.0)
MCHC: 33.3 g/dL (ref 30.0–36.0)
MCV: 91.6 fL (ref 78.0–100.0)
MONO ABS: 2.3 10*3/uL — AB (ref 0.1–1.0)
Monocytes Relative: 3 %
NEUTROS ABS: 69.9 10*3/uL — AB (ref 1.7–7.7)
Neutrophils Relative %: 93 %
PLATELETS: 286 10*3/uL (ref 150–400)
RBC: 2.98 MIL/uL — ABNORMAL LOW (ref 3.87–5.11)
RDW: 15.8 % — AB (ref 11.5–15.5)
WBC: 75.2 10*3/uL (ref 4.0–10.5)

## 2016-04-15 LAB — MAGNESIUM
MAGNESIUM: 2.1 mg/dL (ref 1.7–2.4)
Magnesium: 1.7 mg/dL (ref 1.7–2.4)

## 2016-04-15 LAB — LACTIC ACID, PLASMA
Lactic Acid, Venous: 1.1 mmol/L (ref 0.5–1.9)
Lactic Acid, Venous: 7.6 mmol/L (ref 0.5–1.9)
Lactic Acid, Venous: 4.3 mmol/L (ref 0.5–1.9)

## 2016-04-15 LAB — GLUCOSE, CAPILLARY
GLUCOSE-CAPILLARY: 123 mg/dL — AB (ref 65–99)
Glucose-Capillary: 118 mg/dL — ABNORMAL HIGH (ref 65–99)
Glucose-Capillary: 177 mg/dL — ABNORMAL HIGH (ref 65–99)
Glucose-Capillary: 88 mg/dL (ref 65–99)

## 2016-04-15 LAB — POCT I-STAT 7, (LYTES, BLD GAS, ICA,H+H)
Acid-base deficit: 4 mmol/L — ABNORMAL HIGH (ref 0.0–2.0)
Acid-base deficit: 5 mmol/L — ABNORMAL HIGH (ref 0.0–2.0)
Bicarbonate: 21.2 mEq/L (ref 20.0–24.0)
Bicarbonate: 20.3 mEq/L (ref 20.0–24.0)
CALCIUM ION: 1.13 mmol/L (ref 1.12–1.23)
Calcium, Ion: 1.22 mmol/L (ref 1.12–1.23)
HCT: 19 % — ABNORMAL LOW (ref 36.0–46.0)
HEMATOCRIT: 18 % — AB (ref 36.0–46.0)
HEMOGLOBIN: 6.1 g/dL — AB (ref 12.0–15.0)
Hemoglobin: 6.5 g/dL — CL (ref 12.0–15.0)
O2 SAT: 99 %
O2 SAT: 99 %
PCO2 ART: 33.5 mmHg — AB (ref 35.0–45.0)
PH ART: 7.38 (ref 7.350–7.450)
PO2 ART: 154 mmHg — AB (ref 80.0–100.0)
POTASSIUM: 3.9 mmol/L (ref 3.5–5.1)
Patient temperature: 34.3
Patient temperature: 34.7
Potassium: 4.3 mmol/L (ref 3.5–5.1)
SODIUM: 147 mmol/L — AB (ref 135–145)
Sodium: 147 mmol/L — ABNORMAL HIGH (ref 135–145)
TCO2: 22 mmol/L (ref 0–100)
TCO2: 21 mmol/L (ref 0–100)
pCO2 arterial: 32.2 mmHg — ABNORMAL LOW (ref 35.0–45.0)
pH, Arterial: 7.415 (ref 7.350–7.450)
pO2, Arterial: 144 mmHg — ABNORMAL HIGH (ref 80.0–100.0)

## 2016-04-15 LAB — APTT
APTT: 193 s — AB (ref 24–36)
APTT: 83 s — AB (ref 24–36)
aPTT: 37 seconds — ABNORMAL HIGH (ref 24–36)

## 2016-04-15 LAB — AMYLASE: Amylase: 93 U/L (ref 28–100)

## 2016-04-15 LAB — PROTIME-INR
INR: 3.1
INR: 5.4
INR: 2.17
Prothrombin Time: 32.7 seconds — ABNORMAL HIGH (ref 11.4–15.2)
Prothrombin Time: 49.8 seconds — ABNORMAL HIGH (ref 11.4–15.2)
Prothrombin Time: 24.5 seconds — ABNORMAL HIGH (ref 11.4–15.2)

## 2016-04-15 LAB — PREPARE RBC (CROSSMATCH)

## 2016-04-15 LAB — LACTATE DEHYDROGENASE: LDH: 396 U/L — AB (ref 98–192)

## 2016-04-15 LAB — FIBRINOGEN: FIBRINOGEN: 169 mg/dL — AB (ref 210–475)

## 2016-04-15 LAB — LIPASE, BLOOD: Lipase: 26 U/L (ref 11–51)

## 2016-04-15 LAB — HAPTOGLOBIN: Haptoglobin: <10 mg/dL — ABNORMAL LOW (ref 34–200)

## 2016-04-15 LAB — HEPARIN LEVEL (UNFRACTIONATED)

## 2016-04-15 SURGERY — CREATION, BYPASS, ARTERIAL, MESENTERIC
Anesthesia: General

## 2016-04-15 MED ORDER — CALCIUM CHLORIDE 10 % IV SOLN
INTRAVENOUS | Status: AC
  Filled 2016-04-15: qty 10

## 2016-04-15 MED ORDER — MAGNESIUM SULFATE 2 GM/50ML IV SOLN: 2.0000 g | Freq: Every day | INTRAVENOUS | Status: DC | PRN

## 2016-04-15 MED ORDER — SODIUM CHLORIDE 0.9 % IV SOLN: Freq: Once | INTRAVENOUS | Status: DC

## 2016-04-15 MED ORDER — PHENYLEPHRINE HCL 10 MG/ML IJ SOLN
INTRAMUSCULAR | Status: AC
  Filled 2016-04-15: qty 1

## 2016-04-15 MED ORDER — MIDAZOLAM HCL 2 MG/2ML IJ SOLN: 1.0000 mg | INTRAMUSCULAR | Status: DC | PRN

## 2016-04-15 MED ORDER — HEPARIN (PORCINE) IN NACL 100-0.45 UNIT/ML-% IJ SOLN: 550.0000 [IU]/h | INTRAMUSCULAR | Status: DC

## 2016-04-15 MED ORDER — PHENOL 1.4 % MT LIQD: 1.0000 | OROMUCOSAL | Status: DC | PRN

## 2016-04-15 MED ORDER — DEXTROSE 5 % IV SOLN
INTRAVENOUS | Status: DC | PRN
  Administered 2016-04-15: 100 ug/min via INTRAVENOUS
  Administered 2016-04-15: 12:00:00 via INTRAVENOUS

## 2016-04-15 MED ORDER — PROPOFOL 10 MG/ML IV BOLUS
INTRAVENOUS | Status: AC
  Filled 2016-04-15: qty 20

## 2016-04-15 MED ORDER — ALBUMIN HUMAN 5 % IV SOLN
INTRAVENOUS | Status: DC | PRN
  Administered 2016-04-15 (×2): via INTRAVENOUS

## 2016-04-15 MED ORDER — PHENYLEPHRINE HCL 10 MG/ML IJ SOLN
0.0000 ug/min | INTRAVENOUS | Status: DC
  Administered 2016-04-15: 150 ug/min via INTRAVENOUS
  Administered 2016-04-15: 100 ug/min via INTRAVENOUS
  Administered 2016-04-17: 20 ug/min via INTRAVENOUS
  Administered 2016-04-18: 10 ug/min via INTRAVENOUS
  Administered 2016-04-20: 25 ug/min via INTRAVENOUS
  Administered 2016-04-21: 30 ug/min via INTRAVENOUS
  Administered 2016-04-22: 50 ug/min via INTRAVENOUS
  Filled 2016-04-15 (×8): qty 4

## 2016-04-15 MED ORDER — 0.9 % SODIUM CHLORIDE (POUR BTL) OPTIME
TOPICAL | Status: DC | PRN
  Administered 2016-04-15: 2000 mL

## 2016-04-15 MED ORDER — CALCIUM CHLORIDE 10 % IV SOLN
INTRAVENOUS | Status: DC | PRN
  Administered 2016-04-15: 1000 mg via INTRAVENOUS
  Administered 2016-04-15: 250 mg via INTRAVENOUS

## 2016-04-15 MED ORDER — VASOPRESSIN 20 UNIT/ML IV SOLN
0.0300 [IU]/min | INTRAVENOUS | Status: DC
  Administered 2016-04-15: 0.03 [IU]/min via INTRAVENOUS
  Administered 2016-04-16: 0.01 [IU]/min via INTRAVENOUS
  Filled 2016-04-15 (×2): qty 2

## 2016-04-15 MED ORDER — ACETAMINOPHEN 325 MG PO TABS: 325.0000 mg | ORAL_TABLET | ORAL | Status: DC | PRN

## 2016-04-15 MED ORDER — LACTATED RINGERS IV SOLN
INTRAVENOUS | Status: DC | PRN
  Administered 2016-04-15 (×2): via INTRAVENOUS

## 2016-04-15 MED ORDER — FENTANYL CITRATE (PF) 100 MCG/2ML IJ SOLN
INTRAMUSCULAR | Status: DC | PRN
  Administered 2016-04-15: 50 ug via INTRAVENOUS
  Administered 2016-04-15: 100 ug via INTRAVENOUS
  Administered 2016-04-15 (×2): 50 ug via INTRAVENOUS

## 2016-04-15 MED ORDER — SODIUM CHLORIDE 0.9 % IV SOLN
Freq: Once | INTRAVENOUS | Status: AC
  Administered 2016-04-15: 15:00:00 via INTRAVENOUS

## 2016-04-15 MED ORDER — SODIUM CHLORIDE 0.9 % IV SOLN
25.0000 ug/h | INTRAVENOUS | Status: DC
  Administered 2016-04-15: 75 ug/h via INTRAVENOUS
  Administered 2016-04-16: 125 ug/h via INTRAVENOUS
  Administered 2016-04-17: 150 ug/h via INTRAVENOUS
  Administered 2016-04-18: 30 ug/h via INTRAVENOUS
  Administered 2016-04-18: 300 ug/h via INTRAVENOUS
  Administered 2016-04-18: 325 ug/h via INTRAVENOUS
  Administered 2016-04-19 – 2016-04-20 (×3): 350 ug/h via INTRAVENOUS
  Administered 2016-04-20: 50 ug/h via INTRAVENOUS
  Administered 2016-04-20 – 2016-04-21 (×4): 350 ug/h via INTRAVENOUS
  Administered 2016-04-21: 50 ug/h via INTRAVENOUS
  Administered 2016-04-21: 350 ug/h via INTRAVENOUS
  Filled 2016-04-15 (×17): qty 50

## 2016-04-15 MED ORDER — FENTANYL CITRATE (PF) 250 MCG/5ML IJ SOLN
INTRAMUSCULAR | Status: AC
  Filled 2016-04-15: qty 5

## 2016-04-15 MED ORDER — ROCURONIUM BROMIDE 100 MG/10ML IV SOLN
INTRAVENOUS | Status: DC | PRN
  Administered 2016-04-15: 100 mg via INTRAVENOUS
  Administered 2016-04-15: 50 mg via INTRAVENOUS

## 2016-04-15 MED ORDER — POTASSIUM CHLORIDE CRYS ER 20 MEQ PO TBCR: 20.0000 meq | EXTENDED_RELEASE_TABLET | Freq: Every day | ORAL | Status: DC | PRN

## 2016-04-15 MED ORDER — HYDRALAZINE HCL 20 MG/ML IJ SOLN: 5.0000 mg | INTRAMUSCULAR | Status: DC | PRN

## 2016-04-15 MED ORDER — THROMBIN 20000 UNITS EX SOLR
CUTANEOUS | Status: AC
  Filled 2016-04-15: qty 20000

## 2016-04-15 MED ORDER — LACTATED RINGERS IV SOLN
INTRAVENOUS | Status: DC | PRN
  Administered 2016-04-15 (×3): via INTRAVENOUS

## 2016-04-15 MED ORDER — VASOPRESSIN 20 UNIT/ML IV SOLN
INTRAVENOUS | Status: AC
  Filled 2016-04-15: qty 1

## 2016-04-15 MED ORDER — ONDANSETRON HCL 4 MG/2ML IJ SOLN
4.0000 mg | Freq: Four times a day (QID) | INTRAMUSCULAR | Status: DC | PRN
Start: 2016-04-15 — End: 2016-04-30

## 2016-04-15 MED ORDER — SODIUM CHLORIDE 0.9 % IV SOLN
Freq: Once | INTRAVENOUS | Status: AC
  Administered 2016-04-15: 20:00:00 via INTRAVENOUS

## 2016-04-15 MED ORDER — MIDAZOLAM HCL 2 MG/2ML IJ SOLN
INTRAMUSCULAR | Status: AC
  Filled 2016-04-15: qty 2

## 2016-04-15 MED ORDER — VASOPRESSIN 20 UNIT/ML IV SOLN
INTRAVENOUS | Status: DC | PRN
  Administered 2016-04-15 (×2): 2 [IU] via INTRAVENOUS

## 2016-04-15 MED ORDER — ACETAMINOPHEN 325 MG RE SUPP: 325.0000 mg | RECTAL | Status: DC | PRN

## 2016-04-15 MED ORDER — SODIUM CHLORIDE 0.9 % IV SOLN
INTRAVENOUS | Status: DC | PRN
  Administered 2016-04-15: 500 mL

## 2016-04-15 MED ORDER — SODIUM CHLORIDE 0.9 % IV SOLN
500.0000 mL | Freq: Once | INTRAVENOUS | Status: DC | PRN
Start: 2016-04-15 — End: 2016-04-30

## 2016-04-15 MED ORDER — PROPOFOL 10 MG/ML IV BOLUS
INTRAVENOUS | Status: DC | PRN
  Administered 2016-04-15: 100 mg via INTRAVENOUS

## 2016-04-15 MED ORDER — CHLORHEXIDINE GLUCONATE 0.12 % MT SOLN
15.0000 mL | Freq: Two times a day (BID) | OROMUCOSAL | Status: DC
  Administered 2016-04-16 – 2016-04-25 (×19): 15 mL via OROMUCOSAL
  Filled 2016-04-15 (×5): qty 15

## 2016-04-15 MED ORDER — GUAIFENESIN-DM 100-10 MG/5ML PO SYRP: 15.0000 mL | ORAL_SOLUTION | ORAL | Status: DC | PRN

## 2016-04-15 MED ORDER — ROCURONIUM BROMIDE 50 MG/5ML IV SOLN
INTRAVENOUS | Status: AC
  Filled 2016-04-15: qty 3

## 2016-04-15 MED ORDER — LABETALOL HCL 5 MG/ML IV SOLN: 10.0000 mg | INTRAVENOUS | Status: DC | PRN

## 2016-04-15 MED ORDER — POTASSIUM CHLORIDE CRYS ER 20 MEQ PO TBCR: 40.0000 meq | EXTENDED_RELEASE_TABLET | Freq: Once | ORAL | Status: DC

## 2016-04-15 MED ORDER — FENTANYL CITRATE (PF) 100 MCG/2ML IJ SOLN: 50.0000 ug | Freq: Once | INTRAMUSCULAR | Status: DC

## 2016-04-15 MED ORDER — SODIUM CHLORIDE 0.9 % IR SOLN
Status: DC | PRN
  Administered 2016-04-15: 3000 mL

## 2016-04-15 MED ORDER — INSULIN ASPART 100 UNIT/ML ~~LOC~~ SOLN
0.0000 [IU] | SUBCUTANEOUS | Status: DC
  Administered 2016-04-16 – 2016-04-19 (×8): 1 [IU] via SUBCUTANEOUS
  Administered 2016-04-21: 2 [IU] via SUBCUTANEOUS
  Administered 2016-04-21: 1 [IU] via SUBCUTANEOUS
  Administered 2016-04-21 (×2): 2 [IU] via SUBCUTANEOUS
  Administered 2016-04-22: 1 [IU] via SUBCUTANEOUS
  Administered 2016-04-22: 2 [IU] via SUBCUTANEOUS
  Administered 2016-04-23 – 2016-04-25 (×3): 1 [IU] via SUBCUTANEOUS

## 2016-04-15 MED ORDER — POTASSIUM CHLORIDE 10 MEQ/100ML IV SOLN
10.0000 meq | INTRAVENOUS | Status: AC
  Administered 2016-04-15 (×2): 10 meq via INTRAVENOUS
  Filled 2016-04-15 (×2): qty 100

## 2016-04-15 MED ORDER — SODIUM BICARBONATE 8.4 % IV SOLN
INTRAVENOUS | Status: DC | PRN
  Administered 2016-04-15: .5 meq via INTRAVENOUS

## 2016-04-15 MED ORDER — SODIUM CHLORIDE 0.9 % IV SOLN
Freq: Once | INTRAVENOUS | Status: AC
  Administered 2016-04-15: 23:00:00 via INTRAVENOUS

## 2016-04-15 MED ORDER — HEPARIN SODIUM (PORCINE) 1000 UNIT/ML IJ SOLN
INTRAMUSCULAR | Status: DC | PRN
  Administered 2016-04-15: 7000 [IU] via INTRAVENOUS

## 2016-04-15 MED ORDER — FAMOTIDINE IN NACL 20-0.9 MG/50ML-% IV SOLN
20.0000 mg | Freq: Two times a day (BID) | INTRAVENOUS | Status: AC
  Administered 2016-04-15 – 2016-04-17 (×4): 20 mg via INTRAVENOUS
  Filled 2016-04-15 (×4): qty 50

## 2016-04-15 MED ORDER — GLYCOPYRROLATE 0.2 MG/ML IJ SOLN
INTRAMUSCULAR | Status: DC | PRN
  Administered 2016-04-15: .2 mg via INTRAVENOUS

## 2016-04-15 MED ORDER — METOPROLOL TARTRATE 5 MG/5ML IV SOLN: 2.0000 mg | INTRAVENOUS | Status: DC | PRN

## 2016-04-15 MED ORDER — FENTANYL BOLUS VIA INFUSION
25.0000 ug | INTRAVENOUS | Status: DC | PRN
  Administered 2016-04-16: 150 ug via INTRAVENOUS
  Filled 2016-04-15 (×2): qty 25

## 2016-04-15 MED ORDER — ANTISEPTIC ORAL RINSE SOLUTION (CORINZ)
7.0000 mL | Freq: Four times a day (QID) | OROMUCOSAL | Status: DC
  Administered 2016-04-16 – 2016-04-25 (×37): 7 mL via OROMUCOSAL

## 2016-04-15 MED ORDER — SODIUM CHLORIDE 0.9 % IV SOLN
Freq: Once | INTRAVENOUS | Status: AC
  Administered 2016-04-16: 01:00:00 via INTRAVENOUS

## 2016-04-15 SURGICAL SUPPLY — 55 items
ATTRACTOMAT 16X20 MAGNETIC DRP (DRAPES) IMPLANT
CANISTER SUCTION 2500CC (MISCELLANEOUS) ×2 IMPLANT
CANISTER WOUND CARE 500ML ATS (WOUND CARE) ×2 IMPLANT
CANNULA VESSEL 3MM 2 BLNT TIP (CANNULA) ×4 IMPLANT
CLIP TI MEDIUM 24 (CLIP) ×2 IMPLANT
CLIP TI WIDE RED SMALL 24 (CLIP) ×2 IMPLANT
DRSG COVADERM 4X10 (GAUZE/BANDAGES/DRESSINGS) ×2 IMPLANT
DRSG COVADERM 4X6 (GAUZE/BANDAGES/DRESSINGS) ×2 IMPLANT
ELECT BLADE 6.5 EXT (BLADE) ×2 IMPLANT
ELECT REM PT RETURN 9FT ADLT (ELECTROSURGICAL) ×4
ELECTRODE REM PT RTRN 9FT ADLT (ELECTROSURGICAL) ×2 IMPLANT
GLOVE BIO SURGEON STRL SZ7.5 (GLOVE) ×4 IMPLANT
GLOVE BIOGEL PI IND STRL 6.5 (GLOVE) ×4 IMPLANT
GLOVE BIOGEL PI IND STRL 7.5 (GLOVE) ×3 IMPLANT
GLOVE BIOGEL PI IND STRL 8 (GLOVE) ×1 IMPLANT
GLOVE BIOGEL PI INDICATOR 6.5 (GLOVE) ×4
GLOVE BIOGEL PI INDICATOR 7.5 (GLOVE) ×3
GLOVE BIOGEL PI INDICATOR 8 (GLOVE) ×1
GLOVE ECLIPSE 7.0 STRL STRAW (GLOVE) ×2 IMPLANT
GLOVE SURG SS PI 7.5 STRL IVOR (GLOVE) ×4 IMPLANT
GOWN STRL REUS W/ TWL LRG LVL3 (GOWN DISPOSABLE) ×4 IMPLANT
GOWN STRL REUS W/TWL LRG LVL3 (GOWN DISPOSABLE) ×4
INSERT FOGARTY 61MM (MISCELLANEOUS) ×2 IMPLANT
INSERT FOGARTY SM (MISCELLANEOUS) ×4 IMPLANT
KIT BASIN OR (CUSTOM PROCEDURE TRAY) ×2 IMPLANT
KIT REMOVER STAPLE SKIN (MISCELLANEOUS) ×2 IMPLANT
KIT ROOM TURNOVER OR (KITS) ×2 IMPLANT
LOOP VESSEL MINI RED (MISCELLANEOUS) IMPLANT
NS IRRIG 1000ML POUR BTL (IV SOLUTION) ×10 IMPLANT
PACK AORTA (CUSTOM PROCEDURE TRAY) ×2 IMPLANT
PAD ARMBOARD 7.5X6 YLW CONV (MISCELLANEOUS) ×4 IMPLANT
PUNCH AORTIC ROT 4.0MM RCL 40 (MISCELLANEOUS) ×4 IMPLANT
RELOAD PROXIMATE 75MM BLUE (ENDOMECHANICALS) ×6 IMPLANT
RETAINER VISCERA MED (MISCELLANEOUS) ×2 IMPLANT
SPONGE ABDOMINAL VAC ABTHERA (MISCELLANEOUS) ×2 IMPLANT
SPONGE LAP 18X18 X RAY DECT (DISPOSABLE) ×4 IMPLANT
SPONGE SURGIFOAM ABS GEL 100 (HEMOSTASIS) IMPLANT
STAPLER PROXIMATE 75MM BLUE (STAPLE) ×2 IMPLANT
STAPLER VISISTAT 35W (STAPLE) ×2 IMPLANT
SUT PDS AB 1 TP1 54 (SUTURE) ×4 IMPLANT
SUT PROLENE 3 0 SH DA (SUTURE) IMPLANT
SUT PROLENE 3 0 SH1 36 (SUTURE) ×4 IMPLANT
SUT PROLENE 5 0 C 1 36 (SUTURE) ×2 IMPLANT
SUT PROLENE 5 0 CC 1 (SUTURE) ×4 IMPLANT
SUT PROLENE 6 0 C 1 30 (SUTURE) ×2 IMPLANT
SUT PROLENE 6 0 CC (SUTURE) ×10 IMPLANT
SUT SILK 2 0 (SUTURE) ×1
SUT SILK 2 0SH CR/8 30 (SUTURE) IMPLANT
SUT SILK 2-0 18XBRD TIE 12 (SUTURE) ×1 IMPLANT
SUT SILK 3 0 (SUTURE) ×1
SUT SILK 3-0 18XBRD TIE 12 (SUTURE) ×1 IMPLANT
SUT VIC AB 3-0 SH 27 (SUTURE) ×2
SUT VIC AB 3-0 SH 27X BRD (SUTURE) ×2 IMPLANT
TRAY FOLEY W/METER SILVER 16FR (SET/KITS/TRAYS/PACK) ×2 IMPLANT
WATER STERILE IRR 1000ML POUR (IV SOLUTION) ×4 IMPLANT

## 2016-04-15 NOTE — Progress Notes (Signed)
Pt coagulopathic and hypotensive.  Will try to continue to correct coags and transfuse.  Currently hypotensive requiring Neo.  Critical care support appreciated.  Ruta Hinds, MD [facility] Office: [phone] Pager: [phone]

## 2016-04-15 NOTE — Progress Notes (Signed)
ANTICOAGULATION CONSULT NOTE - Follow-up Consult  Pharmacy Consult for Heparin Indication: atrial fibrillation, stroke and SMA occlusion  Allergies  Allergen Reactions  . Ativan [Lorazepam] Other (See Comments)    Confusion and agitation   . Ace Inhibitors Cough  . Codeine Nausea Only  . Morphine And Related     Went crazy  . Penicillins Itching    No other information available at this time 03/19/2016  . Prednisone Other (See Comments)    Head aches   . Spiriva Handihaler [Tiotropium Bromide Monohydrate] Other (See Comments)    Urinary retention     Patient Measurements: Height: '5\' 6"'$  (167.6 cm) Weight: 142 lb 10.2 oz (64.7 kg) IBW/kg (Calculated) : 59.3 Heparin Dosing Weight: 65 kg  Vital Signs: Temp: 97.4 F (36.3 C) (07/29 0007) Temp Source: Axillary (07/29 0007) BP: 139/52 (07/29 0432) Pulse Rate: 32 (07/29 0432)  Labs:  Recent Labs  04/13/16 0024 04/13/16 0600 04/14/16 0500 04/14/16 1300 04/17/2016 0328 03/23/2016 0330  HGB 10.0* 9.3* 9.7*  --   --  9.1*  HCT 30.4* 28.5* 29.9*  --   --  27.3*  PLT 270 278 287  --   --  286  APTT  --   --   --  37* 193*  --   LABPROT  --   --   --   --  32.7*  --   INR  --   --   --   --  3.10  --   HEPARINUNFRC  --   --   --  >2.20* >2.20*  --   CREATININE 0.96  --  0.79  --   --  0.68    Estimated Creatinine Clearance: 55.1 mL/min (by C-G formula based on SCr of 0.8 mg/dL).  Assessment: Pt on Eliquis 5 mg BID begun on 7/25 for afib and CVA. Last dose given 7/28 0900. Pt on heparin bridge with plan to OR 7/29 for SMA bypass - scheduled for 0715. Baseline heparin level elevated (>2.2) due to apixaban. Utilizing PTT to monitor heparin. PTT up to 193 (SUPRAtherapeutic) on gtt at 800 units/hr. RN confirmed that heparin running in R IJ and labs drawn from L arm. CBC stable, no bleeding noted.  Goal of Therapy:  Heparin level 0-3-0.5 units/ml aPTT 66-85 seconds (lower end of therapeutic range of 66-102 seconds, due to recent  CVA) Monitor platelets by anticoagulation protocol: Yes   Plan:  Hold heparin gtt x 1 hours Restart heparin gtt at 550 units/hr ~0715 (Dr. Oneida Alar will decide in OR if he wants to restart or not) Will f/u post-op  Sherlon Handing, PharmD, BCPS Clinical pharmacist, pager [phone] 04/07/2016,5:55 AM

## 2016-04-15 NOTE — Op Note (Signed)
NAMECHAQUANA, NICHOLS NO.:  [id number]  MEDICAL RECORD NO.:  [id number]  LOCATION:  2S06C                        FACILITY:  [facility]  PHYSICIAN:  Leighton Ruff. Redmond Pulling, MD, FACSDATE OF BIRTH:  02-Mar-1941  DATE OF PROCEDURE:  04/03/2016 DATE OF DISCHARGE:                              OPERATIVE REPORT   PREOPERATIVE DIAGNOSIS:  Ischemic bowel, SMA thrombus.  POSTOPERATIVE DIAGNOSIS:  Necrotic small bowel, SMA thrombus.  PROCEDURES: 1. Small bowel resection without anastomosis. 2. Placement of open abdominal wound VAC system. 3. Exploratory laparotomy with aorta to superior mesenteric artery     bypass with reverse saphenous vein graft by Dr. Oneida Alar along with a     segmental small bowel resection without anastomosis by Dr. Oneida Alar.  SURGEON:  Leighton Ruff. Redmond Pulling, MD, FACS.  CO-SURGEON:  [place] Fields, MD.  ANESTHESIA:  General.  EBL: 1. Blood administered 670 mL.  SPECIMEN:  Segments of small bowel.  INDICATIONS FOR PROCEDURE:  The patient is a 75 year old female, who had undergone a colostomy reversal at outside hospital on April 04, 2016. She presented to [facility] with a 1-day history of abdominal pain.  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  Her KUB showed suggestions of ileus.  Her postop course was also complicated by altered mental status and lethargy and respiratory distress.  She was found to be in new onset AFib.  MRI showed bilateral embolic strokes.  TPA was not administered due to recent surgery.  She also had radiological evidence concerning for pneumonia.  She was taken to the operating room by Vascular Surgery due to the significant SMA occlusion.  We were available on standby in case there was bowel compromise.  Dr. Oneida Alar requested intraoperative consult after he had performed the SMA bypass.  DESCRIPTION OF PROCEDURE:  Please see Dr. Oneida Alar' operative note for the majority of the procedure.  After he had completed the SMA  bypass and resected some of the distal small bowel due to ischemia, he asked General Surgery to join him in the operating room to assess the remaining small bowel viability.  The abdomen was clearly already open. There was a distal segment of small bowel that was in discontinuity. The surrounding small bowel proximal to that was ischemic, but not frankly necrotic; however, it was not entirely definitively 100% viable. In the mid small bowel, further upstream there is definitely an area of necrosis of about 3 cm.  I resected that with a GIA 75 stapler just above and below it and then tied off the mesentery with a 2-0 silk suture.  This was sent for analysis.  Given the fact that she was on vasopressors and it was unclear, the viability of some of her distal small bowel I felt that she would need a second-look procedure.  Also her colon anastomosis in the pelvis was very hyperemic.  It was difficult to tell the integrity of the anastomosis; however, there was no stool in the abdomen.  It appeared intact; however, it is unclear if whether or not at this time the colonic anastomosis will remain viable. An abdominal ABThera wound VAC system was obtained.  It was trimmed and placed  within the abdomen with sponges and plastic drapes according to manufacturer's directions and connected to suction.  There was a good seal with no leak.  She was taken to the intensive care unit in critical condition.  The plan will be to bring her back tomorrow for a second- look procedure for potential additional small bowel resection and potential diversion of her fecal stream.     Leighton Ruff. Redmond Pulling, MD, FACS     EMW/MEDQ  D:  03/20/2016  T:  03/27/2016  Job:  [id number]  cc:   Dr. Levell July

## 2016-04-15 NOTE — Progress Notes (Signed)
Patient to OR via bed.  Family notified.

## 2016-04-15 NOTE — Progress Notes (Signed)
PROGRESS NOTE    Amy [name]  CZY:[id number] DOB: 12-24-1940 DOA: 03/24/2016 PCP: No primary care provider on file.   Brief Narrative:  75 year old WF PMHx Bilateral carotid artery stenosis; COPD, HTN, hypothyroidism, Non-small cell lung cancer S/P Right Lung Lobectomy , OSA  Initially admitted to [facility] where she underwent an elective loop colostomy takedown on 7/18. Her immediate post-op course was c/b lethargy and actually required narcan in PACU. She never really improved w/ hospital notes continuing to raise concern for altered mental status and difficulty to arouse. She ultimately required emergent intubation for hypercarbia (CO2 52) and inability to protect airway. She was transferred per family request on 7/23 for further evaluation and care.   Subjective: 7/29 patient in surgery no charge    Assessment & Plan:   Active Problems:   Acute encephalopathy   Acute respiratory failure (HCC)   Cerebrovascular accident (CVA) due to bilateral embolism of carotid arteries   History of CVA (cerebrovascular accident)   COPD exacerbation (HCC)   OSA (obstructive sleep apnea)   Supplemental oxygen dependent   Paroxysmal atrial fibrillation (HCC)   Dysphagia, post-stroke   Hx of colostomy   Tachycardia   Atrial fibrillation with RVR (HCC)   Acute blood loss anemia   Leukocytosis   HCAP (healthcare-associated pneumonia)   Carotid artery stenosis   Oral candidiasis   Ileus, postoperative   Pleural effusion   Abdominal pain   Pneumonia of both upper lobes due to methicillin resistant Staphylococcus aureus (MRSA) (HCC)   Ischemic bowel disease ([place])   Cerebrovascular accident (CVA) due to thrombosis of precerebral artery ([place])   Acute Hypercarbic respiratory failure (PCO2 52) -Resolved  COPD exacerbation/ MRSA positive HCAP/sepsis unspecified organism -Extubated 7/24 -Budesonide nebulizer  BID -DuoNeb TID -Continue current antibiotic: Will consult ID on 7/29  after surgery, still believe increasing leukocytosis secondary to bowel ischemia vs untreated infection. -Titrate O2 to maintain SPO2 89-93% -Flutter valve -Mucinex DM BID when patient able to take PO  H/o OSA (intolerant of CPAP)_  H/o NSCLC w/ RUL lobectomy (per records in remission)   Atrial Fib with (RVR) -Aspirin 81 mg  -7/28 discontinued Eliquis--> heparin drip per pharmacy -See ischemic bowel  HTN  -Patient now NPO  -Metoprolol IV 5 mg QID -Hydralazine IV PRN  Troponin elevation (stable)  Bilateral carotid artery stenosis  -Consider statin for secondary stroke prevention once liver enzymes stabilize   7/14 S/P colostomy take-down (w/ remote h/o diverticular disease) at [facility] hospital  Post-op ileus vs SBO -KUB positive for ileus -Patient now NPO  Ischemic bowel? -Increasing leukocytosis, afebrile, positive mild abdominal tenderness. 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 7/28 Have spoken with Dr.Matthew Tsuei from [facility] who will see patient today: He has contacted vascular surgery will await final recommendations. -Discontinue Eliquis, and start patient on Heparin drip in case surgery/thrombolysis becomes necessary ADDENDUM: Patient scheduled for surgery on 7/29  Leukocytosis -Unlikely budesonide responsible for increasing leukocytosis with left shift. -7/28 Pan culture pending  Anemia -Chronic disease? -Obtain anemia panel  Acute and early subacute involving bilateral cerebral hemispheres and right cerebellar hemisphere/ Cardiothrombotic CVA -Stroke team believed secondary to new onset A. Fib -On Eliquis: On 7/28 DC in preparation for surgery  Oral candidiasis -Diflucan per pharmacy 7 days  Hypothyroidism  -7/23 TSH= 28.2 -New onset A. fib could certainly be secondary to hypo-or hyperthyroidism. -Synthroid increased to 88 g daily  Hypokalemia -Potassium IV 40 mEq  Acute Encephalopathy: - suspect  that this is multi-factorial-->cva and sedation +/- infection.  -EEG diffuse slowing, no seizure  -Neurology following- -PT/OT; recommends CIR -SLP cognitive evaluation    DVT prophylaxis: 7/28 DC Eliquis---> Heparin drip Code Status: Full Family Communication: Spoke with husband and one daughter at length, concerning the possibility of requiring surgery.  Disposition Plan: CIR   Consultants:  Dr Garvin Fila,. Neurology stroke team Dr.Burke [place] Silos surgery Dr.Wesam Kathryne [facility] CM Dr.Matthew Tsuei [facility] Dr Elam Dutch. Vascular surgery    Procedures/Significant Events:  CT head (at [facility]): negative  MR brain  7/23>>> Bilateral cerebral hemispheres and tight cerebellar hemisphere patchy acute and  early subacute infarcts. No mass effect or hemorrhage. Central thromboembolic source is suspected.  EEG 7/23>>> Abnormal due to moderate diffuse slowing of the waking background. No seizure or epiletiform discharges.  ECHO 7/24 >> EF 50-55%, lateral wall appears hypokinetic, systolic function normal, mild MR regurg Carotid US >>   Loop colostomy takedown 7/18 7/18-7/22: progressively lethargic  7/23 [facility] to [facility] s/p getting intubated for AMS and hypercarbia. Surgical service consulted at [facility] on arrival  7/24 on [facility] gtt for af. But fully awake and following commands. Extubated. 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 -developing ischemic change is favored. -No definite pneumatosis or portal venous gas. - Altered perfusion involving the liver and spleen, likely secondary to collateralized flow from the celiac artery. -Interval development of small bilateral effusions, Lt >> Rt    Cultures Sputum 7/23:  MRSA positive MRSA screen 7/22: positive  UC 7/23>>> Negative BC 7/23>> NGTD  Antimicrobials: Meropenem 7/23>> Vancomycin 7/23>>>  Diflucan 7/26>>  Devices    LINES / TUBES:  ?? Right CVL>> OETT 7/23>>>7/24    Continuous Infusions: . dextrose 5 % and 0.9% NaCl 60 mL/hr at 04/14/2016 0630  . fentaNYL infusion INTRAVENOUS 50 mcg/hr (03/28/2016 1815)  . phenylephrine (NEO-SYNEPHRINE) Adult infusion 50 mcg/min (04/08/2016 1709)         Objective: Vitals:   03/27/2016 1845 04/01/2016 1900 04/06/2016 1915 03/20/2016 1925  BP: (!) 63/41 (!) 78/64 94/68   Pulse:      Resp: (!) 0 (!) 26 (!) 26 (!) 8  Temp:    97.4 F (36.3 C)  TempSrc:    Axillary  SpO2:      Weight:      Height:        Intake/Output Summary (Last 24 hours) at 03/28/2016 1928 Last data filed at 03/18/2016 1900  Gross per 24 hour  Intake         10579.81 ml  Output             5085 ml  Net          5494.81 ml   Filed Weights   04/12/16 0400 04/13/16 0500 04/12/2016 0421  Weight: 65.6 kg (144 lb 10 oz) 65 kg (143 lb 4.8 oz) 64.7 kg (142 lb 10.2 oz)    Examination: Patient in surgery no charge .     Data Reviewed: Care during the described time interval was provided by me .  I have reviewed this patient's available data, including medical history, events of note, physical examination, and all test results as part of my evaluation. I have  personally reviewed and interpreted all radiology studies.  CBC:  Recent Labs Lab 04/04/2016 1515  04/13/16 0600 04/14/16 0500 03/31/2016 0330 03/25/2016 1018 03/25/2016 1210 04/02/2016 1455 03/20/2016 1650  WBC 22.3*  < > 43.7* 67.2* 75.2*  --   --  QUESTIONABLE RESULTS, RECOMMEND RECOLLECT TO VERIFY 25.0*  NEUTROABS 20.1*  --  41.9* 64.5* 69.9*  --   --   --   --   HGB 10.2*  < > 9.3* 9.7* 9.1* 6.1* 6.5* 3.3* 8.4*  HCT 31.2*  < > 28.5* 29.9* 27.3* 18.0* 19.0* 10.5* 25.5*  MCV 90.2  < > 89.6 90.9 91.6  --   --  87.5 85.9  PLT 86*  < > 278 287 286  --   --  QUESTIONABLE RESULTS,  RECOMMEND RECOLLECT TO VERIFY 65*  < > = values in this interval not displayed. Basic Metabolic Panel:  Recent Labs Lab 04/10/2016 1515 04/10/16 0345  04/11/16 0410 04/12/16 0355 04/13/16 0024 04/13/16 0600 04/14/16 0500 04/03/2016 0330 04/08/2016 1018 03/19/2016 1210 03/20/2016 1455  NA 139 137  < > 141 142 143  --  147* 145 147* 147* 144  K 4.0 2.6*  < > 3.8 3.7 3.7  --  3.2* 3.1* 3.9 4.3 4.4  CL 111 108  < > 112* 113* 114*  --  115* 117*  --   --  119*  CO2 22 20*  < > 23 23 21*  --  25 25  --   --  16*  GLUCOSE 95 216*  < > 108* 99 133*  --  119* 114*  --   --  142*  BUN 17 16  < > 18 23* 27*  --  28* 22*  --   --  20  CREATININE 1.01* 1.00  < > 0.87 0.82 0.96  --  0.79 0.68  --   --  0.82  CALCIUM 7.9* 7.7*  < > 7.9* 8.1* 8.3*  --  8.3* 7.9*  --   --  9.3  MG 1.7 1.4*  --  2.8* 2.6*  --  2.4 2.4 2.1  --   --  1.7  PHOS 1.3* 2.0*  --  3.3 3.0  --   --   --   --   --   --   --   < > = values in this interval not displayed. GFR: Estimated Creatinine Clearance: 53.8 mL/min (by C-G formula based on SCr of 0.82 mg/dL). Liver Function Tests:  Recent Labs Lab 04/12/16 0355 04/13/16 0024 04/14/16 0500 04/06/2016 0330 04/10/2016 1455  AST 166* 123* 260* 187* 103*  ALT 182* 159* 200* 163* 54  ALKPHOS 93 111 102 95 29*  BILITOT 1.3* 1.5* 1.7* 1.6* 1.3*  PROT 4.3* 4.7* 4.2* 4.1* <3.0*  ALBUMIN 2.0* 2.3* 2.1* 1.9* 1.2*    Recent Labs Lab 04/14/16 2003 04/10/2016 0330  LIPASE 32 26  AMYLASE  --  93   No results for input(s): AMMONIA in the last 168 hours. Coagulation Profile:  Recent Labs Lab 03/27/2016 1515 04/04/2016 0328 04/04/2016 1455  INR 1.12 3.10 5.40*   Cardiac Enzymes:  Recent Labs Lab 04/07/2016 1515 03/21/2016 2140 04/10/16 0430 04/14/16 0924  CKTOTAL 47  --   --   --   CKMB  --   --   --  0  TROPONINI 1.95* 0.63* 0.64*  --    BNP (last 3 results) No results for input(s): PROBNP in the last 8760 hours. HbA1C: No results for  input(s): HGBA1C in the last 72  hours. CBG:  Recent Labs Lab 04/14/16 1525 04/14/16 2106 04/03/2016 0107 04/14/2016 0455 03/29/2016 1714  GLUCAP 81 123* 118* 123* 177*   Lipid Profile: No results for input(s): CHOL, HDL, LDLCALC, TRIG, CHOLHDL, LDLDIRECT in the last 72 hours. Thyroid Function Tests:  Recent Labs  04/14/16 0500  TSH 13.430*   Anemia Panel:  Recent Labs  04/14/16 0500  VITAMINB12 1,206*  FOLATE 12.5  FERRITIN 415*  TIBC 228*  IRON 23*  RETICCTPCT 4.8*   Urine analysis: No results found for: COLORURINE, APPEARANCEUR, LABSPEC, PHURINE, GLUCOSEU, HGBUR, BILIRUBINUR, KETONESUR, PROTEINUR, UROBILINOGEN, NITRITE, LEUKOCYTESUR Sepsis Labs: '@LABRCNTIP'$ (procalcitonin:4,lacticidven:4)  ) Recent Results (from the past 240 hour(s))  MRSA PCR Screening     Status: Abnormal   Collection Time: 04/01/2016  2:15 PM  Result Value Ref Range Status   MRSA by PCR POSITIVE (A) NEGATIVE Final    Comment:        The GeneXpert MRSA Assay (FDA approved for NASAL specimens only), is one component of a comprehensive MRSA colonization surveillance program. It is not intended to diagnose MRSA infection nor to guide or monitor treatment for MRSA infections. RESULT CALLED TO, READ BACK BY AND VERIFIED WITH: L.VERNON,RN 04/14/2016 '@1808'$  BY V.WILKINS   Culture, blood (routine x 2)     Status: None   Collection Time: 03/23/2016  3:17 PM  Result Value Ref Range Status   Specimen Description BLOOD RIGHT HAND  Final   Special Requests IN PEDIATRIC BOTTLE .5CC  Final   Culture NO GROWTH 5 DAYS  Final   Report Status 04/14/2016 FINAL  Final  Urine culture     Status: None   Collection Time: 04/10/2016  3:50 PM  Result Value Ref Range Status   Specimen Description URINE, CATHETERIZED  Final   Special Requests NONE  Final   Culture NO GROWTH  Final   Report Status 04/10/2016 FINAL  Final  Culture, blood (routine x 2)     Status: None   Collection Time: 03/24/2016  4:11 PM  Result Value Ref Range Status   Specimen  Description BLOOD LEFT FATTY CASTS  Final   Special Requests IN PEDIATRIC BOTTLE .5CC  Final   Culture NO GROWTH 5 DAYS  Final   Report Status 04/14/2016 FINAL  Final  Culture, respiratory (tracheal aspirate)     Status: None   Collection Time: 03/25/2016  5:08 PM  Result Value Ref Range Status   Specimen Description TRACHEAL ASPIRATE  Final   Special Requests NONE  Final   Gram Stain   Final    ABUNDANT WBC PRESENT, PREDOMINANTLY PMN FEW SQUAMOUS EPITHELIAL CELLS PRESENT ABUNDANT GRAM POSITIVE COCCI IN PAIRS IN CLUSTERS RARE BUDDING YEAST SEEN    Culture   Final    ABUNDANT METHICILLIN RESISTANT STAPHYLOCOCCUS AUREUS   Report Status 04/12/2016 FINAL  Final   Organism ID, Bacteria METHICILLIN RESISTANT STAPHYLOCOCCUS AUREUS  Final      Susceptibility   Methicillin resistant staphylococcus aureus - MIC*    CIPROFLOXACIN >=8 RESISTANT Resistant     ERYTHROMYCIN >=8 RESISTANT Resistant     GENTAMICIN <=0.5 SENSITIVE Sensitive     OXACILLIN >=4 RESISTANT Resistant     TETRACYCLINE 2 SENSITIVE Sensitive     VANCOMYCIN <=0.5 SENSITIVE Sensitive     TRIMETH/SULFA <=10 SENSITIVE Sensitive     CLINDAMYCIN >=8 RESISTANT Resistant     RIFAMPIN <=0.5 SENSITIVE Sensitive     Inducible Clindamycin NEGATIVE Sensitive     *  ABUNDANT METHICILLIN RESISTANT STAPHYLOCOCCUS AUREUS  Culture, blood (routine x 2)     Status: None (Preliminary result)   Collection Time: 04/14/16  6:55 PM  Result Value Ref Range Status   Specimen Description BLOOD BLOOD RIGHT WRIST  Final   Special Requests IN PEDIATRIC BOTTLE 1.5CC  Final   Culture NO GROWTH < 24 HOURS  Final   Report Status PENDING  Incomplete  Culture, blood (routine x 2)     Status: None (Preliminary result)   Collection Time: 04/14/16  7:07 PM  Result Value Ref Range Status   Specimen Description BLOOD LEFT HAND  Final   Special Requests IN PEDIATRIC BOTTLE 4.5CC  Final   Culture NO GROWTH < 24 HOURS  Final   Report Status PENDING  Incomplete          Radiology Studies: Ct Abdomen Pelvis W Contrast  Result Date: 04/13/2016 CLINICAL DATA:  Leukocytosis. EXAM: CT ABDOMEN AND PELVIS WITH CONTRAST TECHNIQUE: Multidetector CT imaging of the abdomen and pelvis was performed using the standard protocol following bolus administration of intravenous contrast. CONTRAST:  134m ISOVUE-300 IOPAMIDOL (ISOVUE-300) INJECTION 61% COMPARISON:  CT abdomen pelvis- 02/10/2016; 11/29/2015; 11/24/2015 FINDINGS: Lower chest: Limited visualization of the lower thorax demonstrates development of small bilateral effusions with associated bibasilar consolidative opacities, left greater than right. Normal heart size. Calcifications of the mitral valve annulus. No pericardial effusion. Hepatobiliary: Normal hepatic contour. Interval development of heterogeneous enhancement of the hepatic parenchyma. Punctate (approximately 0.6 cm) hypo attenuating lesion within the dome of the right lobe of the liver is too small to adequately characterize of favored to represent a hepatic cyst. Interval displacement of the gallbladder, now interposed about the anterior aspect of right lobe of liver and the rib cage (representative image 26, series 2, coronal image 29, series 5). No definitive gallbladder wall thickening or pericholecystic fluid however there is a small amount of fluid about the dome of the right lobe of the liver. There is heterogeneous enhancement involving the peripheral aspect of the liver. Pancreas: Normal appearance of the pancreas. Spleen: Decreased perfusion of the spleen. No perisplenic stranding. Adrenals/Urinary Tract: There is symmetric enhancement and excretion of the bilateral kidneys. No definite renal stones this postcontrast examination. Punctate bilateral renal lesions are too small to adequately characterize though favored to represent renal cysts. No urinary obstruction or perinephric stranding. Normal appearance of the bilateral adrenal glands. There is  a minimal amount of air seen within nondependent portion of the urinary bladder, presumably the the sequela of recent in and out Foley catheterization. Stomach/Bowel: Post takedown of previously noted loop colostomy within the left lower abdominal quadrant. Enteric suture lines are seen within the sigmoid colon. There is marked gas distention of the upstream small bowel with transition point demonstrated within the left lower abdomen/pelvis (representative image 56, series 2). The exact etiology of this transition point is not identified. These findings are associated with downstream distension of the small bowel and colon, worrisome for high-grade small bowel obstruction. No pneumoperitoneum, pneumatosis or portal venous gas. There is a minimal amount of free fluid within the abdomen and lower pelvis without definable / drainable fluid collection. Vascular/Lymphatic: Moderate to large amount of eccentric mixed calcified and noncalcified atherosclerotic plaque within a normal caliber abdominal aorta. Interval development of occlusion of the origin approximately 3.2 cm of the the SMA (representative axial images 27 through 30; sagittal images 67 through 69, series 6). This represents an interval change compared to recent CT scan performed 02/10/2016 at  which time a moderate high-grade stenosis was suspected on that non CTA examination.). 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 The IMA is patent. Reproductive: Post hysterectomy.  No discrete adnexal lesion. Other: There is diffuse subcutaneous edema. Skin staples are noted about the midline of the abdomen as well as about the left lateral abdomen. Scattered foci of subcutaneous emphysema are noted deep to the skin staples of the anterior aspect of left lateral abdomen (representative image 48, series 2). No definable / drainable fluid collection. Musculoskeletal: No acute or aggressive osseous  abnormalities. Moderate rotatory scoliotic curvature the thoracolumbar spine with associated moderate multilevel lumbar spine DDD. IMPRESSION: 1. Interval takedown of left lower quadrant loop colostomy. 2. 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 While the small bowel distention could be attributable to developing small bowel obstruction, given occlusion of the SMA, developing ischemic change is favored. No definite pneumatosis or portal venous gas. 3. Altered perfusion involving the liver and spleen, likely secondary to collateralized flow from the celiac artery. 4.  Aortic Atherosclerosis (ICD10-170.0) 5. Interval development of small bilateral effusions, left greater than right. Critical Value/emergent results were called by telephone at the time of interpretation on 04/13/2016 at 11:27 pm to Dr. Kieth Brightly, who verbally acknowledged these results. Electronically Signed   By: Sandi Mariscal M.D.   On: 04/13/2016 23:33  Dg Chest Port 1 View  Result Date: 03/27/2016 CLINICAL DATA:  Central line placement EXAM: PORTABLE CHEST 1 VIEW COMPARISON:  04/13/2016 FINDINGS: Right central line remains in place with the tip in the SVC. Endotracheal tube is now placed with the ti no acute bony abnormality. P 3 cm above the carina. NG tube has been placed into the stomach. Heart is normal size. Layering left pleural effusion. Left lower lobe atelectasis. Stable right lower lobe pleural thickening with surgical clips in the region. No acute bony abnormality. IMPRESSION: Layering left pleural effusion with left lower lobe atelectasis. Endotracheal tube 3 cm above the carina. NG tube enters the stomach. Right central line is unchanged. Electronically Signed   By: Rolm Baptise M.D.   On: 04/07/2016 15:13       Scheduled Meds: . sodium chloride    Intravenous Once  . sodium chloride   Intravenous Once  . sodium chloride   Intravenous Once  . sodium chloride   Intravenous Once  . sodium chloride   Intravenous Once  . sodium chloride   Intravenous Once  . sodium chloride   Intravenous Once  . antiseptic oral rinse  7 mL Mouth Rinse q12n4p  . aspirin  81 mg Oral Daily  . budesonide (PULMICORT) nebulizer solution  0.5 mg Nebulization BID  . chlorhexidine  15 mL Mouth Rinse BID  . famotidine (PEPCID) IV  20 mg Intravenous Q12H  . fentaNYL (SUBLIMAZE) injection  50 mcg Intravenous Once  . fluconazole (DIFLUCAN) IV  200 mg Intravenous Q24H  . insulin aspart  0-9 Units Subcutaneous Q4H  . ipratropium-albuterol  3 mL Nebulization TID  . levothyroxine  88 mcg Oral QAC breakfast  . meropenem (MERREM) IV  1 g Intravenous Q8H  . sodium chloride flush  10-40 mL Intracatheter Q12H  . vancomycin  750 mg Intravenous Q24H   Continuous Infusions: . dextrose 5 % and 0.9% NaCl 60 mL/hr at  03/28/2016 0630  . fentaNYL infusion INTRAVENOUS 50 mcg/hr (03/31/2016 1815)  . phenylephrine (NEO-SYNEPHRINE) Adult infusion 50 mcg/min (04/02/2016 1709)     LOS: 6 days    Time spent: 40 minutes    Kaetlyn Noa, Geraldo Docker, MD [facility] Pager [phone]   If 7PM-7AM, please contact night-coverage [email or web address] Password [facility] 04/04/2016, 7:28 PM

## 2016-04-15 NOTE — Transfer of Care (Signed)
Immediate Anesthesia Transfer of Care Note  Patient: Amy [name]  Procedure(s) Performed: Procedure(s): Exploratory Laparatomy; Aorto to Superior Mesenteric Artery Bypass with Reversed Greater Saphenous Vein Graft  (N/A) SMALL BOWEL RESECTION with application of open abdominal wound vac (N/A)  Patient Location: SICU  Anesthesia Type:General  Level of Consciousness: Patient remains intubated per anesthesia plan  Airway & Oxygen Therapy: Patient remains intubated per anesthesia plan  Post-op Assessment: Report given to RN and Post -op Vital signs reviewed and stable  Post vital signs: Reviewed and stable  Last Vitals:  Vitals:   04/13/2016 0432 03/26/2016 1435  BP: (!) 139/52 (!) 76/51  Pulse: (!) 32 74  Resp: 12 (!) 26  Temp: 36.1 C     Last Pain:  Vitals:   03/29/2016 0445  TempSrc:   PainSc: Asleep      Patients Stated Pain Goal: 0 (74/25/95 [id number])  Complications: No apparent anesthesia complications

## 2016-04-15 NOTE — Progress Notes (Signed)
Received call from [facility] regarding critical lab value lactic acid 7.6and notified Dr. Tamala Julian eMD.

## 2016-04-15 NOTE — Plan of Care (Signed)
Problem: Coping: Goal: Ability to identify appropriate support needs will improve Outcome: Completed/Met Date Met: 03/18/2016 Large amount of family present at bedside.  All are supportive of patient's recovery and eager to take part in care.

## 2016-04-15 NOTE — Op Note (Signed)
Procedure: Exploratory laparotomy, lysis of adhesions, aorto to superior mesenteric artery bypass using reversed right greater saphenous vein  Preoperative diagnosis: Mesenteric ischemia  Postoperative diagnosis: Same  Anesthesia: Gen.  Assistant: Gerri Lins PA-C  Operative findings: #1 segments of gangrenous bowel #2 vein bypass graft extending from just above the aortic bifurcation to the proximal superior mesenteric artery  Operative details: After obtaining informed consent, the patient was taken the operating room. The patient placed in supine position operating table. After induction general anesthesia and endotracheal intubation a nasogastric tube and Foley catheter were placed. Next staples in the abdomen from a pre-existing laparotomy were removed. The subcutaneous tissues were removed. Pre-existing fascial sutures were also removed. There was some Soma 2 prevent adhesions overlying the abdomen contents.  This was removed. The bowel was dusky in appearance diffusely. There were inflammatory adhesions spread throughout from recent laparotomy. These were taken down with blunt and sharp dissection. Care was taken not to disrupt any recent colon anastomosis. Small bowel was reflected to the right. The omentum and transverse colon were reflected superiorly. This was all done after approximately one hour of adhesion lysis. Omni retractor was brought up in the operative field and used for cystoscopy retraction. The retroperitoneum was entered. The abdominal aorta was exposed extending from the aortic bifurcation all the way to the level of the left and right renal arteries. Both renal arteries were identified and protected. The inferior mesenteric vein was ligated and divided between silk ties to assist in exposure. The renal vein was dissected free on its inferior surface to allow further exposure of the infrarenal abdominal aorta. The aorta was heavily calcified about 2 cm below the takeoff of  the renal arteries. This was circumferential calcium. There was a soft spot anteriorly just below the takeoff of the renal arteries and just above the aortic bifurcation. The left and right common iliac arteries were dissected free circumferentially and vessel loops placed around them. The right common iliac artery was severely circumferentially calcified. The left common iliac artery was softer in character. The inferior mesenteric artery was dissected free circumferentially and a vessel loop placed around this. The infrarenal abdominal aorta was dissected free circumferentially just below the renal arteries and an umbilical tape placed around this. Attention was then turned to exposing the superior mesenteric artery. This was exposed by taking down the duodenal attachments and the superior mesenteric artery was dissected free circumferentially at the root of the mesentery. Several large side branches were dissected free circumferentially and vessel loops placed around these. There was no pulse within the artery. The artery was thickened on palpation. At this point attention was turned to the right groin. Longitudinal incision was made in this location to expose the right greater saphenous vein. Through one additional incision about 10 cm of saphenous vein was harvested side branches were ligated and divided between silk ties. Vein was harvested AT THE LEVEL SAPHENOFEMORAL JUNCTION. THIS WAS LIGATED THIS LOCATION WITH A 2-0 SILK TIE PROXIMALLY AND DISTALLY. THE VEIN WAS PLACED IN A REVERSED CONFIGURATION. NEXT THE PATIENT WAS GIVEN 7000 UNITS OF INTRAVENOUS HEPARIN. THE INFRARENAL AORTA WAS CLAMPED WITH A HARKEN CLAMP JUST BELOW THE LEVEL RENAL ARTERIES. THE AORTA WAS ALSO CLAMPED JUST ABOVE THE AORTIC BIFURCATION. ON INSPECTION OF THE AORTA I DID NOT FEEL I HAD ENOUGH ROOM FOR PROXIMAL ANASTOMOSIS BEFORE GETTING INTO HEAVY CALCIFICATION JUST BELOW THE RENAL ARTERIES. THEREFORE I SELECTED A LOCATION JUST ABOVE THE  AORTIC BIFURCATION FOR THE PROXIMAL ANASTOMOSIS. THIS IS  WITH THE UNDERSTANDING THAT THIS MAY POTENTIALLY CREATE MORE KINKING BUT WAS REALLY THE ONLY SUITABLE DONOR SITE FOR THE BYPASS GRAFT. AT THIS POINT THE INFERIOR MESENTERIC ARTERY WAS CONTROLLED WITH A FINE BULLDOG CLAMP. A LONGITUDINAL OPENING WAS MADE IN THE INFRARENAL ABDOMINAL AORTA AND THIS WAS EXTENDED WITH A 4 MM PUNCH TO MAKE A REASONABLE OPENING IN THE AORTA. THE VEIN WAS PLACED IN REVERSED CONFIGURATION AND SEWN END OF VEIN TO SIDE OF AORTA USING A RUNNING 6-0 PROLENE SUTURE. AT COMPLETION ANASTOMOSIS THE PROXIMAL AORTIC CLAMP WAS REMOVED AND THE ANASTOMOSIS WAS FOUND TO BE HEMOSTATIC. FLOW WAS THEN RETURNED TO THE LOWER EXTREMITIES. THE PATIENT WAS BRIEFLY HYPOTENSIVE WHEN OPENING THE DISTAL CLAMP AND THIS EVENTUALLY RECOVERED. ATTENTION WAS THEN TURNED TO THE DISTAL ANASTOMOSIS. THE GRAFT WAS ROUTED SUPERIORLY AROUND THE THIRD AND FOURTH PORTIONS OF THE DUODENUM. THE SUPERIOR MESENTERIC ARTERY WAS CONTROLLED PROXIMALLY AND DISTALLY WITH VESSEL LOOPS. A LONGITUDINAL OPENING WAS MADE IN THE ARTERY. THE ARTERY HAD A NICE WIDE LUMEN. THE VEIN WAS CUT TO LENGTH SO THAT THERE WAS A SMALL AMOUNT OF PLAY IN THE VEIN SO THAT HE WOULD HAVE SOME MOBILITY BUT ALSO TAKING CARE THAT IT WAS NOT SO LOOSE THAT IT WOULD KINK. THE VEIN WAS SPATULATED AND SEWN END OF VEIN TO SIDE OF ARTERY USING RUNNING 6-0 PROLENE SUTURE. JUST PRIOR COMPLETION ANASTOMOSIS IT WAS FOREBLED BACKBLED AND THOROUGHLY FLUSHED. ANASTOMOSIS WAS SECURED CLAMPS WERE RELEASED OFF OF THE BYPASS GRAFT AS WELL AS THE VESSEL LOOPS ON THE SMA. THERE IS GOOD PULSATILE FLOW IN THE DISTAL SMA IMMEDIATELY. I DID NOT CLOSE ANY OF THE RETROPERITONEUM FOR CONCERNS OF KINKING THE BYPASS GRAFT. A LARGE PORTION OF THE SMALL BOWEL THEN DEVELOPED A RUBOROUS REPERFUSION-TYPE APPEARANCE. THERE WERE SEVERAL SEGMENTS THAT WERE FRANKLY GANGRENOUS OF THE SMALL BOWEL. AT THIS POINT GENERAL SURGERY CAME INTO THE  OPERATING ROOM AND 2 SEGMENTS OF SMALL BOWEL REMOVED LEAVING THE BOWEL IN DISCONTINUITY. THE REMAINDER OF THE BOWEL WAS LEFT INTACT COMING BACK FOR A SECOND LOOK LAPAROTOMY AFTER THE PATIENT WAS LESS COAGULOPATHIC MORE STABLE AND TO DETERMINE BOWEL VIABILITY OVERALL. A VAC DRESSING WAS PLACED ON THE ABDOMEN BY GENERAL SURGERY. THE PATIENT WAS THEN TAKEN TO THE ICU IN CRITICAL BUT STABLE CONDITION. INSTRUMENT SPONGE AND NEEDLE COUNTS WERE CORRECT IN THE CASE.  Ruta Hinds, MD [facility] Office: [phone] Pager: [phone]

## 2016-04-15 NOTE — Progress Notes (Signed)
eLink Physician-Brief Progress Note Patient Name: Amy [name] DOB: 1941/03/27 MRN: [id number]   Date of Service  04/07/2016  HPI/Events of Note  Ongoing shock.  4 units FFP to be given now.  eICU Interventions  Start vasopressin Repeat cbc and coags as soon as FFP complete     Intervention Category Major Interventions: Hypotension - evaluation and management  Mauri Brooklyn, P 03/22/2016, 7:31 PM

## 2016-04-15 NOTE — Progress Notes (Signed)
Coags slowly improving with FFP Overall a little more stable but still requiring significant neo and volume  Would continue to correct coagulapathy in hopes that as oozing stops she will become more stable  2nd look to be determined by General surgery to check viability of remaining bowel  Family updated.  Ruta Hinds, MD [facility] Office: [phone] Pager: [phone]

## 2016-04-15 NOTE — Progress Notes (Signed)
eLink Physician-Brief Progress Note Patient Name: Amy [name] DOB: [birth year] MRN: [id number]   Date of Service  04/08/2016  HPI/Events of Note  Ongoing shock with active bleeding and coagulopathy.    eICU Interventions  Transfuse 4 units ffp     Intervention Category Major Interventions: Hypotension - evaluation and management  Mauri Brooklyn, P 04/08/2016, 7:05 PM

## 2016-04-15 NOTE — Anesthesia Procedure Notes (Signed)
Procedure Name: Intubation Date/Time: 03/19/2016 7:58 AM Performed by: Clearnce Sorrel Pre-anesthesia Checklist: Patient identified, Emergency Drugs available, Suction available, Patient being monitored and Timeout performed Patient Re-evaluated:Patient Re-evaluated prior to inductionOxygen Delivery Method: Circle system utilized Preoxygenation: Pre-oxygenation with 100% oxygen Intubation Type: IV induction Ventilation: Mask ventilation without difficulty Laryngoscope Size: Mac and 3 Grade View: Grade I Tube type: Subglottic suction tube Tube size: 7.0 mm Number of attempts: 1 Airway Equipment and Method: Stylet Placement Confirmation: ETT inserted through vocal cords under direct vision,  positive ETCO2 and breath sounds checked- equal and bilateral Secured at: 22 cm Tube secured with: Tape Dental Injury: Teeth and Oropharynx as per pre-operative assessment

## 2016-04-15 NOTE — Progress Notes (Signed)
PULMONARY / CRITICAL CARE MEDICINE   Name: Amy [name] MRN: [id number] DOB: November 09, 1940    ADMISSION DATE:  03/28/2016 CONSULTATION DATE:  7/23  REFERRING MD:  [facility] per family request   CHIEF COMPLAINT:  Acute encephalopathy and progressive respiratory failure   HISTORY OF PRESENT ILLNESS:   This is a 75 year old white female who was initially admitted to [facility] hospital where she underwent an elective loop colostomy takedown on 7/18. Her immediate post-op course was c/b lethargy and actually required narcan in PACU. She never really improved w/ hospital notes continuing to raise concern for altered mental status and difficulty to arouse. She ultimately required emergent intubation for hypercarbia (CO2 52) and inability to protect airway. She was transferred per family request on 7/23 for further evaluation and care. MRI confirmed new CVA. Suspect this was multifactorial: CVA, meds, +/- pneumonia. She was extubated on 7/24 and transferred to the SDU. On 7/28, she developed worsening abdominal pain and leukocytosis. 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 On 7/29, she underwent aorto to superior mesenteric artery bypass with greater saphenous vein graft and small bowel resection. In the immediate post-op period, she became hypotensive to the 50s and was started on pressors. She was transferred back to [facility] service.  SUBJECTIVE: Cannot obtain- Pt intubated and sedated post-op.   VITAL SIGNS: BP (!) 76/51   Pulse 74   Temp 97 F (36.1 C) (Axillary)   Resp (!) 26   Ht '5\' 6"'$  (1.676 m)   Wt 64.7 kg (142 lb 10.2 oz)   SpO2 100%   BMI 23.02 kg/m   HEMODYNAMICS:    VENTILATOR SETTINGS: Vent Mode: PRVC FiO2 (%):  [100 %] 100 % Set Rate:  [26 bmp] 26 bmp Vt Set:  [470 mL] 470 mL PEEP:  [5 cmH20] 5 cmH20 Plateau Pressure:  [15 cmH20] 15 cmH20  INTAKE / OUTPUT: I/O last 3 completed shifts: In: 3065 [I.V.:1865; IV  Piggyback:1200] Out: 2900 [Urine:2900]  PHYSICAL EXAMINATION: General:  Elderly 75 year old female, appears critically ill Neuro:  Sedated, does not open eyes, does not follow commands HEENT:  ETT in place, dry MM Cardiovascular:  Irregular rhythm, normal rate, no murmurs Lungs:  Diminished breath sounds throughout Abdomen:  Large vertical open incision present with wound vac in place  Musculoskeletal:  No edema Skin:  Warm and dry, no rashes on exposed skin  LABS:  BMET  Recent Labs Lab 04/13/16 0024 04/14/16 0500 03/18/2016 0330 03/27/2016 1018 04/06/2016 1210  NA 143 147* 145 147* 147*  K 3.7 3.2* 3.1* 3.9 4.3  CL 114* 115* 117*  --   --   CO2 21* 25 25  --   --   BUN 27* 28* 22*  --   --   CREATININE 0.96 0.79 0.68  --   --   GLUCOSE 133* 119* 114*  --   --     Electrolytes  Recent Labs Lab 04/10/16 0345  04/11/16 0410 04/12/16 0355 04/13/16 0024 04/13/16 0600 04/14/16 0500 03/21/2016 0330  CALCIUM 7.7*  < > 7.9* 8.1* 8.3*  --  8.3* 7.9*  MG 1.4*  --  2.8* 2.6*  --  2.4 2.4 2.1  PHOS 2.0*  --  3.3 3.0  --   --   --   --   < > = values in this interval not displayed.  CBC  Recent Labs Lab 04/13/16 0600 04/14/16 0500 03/22/2016 0330 04/06/2016 1018  03/21/2016 1210  WBC 43.7* 67.2* 75.2*  --   --   HGB 9.3* 9.7* 9.1* 6.1* 6.5*  HCT 28.5* 29.9* 27.3* 18.0* 19.0*  PLT 278 287 286  --   --     Coag's  Recent Labs Lab 04/14/2016 1515 04/14/16 1300 04/06/2016 0328  APTT  --  37* 193*  INR 1.12  --  3.10    Sepsis Markers  Recent Labs Lab 04/01/2016 1515  04/10/16 0345 04/11/16 0410  04/14/16 0924 04/14/16 2003 04/03/2016 0330  LATICACIDVEN 2.3*  < >  --   --   < > 1.4 1.4 1.1  PROCALCITON 0.95  --  0.75 0.65  --   --   --   --   < > = values in this interval not displayed.  ABG  Recent Labs Lab 03/30/2016 0607 03/28/2016 1018 03/19/2016 1210  PHART 7.410 7.415 7.380  PCO2ART 36.9 32.2* 33.5*  PO2ART 59.0* 144.0* 154.0*    Liver Enzymes  Recent  Labs Lab 04/13/16 0024 04/14/16 0500 04/02/2016 0330  AST 123* 260* 187*  ALT 159* 200* 163*  ALKPHOS 111 102 95  BILITOT 1.5* 1.7* 1.6*  ALBUMIN 2.3* 2.1* 1.9*    Cardiac Enzymes  Recent Labs Lab 03/20/2016 1515 04/12/2016 2140 04/10/16 0430  TROPONINI 1.95* 0.63* 0.64*    Glucose  Recent Labs Lab 04/14/16 0917 04/14/16 1159 04/14/16 1525 04/14/16 2106 04/10/2016 0107 04/11/2016 0455  GLUCAP 117* 137* 81 123* 118* 123*    Imaging Dg Chest Port 1 View  Result Date: 04/06/2016 CLINICAL DATA:  Central line placement EXAM: PORTABLE CHEST 1 VIEW COMPARISON:  04/13/2016 FINDINGS: Right central line remains in place with the tip in the SVC. Endotracheal tube is now placed with the ti no acute bony abnormality. P 3 cm above the carina. NG tube has been placed into the stomach. Heart is normal size. Layering left pleural effusion. Left lower lobe atelectasis. Stable right lower lobe pleural thickening with surgical clips in the region. No acute bony abnormality. IMPRESSION: Layering left pleural effusion with left lower lobe atelectasis. Endotracheal tube 3 cm above the carina. NG tube enters the stomach. Right central line is unchanged. Electronically Signed   By: Rolm Baptise M.D.   On: 04/06/2016 15:13    STUDIES:  CT head (at [facility]): negative  MR brain  7/23>>> Bilateral cerebral hemispheres and tight cerebellar hemisphere patchy acute and early subacute infarcts. No mass effect or hemorrhage. Central thromboembolic source is suspected.  EEG 7/23>>> Abnormal due to moderate diffuse slowing of the waking background. No seizure or epiletiform discharges.  ECHO 7/24 >> EF 50-55%, lateral wall appears hypokinetic, systolic function normal, mild MR regurg Carotid US >> right mild soft plaque CCA, mild to moderate plaque origin and proximal ICA and ECA 1-39% ICA stenosis.   CULTURES: Sputum 7/23: GPC clusters>>> Abundant MRSA (sensitive to Vanc) MRSA screen 7/22: positive  UC  7/23>>> Negative BC 7/23>> NGTD  ANTIBIOTICS: vanc 7/23>>> meropenem 7/23>>> Diflucan 7/27 >>   SIGNIFICANT EVENTS: Loop colostomy takedown 7/18 7/18-7/22: progressively lethargic  7/23 transferred to [facility] s/p getting intubated for AMS and hypercarbia. Surgical service consulted at [facility] on arrival  7/24 on CCB gtt for af. But fully awake and following commands. Extubated. 7/25 SLP evaluation for diet, ASA started, FEES 7/28 developed worsening abdominal pain, leukocytosis, CT showing complete SMA occlusion 7/29 Underwent aorto to SMA bypass with greater saphenous vein graft and small bowel resection 7/29 Developed hypotension to the 50s in post-op  period, started on pressors 7/29 Code status changed to DNR  LINES/TUBES: OETT 7/23>>>7/24; 7/29 >> CVC RIJ 7/19 >> A line left radial 7/29 >>  Urinary catheter 7/29 >>  DISCUSSION: 75 year old white female who was initially admitted to [facility] hospital where she underwent an elective loop colostomy takedown on 7/18. Her immediate post-op course was c/b lethargy and actually required narcan in PACU. She never really improved w/ hospital notes continuing to raise concern for altered mental status and difficulty to arouse. MRI confirmed new CVA. Suspect this was multifactorial: CVA, meds, +/- pneumonia. She was extubated on 7/24 and transferred to the SDU. On 7/28, she developed worsening abdominal pain and leukocytosis. 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 On 7/29, she underwent aorto to superior mesenteric artery bypass with greater saphenous vein graft and small bowel resection. In the immediate post-op period, she became hypotensive to the 50s and was started on pressors. She was transferred back to [facility] service.  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 records in remission  Extubated 7/24, re-intubated 7/29 for surgery P:   Full vent support Decrease RR to 16 Repeat ABG now Pulmicort bid, Duonebs tid  CARDIOVASCULAR A:  Shock- on pressors AF- currently rate controlled H/O HTN  Troponin elevation (stable) H/o bilateral carotid artery stenosis  P:  Continue Neo A line in place Telemetry  RENAL A:   Metabolic acidosis (NAG) >> resolved Severe hypokalemia >> resolved  Hypophosphatemia>> resolved Hypomagnesemia>> resolved Hyperchloremic non-anion gap metabolic acidosis  Hypokalemia P:   S/p 1/2 amp of bicarb CMET now Start IVFs 1/2 NS Replacing potassium Strict I&O  GASTROINTESTINAL A:   Post-op colostomy take-down (w/ remote h/o diverticular disease)- 7/18 Mesenteric ischemia- post-op aorto to SMA bypass and small bowel resection 7/29- open abdomen with wound vac in place Post-op ileus  Shock liver P:   CMET now Trend lactic acid every 3 hours Pepcid IV bid  HEMATOLOGIC A:   Leukocytosis  Anemia- secondary to acute blood loss and chronic illness DVT prophylaxis Cardiothrombotic CVA P:  Give 2 units FFP and 2 units pRBCs now CBCs q8hrs  INFECTIOUS A:   Possible HCAP--> sputum culture from 7/23 growing MRSA Leukocytosis  P:   Continue Vanc and Merrem Trend WBC with q8hr CBCs  ENDOCRINE A:   Hypothyroidism (TSH 28.2) Hypoglycemia P:   Continue synthroid 44 mcgs. Will need repeat TSH on 8/6 CBG monitoring  NEUROLOGIC A:   Sedation on vent Acute and early subacute ischemic infarcts of bilateral cerebral hemispheres and right cerebellar hemispheres P:   Neurology following for stroke Sedation with Fentanyl gtt and Versed pushes  RASS goal: -2  FAMILY  - Updated 7/29 by Dr. Titus Mould.  - Discussed code status with family- decision made to make Pt DNR.   Hyman Bible, MD Family Medicine, PGY-2  STAFF NOTE: I, Merrie Roof, MD FACP have personally reviewed  patient's available data, including medical history, events of note, physical examination and test results as part of my evaluation. I have discussed with resident/NP and other care providers such as pharmacist, RN and RRT. In addition, I personally evaluated patient and elicited key findings of: post op sedated, shock, abdo open with vac, some bloody NGT output, lower ext perfused, shock on neo, last abg acidotic,clinical scenario  of ischemic bowel s/p bypass of SMA, now in icu post op MODS, coagulapthy, bleeding, acidosis, shock, prognosis is concerning also with additional segment s bowel at risk ischemia, neo to max 200 then if fails would change to levophed, wouild avoid vaso with bowel ischemia, stat Tx prbc, ffp, plat appreciate vascualr help providing , lactic stat, ldh, follow output ngt, ppi IV add, change sedation off precedex to fent, no WUA, abg last reviewed, increase Rate, repeat abg, concern is for continued cycle of dilutional, consumptive coagualpthy and bleeding, will re assess coags cbc post Tx, avoid hypothermia, correct ph, I have had extensive discussions with family husband. We discussed patients current circumstances and organ failures. We also discussed patient's prior wishes under circumstances such as this. Family has decided to NOT perform resuscitation if arrest but to continue current medical support for now. Of course NP status to remain, may need versed and parlaysis, get cvp, resus with prbc  The patient is critically ill with multiple organ systems failure and requires high complexity decision making for assessment and support, frequent evaluation and titration of therapies, application of advanced monitoring technologies and extensive interpretation of multiple databases.   Critical Care Time devoted to patient care services described in this note is90 Minutes. This time reflects time of care of this signee: Merrie Roof, MD FACP. This critical care time does not  reflect procedure time, or teaching time or supervisory time of PA/NP/Med student/Med Resident etc but could involve care discussion time. Rest per NP/medical resident whose note is outlined above and that I agree with   Lavon Paganini. Titus Mould, MD, [place] Pgr: [facility] 03/28/2016 4:38 PM

## 2016-04-15 NOTE — H&P (View-Only) (Signed)
Vascular and Vein Specialist of [facility]  Patient name: Amy [name] MRN: [id number] DOB: 1940/11/13 Sex: female  REASON FOR CONSULT: SMA thrombosis;  consult is from Dr. Kieth Brightly  HPI: Amy [name] is a 75 y.o. female, who presents for evaluation of abdominal pain x 1 day. 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 Her KUB was suggestive of an ileus. She is s/p elective colostomy takedown at [facility] on 04/04/16. Her post-op course was complicated altered mental status, lethargy and respiratory distress requiring emergent intubation. She was transferred to [facility] on 04/10/2016 per family's request.   The patient was found to have new onset atrial fibrillation. 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 t-PA was not administered due to recent surgery. Her CXR was concerning for pneumonia and she was started on antibiotics. Steroids were administered for possible acute COPD exacerbation. The patient was extubated on 04/10/16. The patient's mental status has steadily improved. Her carotid duplex revealed less than 40% left ICA stenosis. Her right ICA was not visualized secondary to bandages and central line in neck. Eliquis was started on 04/11/16.   Today, the patient denies any abdominal pain. She had small BM yesterday. She denies any nausea. She is hungry and thirsty. Her stepdaughter is present and is inquiring about a diet.   She has a past medical history of hypertension, COPD, history of non-small cell lung cancer s/p RUL lobectomy, OSA (intolerant of CPAP) and hypothyroidism.   Past Medical History:  Diagnosis Date  . Bilateral carotid artery stenosis   . COPD (chronic obstructive pulmonary disease) ([place])   . GERD (gastroesophageal reflux disease)   . HTN (hypertension)   . Hypothyroidism   . Non-small cell lung cancer ([place])   . OSA (obstructive sleep apnea)    intolerant of CPAP    No family history on file.  SOCIAL  HISTORY: Social History   Social History  . Marital status: Married    Spouse name: N/A  . Number of children: N/A  . Years of education: N/A   Occupational History  . Not on file.   Social History Main Topics  . Smoking status: Not on file  . Smokeless tobacco: Not on file  . Alcohol use Not on file  . Drug use: Unknown  . Sexual activity: Not on file   Other Topics Concern  . Not on file   Social History Narrative  . No narrative on file    Allergies  Allergen Reactions  . Ativan [Lorazepam] Other (See Comments)    Confusion and agitation   . Ace Inhibitors Cough  . Codeine Nausea Only  . Morphine And Related     Went crazy  . Penicillins Itching    No other information available at this time 03/27/2016  . Prednisone Other (See Comments)    Head aches   . Spiriva Handihaler [Tiotropium Bromide Monohydrate] Other (See Comments)    Urinary retention     Current Facility-Administered Medications  Medication Dose Route Frequency Provider Last Rate Last Dose  . antiseptic oral rinse (CPC / CETYLPYRIDINIUM CHLORIDE 0.05%) solution 7 mL  7 mL Mouth Rinse q12n4p Allie Bossier, MD   7 mL at 04/13/16 1600  . apixaban (ELIQUIS) tablet 5 mg  5 mg Oral BID Erick Colace, NP   5 mg at 04/13/16 2300  . aspirin chewable tablet 81 mg  81 mg Oral Daily Erick Colace, NP   81 mg at 04/13/16  1059  . budesonide (PULMICORT) nebulizer solution 0.5 mg  0.5 mg Nebulization BID Erick Colace, NP   0.5 mg at 04/13/16 1934  . chlorhexidine (PERIDEX) 0.12 % solution 15 mL  15 mL Mouth Rinse BID Allie Bossier, MD   15 mL at 04/13/16 2300  . dextrose 5 %-0.9 % sodium chloride infusion   Intravenous Continuous Gardiner Barefoot, NP 60 mL/hr at 04/14/16 0659    . famotidine (PEPCID) tablet 20 mg  20 mg Oral BID Karren Cobble, RPH   20 mg at 04/13/16 2300  . fentaNYL (SUBLIMAZE) injection 12.5 mcg  12.5 mcg Intravenous Q2H PRN Erick Colace, NP   12.5 mcg at 04/13/16 0001  .  fentaNYL (SUBLIMAZE) injection 50 mcg  50 mcg Intravenous Q15 min PRN Erick Colace, NP   50 mcg at 04/10/16 0735  . fluconazole (DIFLUCAN) IVPB 200 mg  200 mg Intravenous Q24H Allie Bossier, MD   200 mg at 04/13/16 2300  . hydrALAZINE (APRESOLINE) injection 5 mg  5 mg Intravenous Q4H PRN Allie Bossier, MD      . insulin aspart (novoLOG) injection 0-9 Units  0-9 Units Subcutaneous Q4H Gardiner Barefoot, NP      . ipratropium-albuterol (DUONEB) 0.5-2.5 (3) MG/3ML nebulizer solution 3 mL  3 mL Nebulization TID Allie Bossier, MD   3 mL at 04/14/16 0847  . levothyroxine (SYNTHROID, LEVOTHROID) tablet 88 mcg  88 mcg Oral QAC breakfast Karren Cobble, RPH   88 mcg at 04/13/16 1059  . meropenem (MERREM) 1 g in sodium chloride 0.9 % 100 mL IVPB  1 g Intravenous Q8H Crystal Trellis Moment, RPH   1 g at 04/14/16 0500  . metoprolol (LOPRESSOR) injection 5 mg  5 mg Intravenous Q6H Allie Bossier, MD   5 mg at 04/14/16 0600  . mupirocin ointment (BACTROBAN) 2 % 1 application  1 application Nasal BID Tanda Rockers, MD   1 application at 17/00/17 2300  . ondansetron (ZOFRAN) injection 4 mg  4 mg Intravenous Q6H PRN Karsten Fells Kirby-Graham, NP      . potassium chloride 10 mEq in 100 mL IVPB  10 mEq Intravenous Q1 Hr x 4 Allie Bossier, MD      . RESOURCE THICKENUP CLEAR   Oral PRN Erick Colace, NP      . sodium chloride flush (NS) 0.9 % injection 10-40 mL  10-40 mL Intracatheter Q12H Rigoberto Noel, MD   10 mL at 04/13/16 2200  . sodium chloride flush (NS) 0.9 % injection 10-40 mL  10-40 mL Intracatheter PRN Rigoberto Noel, MD      . vancomycin (VANCOCIN) IVPB 750 mg/150 ml premix  750 mg Intravenous Q24H Theone Murdoch Hammons, RPH   750 mg at 04/13/16 1754    REVIEW OF SYSTEMS:  '[X]'$  denotes positive finding, '[ ]'$  denotes negative finding Cardiac  Comments:  Chest pain or chest pressure:    Shortness of breath upon exertion:    Short of breath when lying flat:    Irregular heart rhythm: x         Vascular    Pain in calf, thigh, or hip brought on by ambulation:    Pain in feet at night that wakes you up from your sleep:     Blood clot in your veins:    Leg swelling:         Pulmonary    Oxygen at home:  Productive cough:     Wheezing:         Neurologic    Sudden weakness in arms or legs:     Sudden numbness in arms or legs:     Sudden onset of difficulty speaking or slurred speech:    Temporary loss of vision in one eye:     Problems with dizziness:         Gastrointestinal    Blood in stool:     Vomited blood:         Genitourinary    Burning when urinating:     Blood in urine:        Psychiatric    Major depression:         Hematologic    Bleeding problems:    Problems with blood clotting too easily:        Skin    Rashes or ulcers:        Constitutional    Fever or chills:      PHYSICAL EXAM: Vitals:   04/13/16 1934 04/13/16 2249 04/14/16 0315 04/14/16 0316  BP:  (!) 161/43 (!) 141/60   Pulse:  (!) 53 (!) 130 76  Resp:  '14 17 16  '$ Temp:  97.4 F (36.3 C) 97.4 F (36.3 C)   TempSrc:  Axillary Axillary   SpO2: 100% 100% 100% 100%  Weight:      Height:        GENERAL: The patient is a well-nourished female, in no acute distress. The vital signs are documented above. CARDIAC: Irregularly irregular, no carotid bruits VASCULAR: 2+ femoral pulses bilaterally. Feet well perfused.  PULMONARY: Non-labored respiratory effort. Lungs sound clear.  ABDOMEN: bowel sounds present, soft, no distension, mild tenderness to RUQ. Midline incision clean. L abdominal bandage clean.  MUSCULOSKELETAL: There are no major deformities or cyanosis. NEUROLOGIC: No focal weakness or paresthesias are detected. SKIN: There are no ulcers or rashes noted. PSYCHIATRIC: The patient has a normal affect.  DATA:  CT Abdomen/Pelvis 04/13/16 Vascular/Lymphatic: Moderate to large amount of eccentric mixed calcified and noncalcified atherosclerotic plaque within a  normal caliber abdominal aorta. Interval development of occlusion of the origin approximately 3.2 cm of the the SMA (representative axial images 27 through 30; sagittal images 67 through 69, series 6). This represents an interval change compared to recent CT scan performed 02/10/2016 at which time a moderate high-grade stenosis was suspected on that non CTA examination.). 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 The IMA is patent. Reproductive: Post hysterectomy.  No discrete adnexal lesion.  IMPRESSION: 1. Interval takedown of left lower quadrant loop colostomy. 2. 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 While the small bowel distention could be attributable to developing small bowel obstruction, given occlusion of the SMA, developing ischemic change is favored. No definite pneumatosis or portal venous gas. 3. Altered perfusion involving the liver and spleen, likely secondary to collateralized flow from the celiac artery. 4.  Aortic Atherosclerosis (ICD10-170.0) 5. Interval development of small bilateral effusions, left greater than right.  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 CT yesterday showing small bowel distension. There is mild RUQ pain. Abdomen is soft without distension. A moderate to high-grade stenosis was suspected on CT from 02/10/16 suggesting chronic  disease. The IMA and celiac are diseased but appear patent. No CTA in the records. The patient is afebrile. Leukocytosis of 67.2, up from 43.7 yesterday. This may partly be due to steroid use.  No plans for surgical intervention as her clinical exam is not consistent with ischemic bowel. Continue NPO and antibiotics. She is currently on Eliquis for  new atrial fibrillation and recent bilateral embolic strokes.   Other active problems: new onset atrial fibrillation, bilateral CVA, COPD exacerbation, HCAP, hypertension, elevated troponin.   Will need to evaluate right internal carotid artery at some point in the future as this was not studied in recent duplex.   Virgina Jock, PA-C [facility] [place] [phone]  Addendum:   History as above Pt seen at 4 pm today.  Clinical situation discussed with Dr Georgette Dover.  On my exam her abdomen is diffusely tender.  She has multiple significant co morbidities including recent stroke, severe protein calorie malnutrition, pneumonia.  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  Her SMA and celiac branches do fill distally.  She is not a candidate for thrombolysis with recent laparotomy.  I would not consider an SMA stent due to risk of distal embolization of this fresh thrombus.  I believe best option would be aorta to SMA bypass originating from the infrarenal aorta.  This will inevitably be a very high risk procedure with high morbidity and mortality in light of currently clinical situation.  I have spoke with the pharmacy and the patient has been receiving Eliquis for several days.  Will give this overnight so that most of it will have a chance to wash out.  Start IV heparin as soon as the pharmacy believes it is safe.  Will plan on continuing the heparin no need to stop prior to OR.  I have discussed plan with pt but I do not believe her current mental status comprehends this.  I spoke with her husband and several family members including 2 daughters who are nurses.  Discussed details of operation as well of risk/benefits including but not limited to worsening infection, bowel resection possible ostomy, death, ventilator dependence as well as need but possible  consequences of delay due to Eliquis.  They understand and wish to proceed with operation.  Will check ABG now to have baseline and continue to follow lactate levels.  Type and screen.  FFP, platelets, RBC  Ruta Hinds, MD [facility]ists of [place] Office: [phone] Pager: [phone]

## 2016-04-15 NOTE — Anesthesia Postprocedure Evaluation (Signed)
Anesthesia Post Note  Patient: Amy [name]  Procedure(s) Performed: Procedure(s) (LRB): Exploratory Laparatomy; Aorto to Superior Mesenteric Artery Bypass with Reversed Greater Saphenous Vein Graft  (N/A) SMALL BOWEL RESECTION with application of open abdominal wound vac (N/A)  Patient location during evaluation: SICU Anesthesia Type: General Level of consciousness: sedated Pain management: pain level controlled Vital Signs Assessment: post-procedure vital signs reviewed and stable Respiratory status: patient remains intubated per anesthesia plan Cardiovascular status: unstable Anesthetic complications: no Comments: Patient still requiring vasopressor medications and needing RBC and FFP replacement currently on arrival to SICU.. Dr. Oneida Alar and Critical care team is at bedside.     Last Vitals:  Vitals:   04/03/2016 1435 04/06/2016 1635  BP: (!) 76/51   Pulse: 74   Resp: (!) 26   Temp:  (!) 32.8 C    Last Pain:  Vitals:   03/29/2016 0445  TempSrc:   PainSc: Asleep                 Zenaida Deed

## 2016-04-15 NOTE — Progress Notes (Signed)
Consulted to dose TPN in pt with massive bowel resection. D/w RN: it is past deadline for new TPNs to be mixed in pharmacy and pt currently hypotensive, hypothermic and no IV access available for TPN. Plan: order TPN labs for am, TPN will start 7/30 at [place], Pharm.D. [phone] 03/29/2016 3:14 PM

## 2016-04-15 NOTE — Progress Notes (Signed)
Received call from [facility] regarding critical lab value INR 5.4 and notified Dr. Tamala Julian eMD.

## 2016-04-15 NOTE — Progress Notes (Signed)
MD notified of critical lab values, WBC 75.2, PTT 193, and Heparin level >2.2.  MD confirmed receipt of results.   Dr. Oneida Alar notified of PTT and Heparin levels since patient is going to surgery this morning.  Verbal orders given to cut rate in half.  See MAR.

## 2016-04-15 NOTE — Progress Notes (Signed)
eLink Physician-Brief Progress Note Patient Name: Amy [name] DOB: 1940-10-11 MRN: [id number]   Date of Service  04/08/2016  HPI/Events of Note  Bp improved with blood product support and initiation of vasopressin but there continues to be a large amount of blood from wound vac.  eICU Interventions  Cbc, PT/INR, abg now and notify surgery of bleeding     Intervention Category Major Interventions: Shock - evaluation and management  Mauri Brooklyn, P 03/18/2016, 10:10 PM

## 2016-04-15 NOTE — Interval H&P Note (Signed)
History and Physical Interval Note:  03/20/2016 7:42 AM  Amy [name]  has presented today for surgery, with the diagnosis of Ischemia  The various methods of treatment have been discussed with the patient and family. After consideration of risks, benefits and other options for treatment, the patient has consented to  Procedure(s): MESENTERIC ARTERY BYPASS (N/A) as a surgical intervention .  The patient's history has been reviewed, patient examined, no change in status, stable for surgery.  I have reviewed the patient's chart and labs.  Questions were answered to the patient's satisfaction.     Ruta Hinds

## 2016-04-15 NOTE — Brief Op Note (Signed)
04/08/2016 - 04/14/2016  2:20 PM  PATIENT:  Amy [name]  75 y.o. female  PRE-OPERATIVE DIAGNOSIS:  Ischemic bowel; SMA thrombus  POST-OPERATIVE DIAGNOSIS:  Necrotic small bowel, SMA thrombus   PROCEDURE:  Procedure(s): Exploratory Laparatomy; Aorto to Superior Mesenteric Artery Bypass with Reversed Greater Saphenous Vein Graft  (N/A) SMALL BOWEL RESECTION with application of open abdominal wound vac (N/A)  SURGEON:  Surgeon(s) and Role: Panel 1:    * Elam Dutch, MD - Primary  Panel 2:    * Greer Pickerel, MD - Primary  PHYSICIAN ASSISTANT:   ASSISTANTS: none   ANESTHESIA:   general  EBL:  Total I/O In: 3170 [I.V.:2000; Blood:670; IV [id number]:226] Out: 3335 [Urine:610; Other:400; Blood:600]  BLOOD ADMINISTERED:none  DRAINS: Nasogastric Tube and Urinary Catheter (Foley)   LOCAL MEDICATIONS USED:  NONE  SPECIMEN:  Source of Specimen:  small bowel  DISPOSITION OF SPECIMEN:  PATHOLOGY  COUNTS:  YES  TOURNIQUET:  * No tourniquets in log *  DICTATION: .Other Dictation: Dictation Number [id number]  PLAN OF CARE: icu  PATIENT DISPOSITION:  ICU - intubated and critically ill.   Delay start of Pharmacological VTE agent (>24hrs) due to surgical blood loss or risk of bleeding: yes  Leighton Ruff. Redmond Pulling, MD, FACS General, Bariatric, & Minimally Invasive Surgery [facility], [place]

## 2016-04-16 ENCOUNTER — Inpatient Hospital Stay (HOSPITAL_COMMUNITY): Payer: Medicare Other | Admitting: Anesthesiology

## 2016-04-16 ENCOUNTER — Inpatient Hospital Stay (HOSPITAL_COMMUNITY): Payer: Medicare Other

## 2016-04-16 ENCOUNTER — Encounter (HOSPITAL_COMMUNITY): Admission: EM | Disposition: E | Payer: Self-pay | Source: Other Acute Inpatient Hospital | Attending: Internal Medicine

## 2016-04-16 DIAGNOSIS — D65 Disseminated intravascular coagulation [defibrination syndrome]: Secondary | ICD-10-CM

## 2016-04-16 HISTORY — PX: LAPAROTOMY: SHX154

## 2016-04-16 HISTORY — PX: BOWEL RESECTION: SHX1257

## 2016-04-16 HISTORY — PX: APPLICATION OF WOUND VAC: SHX5189

## 2016-04-16 LAB — COMPREHENSIVE METABOLIC PANEL
ALBUMIN: 2.5 g/dL — AB (ref 3.5–5.0)
ALK PHOS: 65 U/L (ref 38–126)
ALT: 187 U/L — AB (ref 14–54)
ANION GAP: 3 — AB (ref 5–15)
AST: 676 U/L — ABNORMAL HIGH (ref 15–41)
BILIRUBIN TOTAL: 2.9 mg/dL — AB (ref 0.3–1.2)
BUN: 21 mg/dL — ABNORMAL HIGH (ref 6–20)
CALCIUM: 8.1 mg/dL — AB (ref 8.9–10.3)
CO2: 24 mmol/L (ref 22–32)
CREATININE: 0.96 mg/dL (ref 0.44–1.00)
Chloride: 118 mmol/L — ABNORMAL HIGH (ref 101–111)
GFR calc Af Amer: >60 mL/min (ref >60)
GFR calc non Af Amer: 56 mL/min — ABNORMAL LOW (ref >60)
GLUCOSE: 128 mg/dL — AB (ref 65–99)
Potassium: 3.2 mmol/L — ABNORMAL LOW (ref 3.5–5.1)
Sodium: 145 mmol/L (ref 135–145)
TOTAL PROTEIN: 4.4 g/dL — AB (ref 6.5–8.1)

## 2016-04-16 LAB — PROTIME-INR
INR: 1.89
INR: 1.75
INR: 1.91
INR: 1.6
PROTHROMBIN TIME: 22 s — AB (ref 11.4–15.2)
PROTHROMBIN TIME: 20.7 s — AB (ref 11.4–15.2)
Prothrombin Time: 22.1 seconds — ABNORMAL HIGH (ref 11.4–15.2)
Prothrombin Time: 19.3 seconds — ABNORMAL HIGH (ref 11.4–15.2)

## 2016-04-16 LAB — GLUCOSE, CAPILLARY
GLUCOSE-CAPILLARY: 130 mg/dL — AB (ref 65–99)
GLUCOSE-CAPILLARY: 127 mg/dL — AB (ref 65–99)
Glucose-Capillary: 106 mg/dL — ABNORMAL HIGH (ref 65–99)
Glucose-Capillary: 109 mg/dL — ABNORMAL HIGH (ref 65–99)
Glucose-Capillary: 137 mg/dL — ABNORMAL HIGH (ref 65–99)
Glucose-Capillary: 50 mg/dL — ABNORMAL LOW (ref 65–99)

## 2016-04-16 LAB — CBC
HCT: 22.2 % — ABNORMAL LOW (ref 36.0–46.0)
HCT: 20.7 % — ABNORMAL LOW (ref 36.0–46.0)
HEMOGLOBIN: 7 g/dL — AB (ref 12.0–15.0)
Hemoglobin: 7.3 g/dL — ABNORMAL LOW (ref 12.0–15.0)
MCH: 28.3 pg (ref 26.0–34.0)
MCH: 29.3 pg (ref 26.0–34.0)
MCHC: 32.9 g/dL (ref 30.0–36.0)
MCHC: 33.8 g/dL (ref 30.0–36.0)
MCV: 86 fL (ref 78.0–100.0)
MCV: 86.6 fL (ref 78.0–100.0)
Platelets: 73 10*3/uL — ABNORMAL LOW (ref 150–400)
Platelets: 71 10*3/uL — ABNORMAL LOW (ref 150–400)
RBC: 2.58 MIL/uL — AB (ref 3.87–5.11)
RBC: 2.39 MIL/uL — ABNORMAL LOW (ref 3.87–5.11)
RDW: 15.4 % (ref 11.5–15.5)
RDW: 15.1 % (ref 11.5–15.5)
WBC: 27.6 10*3/uL — AB (ref 4.0–10.5)
WBC: 23.4 10*3/uL — ABNORMAL HIGH (ref 4.0–10.5)

## 2016-04-16 LAB — CBC WITH DIFFERENTIAL/PLATELET
BASOS ABS: 0 10*3/uL (ref 0.0–0.1)
Basophils Relative: 0 %
EOS ABS: 0.3 10*3/uL (ref 0.0–0.7)
Eosinophils Relative: 1 %
HCT: 25.3 % — ABNORMAL LOW (ref 36.0–46.0)
Hemoglobin: 8.6 g/dL — ABNORMAL LOW (ref 12.0–15.0)
LYMPHS PCT: 3 %
Lymphs Abs: 0.8 10*3/uL (ref 0.7–4.0)
MCH: 29.6 pg (ref 26.0–34.0)
MCHC: 34 g/dL (ref 30.0–36.0)
MCV: 86.9 fL (ref 78.0–100.0)
Monocytes Absolute: 0.5 10*3/uL (ref 0.1–1.0)
Monocytes Relative: 2 %
NEUTROS PCT: 94 %
Neutro Abs: 23.8 10*3/uL — ABNORMAL HIGH (ref 1.7–7.7)
PLATELETS: 38 10*3/uL — AB (ref 150–400)
RBC: 2.91 MIL/uL — ABNORMAL LOW (ref 3.87–5.11)
RDW: 14.7 % (ref 11.5–15.5)
WBC: 25.4 10*3/uL — ABNORMAL HIGH (ref 4.0–10.5)

## 2016-04-16 LAB — PREPARE FRESH FROZEN PLASMA
UNIT DIVISION: 0
UNIT DIVISION: 0
UNIT DIVISION: 0
UNIT DIVISION: 0
UNIT DIVISION: 0
UNIT DIVISION: 0
UNIT DIVISION: 0
UNIT DIVISION: 0
Unit division: 0
Unit division: 0
Unit division: 0
Unit division: 0

## 2016-04-16 LAB — URINE CULTURE: Culture: NO GROWTH

## 2016-04-16 LAB — POCT I-STAT 3, ART BLOOD GAS (G3+)
ACID-BASE DEFICIT: 5 mmol/L — AB (ref 0.0–2.0)
ACID-BASE DEFICIT: 2 mmol/L (ref 0.0–2.0)
BICARBONATE: 23.3 meq/L (ref 20.0–24.0)
Bicarbonate: 19.9 mEq/L — ABNORMAL LOW (ref 20.0–24.0)
O2 SAT: 100 %
O2 SAT: 98 %
PH ART: 7.484 — AB (ref 7.350–7.450)
PO2 ART: 110 mmHg — AB (ref 80.0–100.0)
Patient temperature: 91.6
Patient temperature: 97.4
TCO2: 21 mmol/L (ref 0–100)
TCO2: 25 mmol/L (ref 0–100)
pCO2 arterial: 25.4 mmHg — ABNORMAL LOW (ref 35.0–45.0)
pCO2 arterial: 40.4 mmHg (ref 35.0–45.0)
pH, Arterial: 7.366 (ref 7.350–7.450)
pO2, Arterial: 463 mmHg — ABNORMAL HIGH (ref 80.0–100.0)

## 2016-04-16 LAB — APTT
APTT: 33 s (ref 24–36)
aPTT: 35 seconds (ref 24–36)
aPTT: 35 seconds (ref 24–36)

## 2016-04-16 LAB — PREPARE PLATELET PHERESIS
UNIT DIVISION: 0
Unit division: 0

## 2016-04-16 LAB — PREALBUMIN: Prealbumin: 15 mg/dL — ABNORMAL LOW (ref 18–38)

## 2016-04-16 LAB — PHOSPHORUS: Phosphorus: 2.9 mg/dL (ref 2.5–4.6)

## 2016-04-16 LAB — AMYLASE: AMYLASE: 65 U/L (ref 28–100)

## 2016-04-16 LAB — MAGNESIUM: Magnesium: 1.8 mg/dL (ref 1.7–2.4)

## 2016-04-16 LAB — HEMOGLOBIN AND HEMATOCRIT, BLOOD
HCT: 19.2 % — ABNORMAL LOW (ref 36.0–46.0)
HEMOGLOBIN: 6.6 g/dL — AB (ref 12.0–15.0)

## 2016-04-16 LAB — FIBRINOGEN
FIBRINOGEN: 263 mg/dL (ref 210–475)
FIBRINOGEN: 235 mg/dL (ref 210–475)
FIBRINOGEN: 281 mg/dL (ref 210–475)

## 2016-04-16 LAB — TRIGLYCERIDES: Triglycerides: 84 mg/dL (ref <150)

## 2016-04-16 LAB — PREPARE RBC (CROSSMATCH)

## 2016-04-16 SURGERY — LAPAROTOMY, EXPLORATORY
Anesthesia: General | Site: Abdomen

## 2016-04-16 MED ORDER — SODIUM CHLORIDE 0.9 % IV SOLN
Freq: Once | INTRAVENOUS | Status: AC
Start: 2016-04-16 — End: 2016-04-16
  Administered 2016-04-16: 10 mL/h via INTRAVENOUS

## 2016-04-16 MED ORDER — TRACE MINERALS CR-CU-MN-SE-ZN 10-1000-500-60 MCG/ML IV SOLN
INTRAVENOUS | Status: AC
  Administered 2016-04-16: 19:00:00 via INTRAVENOUS
  Filled 2016-04-16: qty 1000

## 2016-04-16 MED ORDER — MIDAZOLAM HCL 2 MG/2ML IJ SOLN
INTRAMUSCULAR | Status: DC | PRN
  Administered 2016-04-16: 2 mg via INTRAVENOUS

## 2016-04-16 MED ORDER — IPRATROPIUM-ALBUTEROL 0.5-2.5 (3) MG/3ML IN SOLN
3.0000 mL | Freq: Four times a day (QID) | RESPIRATORY_TRACT | Status: DC
  Administered 2016-04-16 – 2016-04-29 (×52): 3 mL via RESPIRATORY_TRACT
  Filled 2016-04-16 (×53): qty 3

## 2016-04-16 MED ORDER — SODIUM CHLORIDE 0.9% FLUSH
10.0000 mL | Freq: Two times a day (BID) | INTRAVENOUS | Status: DC
  Administered 2016-04-16 – 2016-04-17 (×2): 30 mL
  Administered 2016-04-17 – 2016-04-27 (×14): 10 mL
  Administered 2016-04-28: 30 mL
  Administered 2016-04-29: 10 mL

## 2016-04-16 MED ORDER — LEVOTHYROXINE SODIUM 100 MCG IV SOLR
44.0000 ug | Freq: Every day | INTRAVENOUS | Status: DC
  Administered 2016-04-16 – 2016-04-26 (×10): 44 ug via INTRAVENOUS
  Filled 2016-04-16 (×10): qty 5

## 2016-04-16 MED ORDER — FENTANYL BOLUS VIA INFUSION
25.0000 ug | INTRAVENOUS | Status: DC | PRN
  Administered 2016-04-19 – 2016-04-20 (×4): 50 ug via INTRAVENOUS
  Filled 2016-04-16: qty 50

## 2016-04-16 MED ORDER — DEXTROSE-NACL 5-0.45 % IV SOLN
INTRAVENOUS | Status: AC
  Administered 2016-04-16: 50 mL/h via INTRAVENOUS
  Administered 2016-04-16: 19:00:00 via INTRAVENOUS

## 2016-04-16 MED ORDER — POTASSIUM CHLORIDE 10 MEQ/50ML IV SOLN
10.0000 meq | INTRAVENOUS | Status: AC
Start: 2016-04-16 — End: 2016-04-16
  Administered 2016-04-16 (×5): 10 meq via INTRAVENOUS
  Filled 2016-04-16 (×3): qty 50

## 2016-04-16 MED ORDER — ROCURONIUM BROMIDE 50 MG/5ML IV SOLN
INTRAVENOUS | Status: AC
  Filled 2016-04-16: qty 1

## 2016-04-16 MED ORDER — SODIUM CHLORIDE 0.9% FLUSH: 10.0000 mL | INTRAVENOUS | Status: DC | PRN

## 2016-04-16 MED ORDER — ROCURONIUM BROMIDE 100 MG/10ML IV SOLN
INTRAVENOUS | Status: DC | PRN
  Administered 2016-04-16: 50 mg via INTRAVENOUS

## 2016-04-16 MED ORDER — SODIUM CHLORIDE 0.9 % IV SOLN
Freq: Once | INTRAVENOUS | Status: AC
Start: 2016-04-16 — End: 2016-04-16
  Administered 2016-04-16: 07:00:00 via INTRAVENOUS

## 2016-04-16 MED ORDER — MIDAZOLAM HCL 2 MG/2ML IJ SOLN: 1.0000 mg | INTRAMUSCULAR | Status: DC | PRN

## 2016-04-16 MED ORDER — MIDAZOLAM HCL 2 MG/2ML IJ SOLN
INTRAMUSCULAR | Status: AC
  Filled 2016-04-16: qty 2

## 2016-04-16 MED ORDER — 0.9 % SODIUM CHLORIDE (POUR BTL) OPTIME
TOPICAL | Status: DC | PRN
  Administered 2016-04-16 (×4): 1000 mL

## 2016-04-16 MED ORDER — SODIUM CHLORIDE 0.9 % IV SOLN
Freq: Once | INTRAVENOUS | Status: AC
Start: 2016-04-16 — End: 2016-04-16
  Administered 2016-04-16: 18:00:00 via INTRAVENOUS

## 2016-04-16 MED ORDER — MAGNESIUM SULFATE 2 GM/50ML IV SOLN
2.0000 g | Freq: Once | INTRAVENOUS | Status: AC
  Administered 2016-04-16: 2 g via INTRAVENOUS
  Filled 2016-04-16: qty 50

## 2016-04-16 SURGICAL SUPPLY — 49 items
BLADE SURG ROTATE 9660 (MISCELLANEOUS) IMPLANT
CANISTER SUCTION 2500CC (MISCELLANEOUS) ×3 IMPLANT
CHLORAPREP W/TINT 26ML (MISCELLANEOUS) IMPLANT
COVER SURGICAL LIGHT HANDLE (MISCELLANEOUS) ×3 IMPLANT
DRAPE LAPAROSCOPIC ABDOMINAL (DRAPES) ×3 IMPLANT
DRAPE WARM FLUID 44X44 (DRAPE) ×3 IMPLANT
DRSG COVADERM 4X10 (GAUZE/BANDAGES/DRESSINGS) ×3 IMPLANT
DRSG OPSITE POSTOP 4X10 (GAUZE/BANDAGES/DRESSINGS) IMPLANT
DRSG OPSITE POSTOP 4X8 (GAUZE/BANDAGES/DRESSINGS) IMPLANT
ELECT BLADE 6.5 EXT (BLADE) ×3 IMPLANT
ELECT CAUTERY BLADE 6.4 (BLADE) ×3 IMPLANT
ELECT REM PT RETURN 9FT ADLT (ELECTROSURGICAL) ×3
ELECTRODE REM PT RTRN 9FT ADLT (ELECTROSURGICAL) ×1 IMPLANT
GLOVE BIO SURGEON STRL SZ 6 (GLOVE) ×3 IMPLANT
GLOVE BIOGEL PI IND STRL 6.5 (GLOVE) ×2 IMPLANT
GLOVE BIOGEL PI IND STRL 7.0 (GLOVE) ×1 IMPLANT
GLOVE BIOGEL PI INDICATOR 6.5 (GLOVE) ×4
GLOVE BIOGEL PI INDICATOR 7.0 (GLOVE) ×2
GLOVE ECLIPSE 7.5 STRL STRAW (GLOVE) ×3 IMPLANT
GLOVE SURG SS PI 7.0 STRL IVOR (GLOVE) ×3 IMPLANT
GOWN STRL REUS W/ TWL LRG LVL3 (GOWN DISPOSABLE) ×1 IMPLANT
GOWN STRL REUS W/ TWL XL LVL3 (GOWN DISPOSABLE) ×1 IMPLANT
GOWN STRL REUS W/TWL 2XL LVL3 (GOWN DISPOSABLE) ×3 IMPLANT
GOWN STRL REUS W/TWL LRG LVL3 (GOWN DISPOSABLE) ×2
GOWN STRL REUS W/TWL XL LVL3 (GOWN DISPOSABLE) ×2
KIT BASIN OR (CUSTOM PROCEDURE TRAY) ×3 IMPLANT
KIT ROOM TURNOVER OR (KITS) ×3 IMPLANT
LIGASURE IMPACT 36 18CM CVD LR (INSTRUMENTS) ×3 IMPLANT
NS IRRIG 1000ML POUR BTL (IV SOLUTION) ×6 IMPLANT
PACK GENERAL/GYN (CUSTOM PROCEDURE TRAY) ×3 IMPLANT
PAD ABD 8X10 STRL (GAUZE/BANDAGES/DRESSINGS) ×3 IMPLANT
PAD ARMBOARD 7.5X6 YLW CONV (MISCELLANEOUS) ×3 IMPLANT
RELOAD PROXIMATE 75MM BLUE (ENDOMECHANICALS) ×3 IMPLANT
SPECIMEN JAR LARGE (MISCELLANEOUS) IMPLANT
SPONGE ABDOMINAL VAC ABTHERA (MISCELLANEOUS) ×3 IMPLANT
SPONGE LAP 18X18 X RAY DECT (DISPOSABLE) ×3 IMPLANT
STAPLER PROXIMATE 75MM BLUE (STAPLE) ×3 IMPLANT
STAPLER VISISTAT 35W (STAPLE) IMPLANT
SUCTION POOLE TIP (SUCTIONS) ×3 IMPLANT
SUT PDS II 0 TP-1 LOOPED 60 (SUTURE) IMPLANT
SUT VIC AB 2-0 SH 18 (SUTURE) ×3 IMPLANT
SUT VIC AB 3-0 SH 18 (SUTURE) ×3 IMPLANT
SUT VICRYL 4-0 PS2 18IN ABS (SUTURE) IMPLANT
SUT VICRYL AB 2 0 TIES (SUTURE) ×3 IMPLANT
SUT VICRYL AB 3 0 TIES (SUTURE) ×3 IMPLANT
TOWEL OR 17X24 6PK STRL BLUE (TOWEL DISPOSABLE) ×3 IMPLANT
TOWEL OR 17X26 10 PK STRL BLUE (TOWEL DISPOSABLE) ×3 IMPLANT
TRAY FOLEY CATH 16FRSI W/METER (SET/KITS/TRAYS/PACK) IMPLANT
YANKAUER SUCT BULB TIP NO VENT (SUCTIONS) ×3 IMPLANT

## 2016-04-16 NOTE — Plan of Care (Signed)
Problem: Tissue Perfusion: Goal: Risk factors for ineffective tissue perfusion will decrease Outcome: Progressing Pt has improved vascular perfusion assessment tonight  Problem: Fluid Volume: Goal: Ability to maintain a balanced intake and output will improve Outcome: Not Progressing Pt has significant third spacing  Problem: Nutrition: Goal: Adequate nutrition will be maintained Outcome: Progressing TNA started today

## 2016-04-16 NOTE — Progress Notes (Signed)
Pharmacy Antibiotic Note  Amy [name] is a 75 y.o. female admitted on 03/28/2016 with sepsis on day 7 of antibiotic therapy and day 3 of antifungal therapy. During admission, LR's trach aspirate culture was positive for MRSA and yeast. Pharmacy has been consulted for vancomycin, merropenem, and fluconazole dosing. LR was taken to the OR yesterday and again today, and has missed several doses of vancomycin. Therefore, a vanc trough would not be appropriate at this time. Lactate and WBC are elevated, but trending down. She is hypothermic at this time.   Plan: - Continue Vancomycin 750 mg IV every 24 hours. Goal trough 15-20 mcg/mL. Will plan to obtain VT when patient is at steady state  - Continue Merropenem 1 gram IV every 8 hours - Continue Fluconazole 200 mg IV every 24 hours - Will monitor renal, LOT, and signs of clinical improvement   Height: '5\' 6"'$  (167.6 cm) Weight: 156 lb 4.9 oz (70.9 kg) IBW/kg (Calculated) : 59.3  Temp (24hrs), Avg:97 F (36.1 C), Min:91.1 F (32.8 C), Max:99 F (37.2 C)   Recent Labs Lab 04/12/16 1631 04/13/16 0024  04/14/16 0500 04/14/16 0924 04/14/16 2003 03/22/2016 0330 03/28/2016 1455 04/05/2016 1650 03/19/2016 1814 03/21/2016 2150 04/06/2016 0400 04/02/2016 1040  WBC  --  47.1*  < > 67.2*  --   --  75.2* QUESTIONABLE RESULTS, RECOMMEND RECOLLECT TO VERIFY 25.0*  --  27.7* 27.6* 23.4*  CREATININE  --  0.96  --  0.79  --   --  0.68 0.82  --   --   --  0.96  --   LATICACIDVEN  --   --   < >  --  1.4 1.4 1.1 7.6*  --  4.3*  --   --   --   VANCOTROUGH 25*  --   --   --   --   --   --   --   --   --   --   --   --   < > = values in this interval not displayed.  Estimated Creatinine Clearance: 45.9 mL/min (by C-G formula based on SCr of 0.96 mg/dL).     Antimicrobials this admission: Merrem 7/23 >>  Vancomycin 7/23 >>  Fluconazole 7/27 >>  Dose adjustments this admission: 7/23 Vanc 1250 mg load, then 500 mg IV q12h 7/26 VT = 25 (supertherapeutic) >> dec to  Vanc 750 mg IV q24h   Microbiology results: 7/23 Trach aspirate: rare budding yeast, MRSA 7/23 BCx: NGF 7/23 UCx: NGF 7/23 MRSA PCR: positive 7/28 BCx: NGTD 7/28 UCx: NGF  Thank you for allowing pharmacy to be a part of this patient's care.  Belia Heman, PharmD PGY1 Pharmacy Resident [phone] (Pager) 03/19/2016 2:53 PM

## 2016-04-16 NOTE — Progress Notes (Signed)
eLink Physician-Brief Progress Note Patient Name: Amy [name] DOB: July 25, 1941 MRN: [id number]   Date of Service  04/12/2016  HPI/Events of Note  Hypokalemia  eICU Interventions  Potassium replaced     Intervention Category Intermediate Interventions: Electrolyte abnormality - evaluation and management  DETERDING,ELIZABETH 03/25/2016, 5:36 AM

## 2016-04-16 NOTE — Progress Notes (Signed)
PCCM Attending Note: Patient's hemoglobin now 6.6, fibrinogen 235, & INR 1.91. Platelet count remains at 71,000. Transfusing 2 units packed red blood cells, 1 unit cryoprecipitate, and 2 units FFP now. Repeat CBC & coags posttransfusion.   Sonia Baller Ashok Cordia, M.D. [facility] Pulmonary & Critical Care Pager:  [phone] After 3pm or if no response, call [phone] 1:29 PM 03/30/2016

## 2016-04-16 NOTE — Transfer of Care (Signed)
Immediate Anesthesia Transfer of Care Note  Patient: Amy [name]  Procedure(s) Performed: Procedure(s): EXPLORATORY LAPAROTOMY (N/A) SMALL BOWEL RESECTION (N/A) APPLICATION OF WOUND VAC (N/A)  Patient Location: ICU  Anesthesia Type:General  Level of Consciousness: sedated, unresponsive and Patient remains intubated per anesthesia plan  Airway & Oxygen Therapy: Patient remains intubated per anesthesia plan and Patient placed on Ventilator (see vital sign flow sheet for setting)  Post-op Assessment: Report given to RN and Post -op Vital signs reviewed and stable  Post vital signs: Reviewed and stable  Last Vitals:  Vitals:   03/27/2016 0815 03/23/2016 0820  BP:    Pulse: 63   Resp: 14 14  Temp:      Last Pain:  Vitals:   04/01/2016 0723  TempSrc: Axillary  PainSc:       Patients Stated Pain Goal: 0 (65/68/12 7517)  Complications: No apparent anesthesia complications

## 2016-04-16 NOTE — Progress Notes (Signed)
Received call from [facility] regarding critical lab value platelets 38 and notified Dr. Tamala Julian eMD.

## 2016-04-16 NOTE — Plan of Care (Signed)
Problem: Cardiac: Goal: Ability to maintain an adequate cardiac output will improve Outcome: Progressing Vasopressors weaned down Goal: Will show no evidence of cardiac arrhythmias Outcome: Progressing Stable chronic afib

## 2016-04-16 NOTE — Progress Notes (Signed)
[facility] [place]  Subjective  - improving coags and stability overnight   Objective 107/69 90 97.6 F (36.4 C) (Axillary) (!) 21 100%  Intake/Output Summary (Last 24 hours) at 04/07/2016 0931 Last data filed at 03/21/2016 2751  Gross per 24 hour  Intake         15765.11 ml  Output             6450 ml  Net          9315.11 ml   On vasopressin  Abdomen viewed in OR with Dr Barry Dienes,  Two more short segments of small bowel reviewed.  Overall most of small bowel viable  SMA graft pulse palpable.  Still some retroperitoneal oozing  Assessment/Planning: S/p mesenteric bypass Would continue to aggressively correct coags/plts now that dead gut is out her DIC should begin to correct Continue broad spectrum antibiotics Back to OR with General surgery most likely Tuesday Leukocytosis overall improve Acidosis improving as well Hopefully wean off vasopressin over the next few days Start TPN asap for severe protein calorie malnutrition  Ruta Hinds 03/19/2016 9:31 AM --  Laboratory Lab Results:  Recent Labs  04/08/2016 2150 03/23/2016 0400  WBC 27.7* 27.6*  HGB 7.6* 7.3*  HCT 22.6* 22.2*  PLT 37* 73*   BMET  Recent Labs  03/21/2016 1455 03/21/2016 0400  NA 144 145  K 4.4 3.2*  CL 119* 118*  CO2 16* 24  GLUCOSE 142* 128*  BUN 20 21*  CREATININE 0.82 0.96  CALCIUM 9.3 8.1*    COAG Lab Results  Component Value Date   INR 1.89 03/19/2016   INR 2.17 03/28/2016   INR 5.40 (HH) 03/21/2016   No results found for: PTT

## 2016-04-16 NOTE — Anesthesia Preprocedure Evaluation (Signed)
Anesthesia Evaluation  Patient identified by MRN, date of birth, ID band Patient unresponsive    Reviewed: Allergy & Precautions, H&P , NPO status , Patient's Chart, lab work & pertinent test results, reviewed documented beta blocker date and time   Airway Mallampati: Intubated       Dental no notable dental hx. (+) Teeth Intact, Dental Advisory Given   Pulmonary sleep apnea , COPD,  COPD inhaler,  Lung CA   Pulmonary exam normal breath sounds clear to auscultation       Cardiovascular hypertension, Pt. on medications and Pt. on home beta blockers + Peripheral Vascular Disease   Rhythm:Regular Rate:Normal     Neuro/Psych CVA, Residual Symptoms negative psych ROS   GI/Hepatic Neg liver ROS, GERD  Medicated and Controlled,  Endo/Other  Hypothyroidism   Renal/GU negative Renal ROS  negative genitourinary   Musculoskeletal   Abdominal   Peds  Hematology negative hematology ROS (+) anemia ,   Anesthesia Other Findings   Reproductive/Obstetrics negative OB ROS                             Anesthesia Physical Anesthesia Plan  ASA: IV  Anesthesia Plan: General   Post-op Pain Management:    Induction: Intravenous  Airway Management Planned: Oral ETT  Additional Equipment:   Intra-op Plan:   Post-operative Plan: Post-operative intubation/ventilation  Informed Consent: I have reviewed the patients History and Physical, chart, labs and discussed the procedure including the risks, benefits and alternatives for the proposed anesthesia with the patient or authorized representative who has indicated his/her understanding and acceptance.   Dental advisory given  Plan Discussed with: CRNA  Anesthesia Plan Comments:         Anesthesia Quick Evaluation

## 2016-04-16 NOTE — Progress Notes (Signed)
PULMONARY / CRITICAL CARE MEDICINE   Name: Amy [name] MRN: [id number] DOB: [birth year]    ADMISSION DATE:  04/12/2016 CONSULTATION DATE:  7/23  REFERRING MD:  [facility] hospital per family request   CHIEF COMPLAINT:  Acute encephalopathy and progressive respiratory failure   HISTORY OF PRESENT ILLNESS:   This is a 75 year old white female who was initially admitted to [facility] hospital where she underwent an elective loop colostomy takedown on 7/18. Her immediate post-op course was c/b lethargy and actually required narcan in PACU. She never really improved w/ hospital notes continuing to raise concern for altered mental status and difficulty to arouse. She ultimately required emergent intubation for hypercarbia (CO2 52) and inability to protect airway. She was transferred per family request on 7/23 for further evaluation and care. MRI confirmed new CVA. Suspect this was multifactorial: CVA, meds, +/- pneumonia. She was extubated on 7/24 and transferred to the SDU. On 7/28, she developed worsening abdominal pain and leukocytosis. 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 On 7/29, she underwent aorto to superior mesenteric artery bypass with greater saphenous vein graft and small bowel resection. In the immediate post-op period, she became hypotensive to the 50s and was started on pressors. She was transferred back to [facility] service.  SUBJECTIVE: Patient taken back to OR today. She was found to have oozing from peritoneal surfaces with intact graft. Further bowel was resected for ischemia/necrosis.  REVIEW OF SYSTEMS:  Unable to obtain with patient intubated & sedated.  VITAL SIGNS: BP (!) 130/91   Pulse 93   Temp (!) 94.1 F (34.5 C) (Oral)   Resp 14   Ht '5\' 6"'$  (1.676 m)   Wt 156 lb 4.9 oz (70.9 kg)   SpO2 100%   BMI 25.23 kg/m   HEMODYNAMICS:    VENTILATOR SETTINGS: Vent Mode: Stand-by FiO2 (%):  [40 %-100 %] 40 % Set Rate:  [14 bmp-26  bmp] 14 bmp Vt Set:  [470 mL] 470 mL PEEP:  [5 cmH20] 5 cmH20 Plateau Pressure:  [15 cmH20-21 cmH20] 21 cmH20  INTAKE / OUTPUT: I/O last 3 completed shifts: In: 16403.1 [I.V.:6770.3; Blood:7462.8; NG/GT:120; IV HQIONGEXB:2841] Out: 3244 [Urine:2225; Emesis/NG output:1400; Drains:3950; Other:400; Blood:800]  PHYSICAL EXAMINATION: General: Sedated. No acute distress. No family at bedside.  Integument:  Warm & dry. No rash on exposed skin. Abdominal wound VAC in place. HEENT:  Endotracheal tube in place. Moist mucous membranes. Cardiovascular:  Regular rate. Normal S1 & S2. No appreciable JVD.  Pulmonary:  Distant breath sounds bilaterally. Symmetric chest wall rise on ventilator. Abdomen: Soft. Hypoactive bowel sounds. Nondistended.  Neurological: Sedated. Pupils symmetric.   LABS:  BMET  Recent Labs Lab 03/18/2016 0330  04/03/2016 1210 03/30/2016 1455 04/01/2016 0400  NA 145  < > 147* 144 145  K 3.1*  < > 4.3 4.4 3.2*  CL 117*  --   --  119* 118*  CO2 25  --   --  16* 24  BUN 22*  --   --  20 21*  CREATININE 0.68  --   --  0.82 0.96  GLUCOSE 114*  --   --  142* 128*  < > = values in this interval not displayed.  Electrolytes  Recent Labs Lab 04/11/16 0410 04/12/16 0355  04/17/2016 0330 04/05/2016 1455 04/08/2016 0400  CALCIUM 7.9* 8.1*  < > 7.9* 9.3 8.1*  MG 2.8* 2.6*  < > 2.1 1.7 1.8  PHOS 3.3 3.0  --   --   --  2.9  < > = values in this interval not displayed.  CBC  Recent Labs Lab 04/08/2016 1650 03/20/2016 2150 03/25/2016 0400  WBC 25.0* 27.7* 27.6*  HGB 8.4* 7.6* 7.3*  HCT 25.5* 22.6* 22.2*  PLT 65* 37* 73*    Coag's  Recent Labs Lab 04/06/2016 1455 03/26/2016 2150 03/26/2016 0400 03/27/2016 1040  APTT 83* 37* 35  --   INR 5.40* 2.17 1.89 1.75    Sepsis Markers  Recent Labs Lab 03/22/2016 1515  04/10/16 0345 04/11/16 0410  03/28/2016 0330 03/29/2016 1455 04/10/2016 1814  LATICACIDVEN 2.3*  < >  --   --   < > 1.1 7.6* 4.3*  PROCALCITON 0.95  --  0.75 0.65  --    --   --   --   < > = values in this interval not displayed.  ABG  Recent Labs Lab 04/14/2016 1504 03/23/2016 2210 03/26/2016 0410  PHART 7.484* 7.516* 7.366  PCO2ART 25.4* 23.8* 40.4  PO2ART 463.0* 125.0* 110.0*    Liver Enzymes  Recent Labs Lab 04/12/2016 0330 03/18/2016 1455 03/19/2016 0400  AST 187* 103* 676*  ALT 163* 54 187*  ALKPHOS 95 29* 65  BILITOT 1.6* 1.3* 2.9*  ALBUMIN 1.9* 1.2* 2.5*    Cardiac Enzymes  Recent Labs Lab 04/03/2016 1515 04/08/2016 2140 04/10/16 0430  TROPONINI 1.95* 0.63* 0.64*    Glucose  Recent Labs Lab 04/04/2016 1714 03/26/2016 2159 03/29/2016 2356 04/17/2016 0333 03/19/2016 0336 04/13/2016 1130  GLUCAP 177* 88 109* 50* 137* 130*    Imaging Dg Chest Port 1 View  Result Date: 03/20/2016 CLINICAL DATA:  Central line placement EXAM: PORTABLE CHEST 1 VIEW COMPARISON:  03/26/2016 FINDINGS: Right central line, endotracheal tube and NG tube remain in place, unchanged. Stable right apical pleural/parenchymal thickening and postoperative changes. Bibasilar opacities, left greater than right. Suspect small bilateral effusions. Findings similar to prior study. Heart is upper limits normal in size. IMPRESSION: Bibasilar atelectasis or infiltrates with layering effusions. No real change. Electronically Signed   By: Rolm Baptise M.D.   On: 03/18/2016 07:27  Dg Chest Port 1 View  Result Date: 03/19/2016 CLINICAL DATA:  Central line placement EXAM: PORTABLE CHEST 1 VIEW COMPARISON:  04/13/2016 FINDINGS: Right central line remains in place with the tip in the SVC. Endotracheal tube is now placed with the ti no acute bony abnormality. P 3 cm above the carina. NG tube has been placed into the stomach. Heart is normal size. Layering left pleural effusion. Left lower lobe atelectasis. Stable right lower lobe pleural thickening with surgical clips in the region. No acute bony abnormality. IMPRESSION: Layering left pleural effusion with left lower lobe atelectasis. Endotracheal  tube 3 cm above the carina. NG tube enters the stomach. Right central line is unchanged. Electronically Signed   By: Rolm Baptise M.D.   On: 04/13/2016 15:13    STUDIES:  CT head (at [facility]): negative  MRI brain  7/23: Bilateral cerebral hemispheres and tight cerebellar hemisphere patchy acute and early subacute infarcts. No mass effect or hemorrhage. Central thromboembolic source is suspected.  EEG 7/23: Abnormal due to moderate diffuse slowing of the waking background. No seizure or epiletiform discharges.  TTE 7/24: EF 50-55%, lateral wall appears hypokinetic, systolic function normal, mild MR regurg Carotid US 7/24: right mild soft plaque CCA, mild to moderate plaque origin and proximal ICA and ECA 1-39% ICA stenosis.  Port CXR 7/30: ETT in good position. Enteric feeding tube coursing below diaphragm. Right internal jugular central venous catheter  in good position. Bilateral lower lung opacity versus effusion unchanged.   MICROBIOLOGY: MRSA PCR 7/23:  Positive  Urine Ctx 7/23:  Negative  Blood Ctx x2 7/23:  Negative  Tracheal Asp Ctx 7/23:  MRSA Blood Ctx x2 7/28>>> Urine Ctx 7/28:  Negative  ANTIBIOTICS: Vancomycin 7/23 >> Meropenem 7/23 >> Diflucan 7/27 >>   SIGNIFICANT EVENTS: Loop colostomy takedown 7/18 7/18-7/22: progressively lethargic  7/23 [facility] to [facility] s/p getting intubated for AMS and hypercarbia. Surgical service consulted at [facility] on arrival  7/24 on CCB gtt for af. But fully awake and following commands. Extubated. 7/25 SLP evaluation for diet, ASA started, FEES 7/28 developed worsening abdominal pain, leukocytosis, CT showing complete SMA occlusion 7/29 Underwent aorto to SMA bypass with greater saphenous vein graft and small bowel resection 7/29 Developed hypotension to the 50s in post-op period, started on pressors 7/29 Code status changed to DNR 7/30 Ex Lap by CCS w/ necrotic bowel & ischemia s/p resection w/ intact bypass & oozing over peritoneal  surfaces.  LINES/TUBES: OETT 7/23>>>7/24; 7/29 >> R IJ CVL 7/19 >> L Radial Art Line 7/29 >>  FOLEY 7/29 >> R NGT 7/29 >> IABP 7/29 >>  ASSESSMENT / PLAN:  PULMONARY A: Acute Hypercarbic Respiratory Failure Acute Hypoxic Respiratory Failure MRSA Pneumonia H/O COPD H/O OSA (intolerant of CPAP) H/O RUL Lobectomy - For NSCLC.  P:   Full vent support Pulmicort Neb bid Change Duoneb to [employer] on SBT & WUA depending on surgical plans  CARDIOVASCULAR A:  Shock - Septic vs Hypovolemic. Atrial Fibrillation Elevated Troponin I - Likely demand ischemia. 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 Bowel Ischemia - S/P resection 7/29 & further resection 7/30. Shock Liver H/O GERD  P:   NPO Consulting for TPN Pepcid IV q12hr Further Management per CCS Trending LFTs daily  RENAL A:   Hypokalemia - Replaced. [place] - Resolving. Likely due to hyperchloremia. Hypophosphatemia - Resolved. Hypomagnesemia - Resolved. Hyperchloremia - Stable.  P:   Trending UOP with Foley Monitoring electrolytes & renal function daily Replacing electrolytes as indicated Switching to D5 1/2 NS KCl 38mq IV x6  HEMATOLOGIC A:   DIC/Coagulopathy - S/P Massive Transfusion. S/P 8u FFP 7/30 & 8u FFP 7/29. Anemia - Secondary to acute blood loss. S/P 4u PRBC 7/30 & 8u PRBC 7/29. Leukocytosis - Secondary to Sepsis. Thrombocytopenia - Secondary to consumption w/ DIC. S/P 1u Platelets this AM. 2u Platelets 7/29. Embolic CVA  P:  Trending Cell Counts daily w/ CBC Repeat Hgb/Hct, INR, PTT, & Fibrinogen now Trending Coags daily Transfuse for Platelets <50,000, Hgb <8.0, INR >1.7, or Fibrinogen <100 or for active bleeding  INFECTIOUS A:    Sepsis MRSA Pneumonia  P:   Continuing Empiric Diflucan, Merrem, & Vancocin Plan to re-culture for fever  ENDOCRINE A:   H/O Hypothyroidism (TSH 28.2) Hypoglycemia - Resolved w/ D5 IVF.  P:   Switch to Levothyroxine 429m IV daily Continuing D5 1/2NS @ 50cc/hr Continuing Accu-Checks q4hr SSI per Sensitive Algorithm  NEUROLOGIC A:   Sedation on Ventilator Post-operative Pain 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   P:   Neurology following for stroke Goal RASS: -2 Fentanyl gtt & IV prn Pain Versed IV prn Sedation  FAMILY UPDATE: No family at bedside 7/30.    TODAY'S SUMMARY:  75 year old white female who was initially admitted to [facility] hospital where she underwent an elective loop colostomy takedown on 7/18. Her immediate post-op course was c/b lethargy and actually required narcan in PACU. She never really improved w/ hospital notes continuing to raise concern for altered mental status and difficulty to arouse. MRI confirmed new CVA. Suspect this was multifactorial: CVA, meds, +/- pneumonia. She was extubated on 7/24 and transferred to the SDU. On 7/28, she developed worsening abdominal pain and leukocytosis. 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 On 7/29, she underwent aorto to superior mesenteric artery bypass with greater saphenous vein graft and small bowel resection. In the immediate post-op period, she became hypotensive to the 50s and was started on pressors. patient status post resection of necrotic bowel today. Continuing to require decreasing doses of vasopressor support. Continuing to monitor coagulopathy closely with judicious use of transfusions.   I have spent a total of 41 minutes of critical care time today caring for the patient and reviewing the patient's electronic medical record.  Sonia Baller Ashok Cordia, M.D. [facility] Pager:  [phone] After 3pm or if  no response, call [phone] 11:59 AM 04/11/2016

## 2016-04-16 NOTE — Progress Notes (Signed)
PARENTERAL NUTRITION CONSULT NOTE - FOLLOW UP  Pharmacy Consult for TPN Indication: massive bowel resection  Allergies  Allergen Reactions  . Ativan [Lorazepam] Other (See Comments)    Confusion and agitation   . Ace Inhibitors Cough  . Codeine Nausea Only  . Morphine And Related     Went crazy  . Penicillins Itching    No other information available at this time 03/29/2016  . Prednisone Other (See Comments)    Head aches   . Spiriva Handihaler [Tiotropium Bromide Monohydrate] Other (See Comments)    Urinary retention     Patient Measurements: Height: '5\' 6"'$  (167.6 cm) Weight: 156 lb 4.9 oz (70.9 kg) IBW/kg (Calculated) : 59.3 Adjusted Body Weight: 63.9 kg   Vital Signs: Temp: 97.6 F (36.4 C) (07/30 0723) Temp Source: Axillary (07/30 0723) BP: 107/69 (07/30 0700) Pulse Rate: 63 (07/30 0815) Intake/Output from previous day: 07/29 0701 - 07/30 0700 In: 14753.1 [I.V.:6020.3; Blood:7462.8; NG/GT:120; IV Piggyback:1150] Out: 4854 [Urine:800; Emesis/NG output:1400; Drains:3950; Blood:800] Intake/Output from this shift: Total I/O In: 1661.3 [I.V.:21.3; Blood:1640] Out: 150 [Emesis/NG output:50; Blood:100]  Labs:  Recent Labs  04/03/2016 1455 04/01/2016 1650 03/23/2016 2150 04/03/2016 0400  WBC QUESTIONABLE RESULTS, RECOMMEND RECOLLECT TO VERIFY 25.0* 27.7* 27.6*  HGB 3.3* 8.4* 7.6* 7.3*  HCT 10.5* 25.5* 22.6* 22.2*  PLT QUESTIONABLE RESULTS, RECOMMEND RECOLLECT TO VERIFY 65* 37* 73*  APTT 83*  --  37* 35  INR 5.40*  --  2.17 1.89     Recent Labs  04/14/2016 0330  04/01/2016 1210 03/29/2016 1455 04/08/2016 0400  NA 145  < > 147* 144 145  K 3.1*  < > 4.3 4.4 3.2*  CL 117*  --   --  119* 118*  CO2 25  --   --  16* 24  GLUCOSE 114*  --   --  142* 128*  BUN 22*  --   --  20 21*  CREATININE 0.68  --   --  0.82 0.96  CALCIUM 7.9*  --   --  9.3 8.1*  MG 2.1  --   --  1.7 1.8  PHOS  --   --   --   --  2.9  PROT 4.1*  --   --  <3.0* 4.4*  ALBUMIN 1.9*  --   --  1.2* 2.5*   AST 187*  --   --  103* 676*  ALT 163*  --   --  54 187*  ALKPHOS 95  --   --  29* 65  BILITOT 1.6*  --   --  1.3* 2.9*  PREALBUMIN  --   --   --   --  15.0*  TRIG  --   --   --   --  84  < > = values in this interval not displayed. Estimated Creatinine Clearance: 45.9 mL/min (by C-G formula based on SCr of 0.96 mg/dL).    Recent Labs  04/05/2016 2356 03/31/2016 0333 04/14/2016 0336  GLUCAP 109* 50* 137*    Medications:  Prescriptions Prior to Admission  Medication Sig Dispense Refill Last Dose  . albuterol (PROVENTIL HFA;VENTOLIN HFA) 108 (90 Base) MCG/ACT inhaler Inhale 2 puffs into the lungs every 6 (six) hours as needed for wheezing or shortness of breath.   unknown  . albuterol (PROVENTIL) (2.5 MG/3ML) 0.083% nebulizer solution Take 2.5 mg by nebulization 2 (two) times daily.   04/04/2016 at am  . aspirin EC 81 MG tablet Take 81 mg by mouth daily.  04/04/2016  . cholecalciferol (VITAMIN D) 1000 units tablet Take 1,000 Units by mouth daily after breakfast.   04/04/2016  . furosemide (LASIX) 20 MG tablet Take 20 mg by mouth daily as needed (swelling).    week ago  . levothyroxine (SYNTHROID, LEVOTHROID) 50 MCG tablet Take 50 mcg by mouth daily after breakfast.   04/04/2016  . metoprolol succinate (TOPROL-XL) 25 MG 24 hr tablet Take 25 mg by mouth daily.   04/04/2016 at 700  . OXYGEN Inhale 2 L into the lungs at bedtime.   04/03/2016  . Probiotic Product (PROBIOTIC PO) Take 1 tablet by mouth daily after breakfast.   04/04/2016  . ranitidine (ZANTAC) 150 MG tablet Take 150 mg by mouth daily after breakfast.   04/04/2016    Insulin Requirements in the past 24 hours:  4 units SSI  Current Nutrition:  NPO  Assessment: 75 yo F w/ bowel ischemia, SMA thrombus, s/p OR 7/29 w/ mesenteric artery bypass and SBR, OR 7/30 2 more short segments of small bowel removed- per CCS note overall most of small bowel viable, SMA graft pulse palpable. Plan back to OR likely Tues.  To start TPN for severe  protein calorie malnutrition.  Critically ill on fent, phenyephrine and vasopressin drips and getting FFP, PRBCs  Nutritional Goals: prealb 15 - could be low due to inflammation/critical illness f/u w/ RD for goals  kCal,  grams of protein per day  Lytes: k 3.2, 6 runs ordered; phos 2.9, mag 1.8, was 1.7 last night  (has standing order for mag 2 gm for < 1.7)    Hepatic:  Alb 2.5, ast 676, alt 187, t bili elevated at  2.9; Trig 84  Access:  Has triple lumen CL but RN using all ports currently; pt getting K runs and multiple blood products- d/w Dr Oneida Alar, he will consult CCM for more IV access- to get triple PICC and remove CVL   Plan: start Clinimix E 5/15 at 40 ml/hr and f/u tolerance This will provide ~ 50 gm protein and 710 kcals  Watch T bili, full trace elements for now Withhold lipids during first week of TPN per ASPEN guideline for critically ill patients F/u with unit RD for goals - will likely be unable to meet goals w/ no lipids 6 runs of K per CCM,  Mag 2 gm bolus Could add IV pepcid to TPN for convenience - will start on Monday Continue SSI TPN labs ordered  Eudelia Bunch, Pharm.D. [phone] 03/23/2016 1:40 PM

## 2016-04-16 NOTE — Progress Notes (Signed)
Peripherally Inserted Central Catheter/Midline Placement  The IV Nurse has discussed with the patient and/or persons authorized to consent for the patient, the purpose of this procedure and the potential benefits and risks involved with this procedure.  The benefits include less needle sticks, lab draws from the catheter, ability to perform PICC exchange if ordered by the doctor and patient may be discharged home with the catheter.  Risks include, but not limited to, infection, bleeding, blood clot (thrombus formation), and puncture of an artery; nerve damage and irregular heart beat.  Alternatives to this procedure were also discussed.  Husband signed consent.  Bard educational packet left at bedside.  PICC/Midline Placement Documentation  PICC Triple Lumen 83/66/29 PICC Left Basilic 42 cm 0 cm (Active)  Indication for Insertion or Continuance of Line Vasoactive infusions;Prolonged intravenous therapies;Limited venous access - need for IV therapy >5 days (PICC only);Poor Vasculature-patient has had multiple peripheral attempts or PIVs lasting less than 24 hours 04/13/2016  6:08 PM  Exposed Catheter (cm) 0 cm 03/19/2016  6:08 PM  Site Assessment Clean;Dry;Intact 03/27/2016  6:08 PM  Lumen #1 Status Flushed;Saline locked;Blood return noted 04/06/2016  6:08 PM  Lumen #2 Status Flushed;Saline locked;Blood return noted 04/11/2016  6:08 PM  Lumen #3 Status Flushed;Saline locked;Blood return noted 04/08/2016  6:08 PM  Dressing Type Transparent 03/29/2016  6:08 PM  Dressing Status Clean;Dry;Intact;Antimicrobial disc in place 03/19/2016  6:08 PM  Line Care Connections checked and tightened 04/11/2016  6:08 PM  Line Adjustment (NICU/IV Team Only) No 03/22/2016  6:08 PM  Dressing Intervention New dressing 04/11/2016  6:08 PM  Dressing Change Due 04/23/16 03/21/2016  6:08 PM       Rolena Infante 03/28/2016, 6:10 PM

## 2016-04-16 NOTE — Progress Notes (Signed)
Due to new PICC line and IV gtts changed to new line, 259m of a Fentanyl bag wasted and rinsed down sink, witnessed by second RN, ALorrin Jackson

## 2016-04-16 NOTE — Anesthesia Postprocedure Evaluation (Signed)
Anesthesia Post Note  Patient: Amy [name]  Procedure(s) Performed: Procedure(s) (LRB): EXPLORATORY LAPAROTOMY (N/A) SMALL BOWEL RESECTION (N/A) APPLICATION OF WOUND VAC (N/A)  Patient location during evaluation: ICU Anesthesia Type: General Level of consciousness: obtunded/minimal responses and patient remains intubated per anesthesia plan Vital Signs Assessment: post-procedure vital signs reviewed and stable Respiratory status: patient on ventilator - see flowsheet for VS and patient remains intubated per anesthesia plan Cardiovascular status: stable Anesthetic complications: no    Last Vitals:  Vitals:   03/30/2016 0815 03/28/2016 0820  BP:    Pulse: 63   Resp: 14 14  Temp:      Last Pain:  Vitals:   04/08/2016 0723  TempSrc: Axillary  PainSc:                  Marinda Elk

## 2016-04-16 NOTE — Progress Notes (Signed)
eLink Physician-Brief Progress Note Patient Name: Amy [name] DOB: Jul 02, 1941 MRN: [id number]   Date of Service  03/18/2016  HPI/Events of Note  Low platelets secondary to consumption from bleeding.  38K  eICU Interventions  Transfusion ordered     Intervention Category Intermediate Interventions: Thrombocytopenia - evaluation and management  Mauri Brooklyn, P 03/30/2016, 5:43 PM

## 2016-04-16 NOTE — Op Note (Signed)
PRE-OPERATIVE DIAGNOSIS: bowel ischemia and open abdomen  POST-OPERATIVE DIAGNOSIS:  Same  PROCEDURE:  Procedure(s): Exploratory laparotomy, small bowel resection x 2, placement of abdominal wound vac  SURGEON:  Surgeon(s): Stark Klein, MD  ANESTHESIA:   general  DRAINS: abdominal wound vac   LOCAL MEDICATIONS USED:  NONE  SPECIMEN:  Source of Specimen:  additional ileum and additional jejunum  DISPOSITION OF SPECIMEN:  PATHOLOGY  COUNTS:  YES  DICTATION: .Dragon Dictation  PLAN OF CARE: Admit to inpatient   PATIENT DISPOSITION:  ICU - intubated and critically ill.  FINDINGS:   1.  Additional full thickness ischemia in several places in the distal ileum.  2.  Additional necrosis in the proximal portion of the jejunal portion.   3.  BOWEL STILL IN DISCONTINUITY.  One segment centrally that is not hooked proximally to jejunum or distally to ileum.  Still around 20-30 cm from ileocecal valve with most distal resection.   4.  Several areas of ischemia that appears to have potential to recover in central bowel portion. 5.  Still very oozy over peritoneal surfaces 6.  SMA bypass graft intact with good pulse.   7.  Colonic anastamosis with slight duskiness.  No evidence of leak.  Appears intact.    EBL: 300 mL, some clot, some fresh blood.    PROCEDURE: Patient was identified in the ICU and taken straight back to OR 9 intubated.  A timeout was performed according to the surgical safety checklist.  When all was correct, we continued.    The superficial portion of the abdominal vac was removed.  The abdomen was prepped with betadine.  The abdomen was prepped and draped sterilely.  The inner portion of the vac was then removed.  The abdomen was gently explored.  Clot and some blood in the pelvis was found.  There was some additional ischemic areas in the bowel.    The distal portion of the central portion of small bowel was removal with the GIA 75 mm stapler.  This was approximately  20 cm.  The proximal 10 cm of the central portion of small bowel was stapled off.  The Ligasure was used to divide the mesentery.  Care was taken to stay high near the bowel in order to avoid compromising blood flow to the remaining bowel.    The abdomen was irrigated. No overt signs of bleeding were seen.   A new abdominal vac pac was placed.  Needle, sponge, and instrument counts were correct times 2.  The patient was then taken back to the ICU intubated.

## 2016-04-17 ENCOUNTER — Encounter (HOSPITAL_COMMUNITY): Payer: Self-pay | Admitting: General Surgery

## 2016-04-17 DIAGNOSIS — R531 Weakness: Secondary | ICD-10-CM

## 2016-04-17 LAB — COMPREHENSIVE METABOLIC PANEL
ALBUMIN: 2.1 g/dL — AB (ref 3.5–5.0)
ALT: 108 U/L — ABNORMAL HIGH (ref 14–54)
ANION GAP: 3 — AB (ref 5–15)
AST: 239 U/L — ABNORMAL HIGH (ref 15–41)
Alkaline Phosphatase: 71 U/L (ref 38–126)
BILIRUBIN TOTAL: 2.9 mg/dL — AB (ref 0.3–1.2)
BUN: 22 mg/dL — ABNORMAL HIGH (ref 6–20)
CHLORIDE: 115 mmol/L — AB (ref 101–111)
CO2: 26 mmol/L (ref 22–32)
Calcium: 7.5 mg/dL — ABNORMAL LOW (ref 8.9–10.3)
Creatinine, Ser: 0.84 mg/dL (ref 0.44–1.00)
GFR calc Af Amer: >60 mL/min (ref >60)
Glucose, Bld: 122 mg/dL — ABNORMAL HIGH (ref 65–99)
POTASSIUM: 3.7 mmol/L (ref 3.5–5.1)
Sodium: 144 mmol/L (ref 135–145)
TOTAL PROTEIN: 4.1 g/dL — AB (ref 6.5–8.1)

## 2016-04-17 LAB — PREPARE FRESH FROZEN PLASMA
UNIT DIVISION: 0
UNIT DIVISION: 0
UNIT DIVISION: 0
Unit division: 0
Unit division: 0
Unit division: 0

## 2016-04-17 LAB — CBC WITH DIFFERENTIAL/PLATELET
BASOS ABS: 0 10*3/uL (ref 0.0–0.1)
BASOS ABS: 0 10*3/uL (ref 0.0–0.1)
BASOS PCT: 0 %
BASOS PCT: 0 %
Basophils Absolute: 0 10*3/uL (ref 0.0–0.1)
Basophils Relative: 0 %
EOS ABS: 0.3 10*3/uL (ref 0.0–0.7)
EOS ABS: 1.5 10*3/uL — AB (ref 0.0–0.7)
EOS PCT: 4 %
Eosinophils Absolute: 0.8 10*3/uL — ABNORMAL HIGH (ref 0.0–0.7)
Eosinophils Relative: 1 %
Eosinophils Relative: 2 %
HCT: 23 % — ABNORMAL LOW (ref 36.0–46.0)
HEMATOCRIT: 27.5 % — AB (ref 36.0–46.0)
HEMATOCRIT: 29.1 % — AB (ref 36.0–46.0)
HEMOGLOBIN: 9.2 g/dL — AB (ref 12.0–15.0)
Hemoglobin: 7.8 g/dL — ABNORMAL LOW (ref 12.0–15.0)
Hemoglobin: 9.7 g/dL — ABNORMAL LOW (ref 12.0–15.0)
LYMPHS ABS: 0.6 10*3/uL — AB (ref 0.7–4.0)
LYMPHS PCT: 1 %
Lymphocytes Relative: 2 %
Lymphocytes Relative: 2 %
Lymphs Abs: 0.5 10*3/uL — ABNORMAL LOW (ref 0.7–4.0)
Lymphs Abs: 0.4 10*3/uL — ABNORMAL LOW (ref 0.7–4.0)
MCH: 29.9 pg (ref 26.0–34.0)
MCH: 29.8 pg (ref 26.0–34.0)
MCH: 30 pg (ref 26.0–34.0)
MCHC: 33.9 g/dL (ref 30.0–36.0)
MCHC: 33.5 g/dL (ref 30.0–36.0)
MCHC: 33.3 g/dL (ref 30.0–36.0)
MCV: 88.1 fL (ref 78.0–100.0)
MCV: 89 fL (ref 78.0–100.0)
MCV: 90.1 fL (ref 78.0–100.0)
MONO ABS: 0.6 10*3/uL (ref 0.1–1.0)
Monocytes Absolute: 1.1 10*3/uL — ABNORMAL HIGH (ref 0.1–1.0)
Monocytes Absolute: 1.5 10*3/uL — ABNORMAL HIGH (ref 0.1–1.0)
Monocytes Relative: 2 %
Monocytes Relative: 3 %
Monocytes Relative: 4 %
NEUTROS ABS: 31.3 10*3/uL — AB (ref 1.7–7.7)
NEUTROS PCT: 95 %
NEUTROS PCT: 93 %
NEUTROS PCT: 91 %
Neutro Abs: 30 10*3/uL — ABNORMAL HIGH (ref 1.7–7.7)
Neutro Abs: 33.6 10*3/uL — ABNORMAL HIGH (ref 1.7–7.7)
PLATELETS: 92 10*3/uL — AB (ref 150–400)
PLATELETS: 63 10*3/uL — AB (ref 150–400)
Platelets: 66 10*3/uL — ABNORMAL LOW (ref 150–400)
RBC: 2.61 MIL/uL — AB (ref 3.87–5.11)
RBC: 3.09 MIL/uL — AB (ref 3.87–5.11)
RBC: 3.23 MIL/uL — AB (ref 3.87–5.11)
RDW: 15.7 % — AB (ref 11.5–15.5)
RDW: 16 % — ABNORMAL HIGH (ref 11.5–15.5)
RDW: 16.4 % — ABNORMAL HIGH (ref 11.5–15.5)
WBC: 31.5 10*3/uL — AB (ref 4.0–10.5)
WBC: 33.8 10*3/uL — AB (ref 4.0–10.5)
WBC: 37 10*3/uL — ABNORMAL HIGH (ref 4.0–10.5)

## 2016-04-17 LAB — PREPARE PLATELET PHERESIS
UNIT DIVISION: 0
UNIT DIVISION: 0

## 2016-04-17 LAB — PREPARE CRYOPRECIPITATE: UNIT DIVISION: 0

## 2016-04-17 LAB — GLUCOSE, CAPILLARY
GLUCOSE-CAPILLARY: 26 mg/dL — AB (ref 65–99)
GLUCOSE-CAPILLARY: 79 mg/dL (ref 65–99)
GLUCOSE-CAPILLARY: 100 mg/dL — AB (ref 65–99)
GLUCOSE-CAPILLARY: 102 mg/dL — AB (ref 65–99)
Glucose-Capillary: 106 mg/dL — ABNORMAL HIGH (ref 65–99)
Glucose-Capillary: 119 mg/dL — ABNORMAL HIGH (ref 65–99)
Glucose-Capillary: 121 mg/dL — ABNORMAL HIGH (ref 65–99)
Glucose-Capillary: 137 mg/dL — ABNORMAL HIGH (ref 65–99)
Glucose-Capillary: 82 mg/dL (ref 65–99)

## 2016-04-17 LAB — TRIGLYCERIDES: TRIGLYCERIDES: 57 mg/dL (ref <150)

## 2016-04-17 LAB — APTT: aPTT: 34 seconds (ref 24–36)

## 2016-04-17 LAB — VANCOMYCIN, TROUGH: Vancomycin Tr: 21 ug/mL (ref 15–20)

## 2016-04-17 LAB — PROTIME-INR
INR: 1.71
INR: 1.36
PROTHROMBIN TIME: 20.2 s — AB (ref 11.4–15.2)
Prothrombin Time: 16.9 seconds — ABNORMAL HIGH (ref 11.4–15.2)

## 2016-04-17 LAB — LACTIC ACID, PLASMA: LACTIC ACID, VENOUS: 1.3 mmol/L (ref 0.5–1.9)

## 2016-04-17 LAB — MAGNESIUM: MAGNESIUM: 1.9 mg/dL (ref 1.7–2.4)

## 2016-04-17 LAB — PHOSPHORUS: PHOSPHORUS: 2.9 mg/dL (ref 2.5–4.6)

## 2016-04-17 LAB — PREPARE RBC (CROSSMATCH)

## 2016-04-17 LAB — FIBRINOGEN: FIBRINOGEN: 302 mg/dL (ref 210–475)

## 2016-04-17 MED ORDER — SODIUM CHLORIDE 0.9 % IV SOLN
Freq: Once | INTRAVENOUS | Status: AC
  Administered 2016-04-17: 06:00:00 via INTRAVENOUS

## 2016-04-17 MED ORDER — TRACE MINERALS CR-CU-MN-SE-ZN 10-1000-500-60 MCG/ML IV SOLN
INTRAVENOUS | Status: AC
  Administered 2016-04-17: 17:00:00 via INTRAVENOUS
  Filled 2016-04-17: qty 1440

## 2016-04-17 MED ORDER — VANCOMYCIN HCL 500 MG IV SOLR
500.0000 mg | INTRAVENOUS | Status: AC
  Administered 2016-04-17 – 2016-04-18 (×2): 500 mg via INTRAVENOUS
  Filled 2016-04-17 (×2): qty 500

## 2016-04-17 MED ORDER — DEXTROSE-NACL 5-0.45 % IV SOLN: INTRAVENOUS | Status: DC

## 2016-04-17 MED ORDER — POTASSIUM CHLORIDE 10 MEQ/50ML IV SOLN
10.0000 meq | INTRAVENOUS | Status: AC
  Administered 2016-04-17 (×3): 10 meq via INTRAVENOUS
  Filled 2016-04-17: qty 50

## 2016-04-17 NOTE — Progress Notes (Signed)
Initial Nutrition Assessment  DOCUMENTATION CODES:   Not applicable  INTERVENTION:    TPN per pharmacy  NUTRITION DIAGNOSIS:   Inadequate oral intake related to inability to eat as evidenced by NPO status  GOAL:   Patient will meet greater than or equal to 90% of their needs  MONITOR:   Vent status, Labs, Weight trends, Skin, I & O's  REASON FOR ASSESSMENT:   Consult New TPN/TNA  ASSESSMENT:   75 y.o. Female who presented for evaluation of abdominal pain x 1 day. 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 Her KUB was suggestive of an ileus. She is s/p elective colostomy takedown at [facility] on 04/04/16. Her post-op course was complicated altered mental status, lethargy and respiratory distress requiring emergent intubation. She was transferred to [facility] on 04/12/2016 per family's request.    Patient s/p procedure 7/30: EXPLORATORY LAPAROTOMY SMALL BOWEL RESECTION x 2 PLACEMENT OF ABDOMINAL WOUND VAC  Patient is currently intubated on ventilator support >> NGT to LIS MV: 6.6 L/min Temp (24hrs), Avg:96.8 F (36 C), Min:94.3 F (34.6 C), Max:97.8 F (36.6 C)  Pt transferred from 3S-Stepdown to 2S-SICU post-op Prior to surgery, pt was on a Dys 2-nectar thick liquid diet. Possibly back to OR tomorrow for reanastomosis and closure of abdomen. Unable to complete Nutrition-Focused physical exam at this time.   Patient is receiving TPN via PICC line with Clinimix E 5/15 @ 60 ml/hr.  No lipids at this time.  Provides 1002 kcal and 72 grams protein per day. Meets 79% minimum estimated energy needs and 68% minimum estimated protein needs.  Diet Order:  Diet NPO time specified .TPN (CLINIMIX-E) Adult .TPN (CLINIMIX-E) Adult  Skin:   NPWT to abdomen  Last BM:  7/27  Height:   Ht Readings from Last 1 Encounters:  04/05/2016 '5\' 6"'$  (1.676 m)    Weight:   Wt Readings from Last 1 Encounters:  04/17/16 156 lb 15.5 oz (71.2 kg)     Ideal Body Weight:  59 kg  BMI:  Body mass index is 25.34 kg/m.  Estimated Nutritional Needs:   Kcal:  1271  Protein:  105-115 gm  Fluid:  per MD  EDUCATION NEEDS:   No education needs identified at this time  Arthur Holms, RD, LDN Pager #: [phone] After-Hours Pager #: [phone]

## 2016-04-17 NOTE — Progress Notes (Addendum)
Progress Note    04/17/2016 7:29 AM 1 Day Post-Op  Subjective:  intubated  Vitals: Afebrile HR  60's-110's Afib 96'V-893'Y systolic 101% .75ZWC5   Gtts: Fentanyl gtt Vasopressin TPN @ 40cc/hr  IVF:  50cc/hr  ABx: Vancomycin Meropenem Flucanozole   Vitals:   04/17/16 0710 04/17/16 0725  BP:    Pulse: 97 63  Resp: 13 14  Temp:  97.6 F (36.4 C)    Physical Exam: Cardiac:  irregular Lungs:  CTA anteriorly  Incisions:  Wound vac in place with good seal; left thigh incision with continued bloody drainage   CBC    Component Value Date/Time   WBC 31.5 (H) 04/17/2016 0200   RBC 2.61 (L) 04/17/2016 0200   HGB 7.8 (L) 04/17/2016 0200   HCT 23.0 (L) 04/17/2016 0200   PLT 92 (L) 04/17/2016 0200   MCV 88.1 04/17/2016 0200   MCH 29.9 04/17/2016 0200   MCHC 33.9 04/17/2016 0200   RDW 15.7 (H) 04/17/2016 0200   LYMPHSABS 0.6 (L) 04/17/2016 0200   MONOABS 0.6 04/17/2016 0200   EOSABS 0.3 04/17/2016 0200   BASOSABS 0.0 04/17/2016 0200    BMET    Component Value Date/Time   NA 144 04/17/2016 0200   K 3.7 04/17/2016 0200   CL 115 (H) 04/17/2016 0200   CO2 26 04/17/2016 0200   GLUCOSE 122 (H) 04/17/2016 0200   BUN 22 (H) 04/17/2016 0200   CREATININE 0.84 04/17/2016 0200   CALCIUM 7.5 (L) 04/17/2016 0200   GFRNONAA >60 04/17/2016 0200   GFRAA >60 04/17/2016 0200    INR    Component Value Date/Time   INR 1.71 04/17/2016 0200     Intake/Output Summary (Last 24 hours) at 04/17/16 0729 Last data filed at 04/17/16 0700  Gross per 24 hour  Intake          6398.87 ml  Output             4715 ml  Net          1683.87 ml   NGT output: 580cc/24hr Abdominal wound vac:  3665cc/24hr UOP:  370cc/24hr   Assessment:  75 y.o. female is s/p:  Exploratory laparotomy, lysis of adhesions, aorto to superior mesenteric artery bypass using reversed right greater saphenous vein And  1. Small bowel resection without anastomosis. 2. Placement of open abdominal wound  VAC system. 3. Exploratory laparotomy with aorta to superior mesenteric artery     bypass with reverse saphenous vein graft by Dr. Oneida Alar along with a     segmental small bowel resection without anastomosis by Dr. Oneida Alar. 2 Day Post-Op And Exploratory laparotomy, small bowel resection x 2, placement of abdominal wound vac 1 Day Post-Op   Plan: -pt is still coagulopathic with INR of 1.7 today and hgb decreased to 7.8.  LFT's improved, but still elevated.  Thrombocytopenia worsenend yesterday at 38k and is 92k this am.   Still having some bleeding from wound vac and around vein harvest incision.   -receiving FFP/PRBC at this time  -leukocytosis worse today at 31.5k--pt is afebrile.  pt on Vanc, meropenem, and Fluconazole -UOP has dropped off-renal function normal -most likely return to OR tomorrow with general surgery  -TPN for severe protein calorie malnutrition   [name], PA-C [facility] [phone] 04/17/2016 7:29 AM   Off pressors overall more stable. Coags correcting. Trend hemoglobin. Some bloody drainage from right saphenectomy site Will need to be on heparin for her afib eventually but would continue  to hold for now and until hemoglobin is stable for at least 48 hours  Ruta Hinds, MD [facility] [place]: [phone] Pager: [phone]

## 2016-04-17 NOTE — Progress Notes (Signed)
PARENTERAL NUTRITION CONSULT NOTE - FOLLOW UP  Pharmacy Consult for TPN Indication: massive bowel resection  Patient Measurements: Height: '5\' 6"'$  (167.6 cm) Weight: 156 lb 15.5 oz (71.2 kg) IBW/kg (Calculated) : 59.3 Adjusted Body Weight: 63.9 kg   Vital Signs: Temp: 97.6 F (36.4 C) (07/31 0630) Temp Source: Axillary (07/31 0630) BP: 90/74 (07/31 0600) Pulse Rate: 87 (07/31 0700) Intake/Output from previous day: 07/30 0701 - 07/31 0700 In: 6398.9 [I.V.:1938.9; Blood:3660; NG/GT:150; IV Piggyback:650] Out: 3810 [Urine:370; Emesis/NG output:880; Drains:3565; Blood:100] Intake/Output from this shift: No intake/output data recorded.   Insulin Requirements in the past 24 hours:  3 units SSI  Assessment: 75 yo F w/ bowel ischemia, SMA thrombus, s/p OR 7/29 w/ mesenteric artery bypass and SBR, OR 7/30 2 more short segments of small bowel removed- per CCS note overall most of small bowel viable, SMA graft pulse palpable. Plan back to OR likely Tues.  To start TPN for severe protein calorie malnutrition. Critically ill on fent, phenyephrine and vasopressin drips and getting FFP, PRBCs  GI: Post op day #1 of ex lap, artery bypass and lysis of adhesions. Plan for OR again tomorrow. Albumin low at 2.1, prealbumin low at 15. Last BM was on 7/27. Endo: CBGs controlled 100-130s on SSI. Pepcid BID, will place into TPN Lytes: wnl today. K up to 3.7, Mg up to 1.9. Given Mg and potassium yesterday. CoCa 8.9. Has PRN replacements ordered for Mg and K. Renal: D5 1/2 NS at 28m/hr Pulm: Intubated with Fi02 of 40% Cards: BP soft on pressors. On vasopressin at 0.01 units/min. phenylephrine off Hepatobil: AST and ALT elevated but trending down. TBili elevated and up to 2.9 (no jaundice noted). Trig wnl. Neuro: On fentanyl at 1244m/hr ID: On meropenem, vancomycin, and fluconazole. Afebrile, WBC elevated at 31.5.    Best Practices: SCDs, famotidine TPN Access: PICC triple lumen TPN start date: 7/30  >>  Current Nutrition: NPO Clinimix E5/15 at 4068mr  Nutritional Goals:  Kcal: 1,800-2,000 Protein: 90-100 g F/U with RD recs  Plan:  Increase Clinimix E 5/15 to 62m33m Hold 20% lipid emulsion for first 7 days for ICU patients per ASPEN guidelines (Start date 8/6) Decrease D5 1/2NS to 30 ml/hr at 1800 today TPN provides 72 g of protein and 1022 kCals per day  Add MVI and TE in TPN Add famotidine '40mg'$  into TPN Continue sensitive SSI and adjust as needed Give potassium 10mE54m x 3 runs Monitor TPN labs F/U RD recs   NathaElenor QuinonesrmD, BCPS Clinical Pharmacist Pager [phone] 7:10 AM

## 2016-04-17 NOTE — Progress Notes (Signed)
SLP Cancellation Note  Patient Details Name: Amy [name] MRN: [id number] DOB: Mar 07, 1941   Cancelled treatment:       Reason Eval/Treat Not Completed: Medical issues which prohibited therapy. Pt remains intubated. SLP to sign off at this time. Given that pt was on dysphagia diet prior to intubation, recommend to re-order SLP evaluation prior to starting PO diet.   [name] 04/17/2016, 8:57 AM  [name], M.A. CCC-SLP [phone]

## 2016-04-17 NOTE — Clinical Social Work Note (Signed)
Patient transferred from 3S to 2S. Handoff information given to 2S CSW.  This CSW signing off.  Dayton Scrape, [place]

## 2016-04-17 NOTE — Progress Notes (Signed)
PULMONARY / CRITICAL CARE MEDICINE   Name: Amy [name] MRN: [id number] DOB: 1941-07-19    ADMISSION DATE:  04/04/2016 CONSULTATION DATE:  7/23  REFERRING MD:  [facility] hospital per family request   CHIEF COMPLAINT:  Acute encephalopathy and progressive respiratory failure   HISTORY OF PRESENT ILLNESS:   This is a 75 year old white female who was initially admitted to [facility] hospital where she underwent an elective loop colostomy takedown on 7/18. Her immediate post-op course was c/b lethargy and actually required narcan in PACU. She never really improved w/ hospital notes continuing to raise concern for altered mental status and difficulty to arouse. She ultimately required emergent intubation for hypercarbia (CO2 52) and inability to protect airway. She was transferred per family request on 7/23 for further evaluation and care. MRI confirmed new CVA. Suspect this was multifactorial: CVA, meds, +/- pneumonia. She was extubated on 7/24 and transferred to the SDU. On 7/28, she developed worsening abdominal pain and leukocytosis. 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 On 7/29, she underwent aorto to superior mesenteric artery bypass with greater saphenous vein graft and small bowel resection. In the immediate post-op period, she became hypotensive to the 50s and was started on pressors. She was transferred back to [facility] service.  SUBJECTIVE: on vaso, more products tx  VITAL SIGNS: BP 90/74   Pulse 63   Temp 97.6 F (36.4 C) (Axillary)   Resp 14   Ht '5\' 6"'$  (1.676 m)   Wt 71.2 kg (156 lb 15.5 oz)   SpO2 100%   BMI 25.34 kg/m   HEMODYNAMICS:    VENTILATOR SETTINGS: Vent Mode: PRVC FiO2 (%):  [40 %] 40 % Set Rate:  [14 bmp] 14 bmp Vt Set:  [470 mL] 470 mL PEEP:  [5 cmH20] 5 cmH20 Plateau Pressure:  [20 cmH20-21 cmH20] 21 cmH20  INTAKE / OUTPUT: I/O last 3 completed shifts: In: 11355.3 [I.V.:3580.3; NWGNF:6213; NG/GT:240; IV  Piggyback:1050] Out: 8405 [Urine:560; Emesis/NG output:1080; Drains:6665; Blood:100]  PHYSICAL EXAMINATION: General: Sedated. No acute distress. No family at bedside.  Integument:  Warm & dry. Abdominal wound VAC in place. HEENT:  Endotracheal tube in place. Line wnl Cardiovascular:  s1 s2 Regular rate. Normal S1 & S2. No appreciable JVD.  Pulmonary:  coarse Abdomen: Soft. Hypoactive bowel sounds. Nondistended. , vac Neurological: Sedated. Pupils symmetric.   LABS:  BMET  Recent Labs Lab 04/17/2016 1455 03/23/2016 0400 04/17/16 0200  NA 144 145 144  K 4.4 3.2* 3.7  CL 119* 118* 115*  CO2 16* 24 26  BUN 20 21* 22*  CREATININE 0.82 0.96 0.84  GLUCOSE 142* 128* 122*    Electrolytes  Recent Labs Lab 04/12/16 0355  04/08/2016 1455 04/11/2016 0400 04/17/16 0200  CALCIUM 8.1*  < > 9.3 8.1* 7.5*  MG 2.6*  < > 1.7 1.8 1.9  PHOS 3.0  --   --  2.9 2.9  < > = values in this interval not displayed.  CBC  Recent Labs Lab 03/22/2016 1040 04/01/2016 1254 04/02/2016 1644 04/17/16 0200  WBC 23.4*  --  25.4* 31.5*  HGB 7.0* 6.6* 8.6* 7.8*  HCT 20.7* 19.2* 25.3* 23.0*  PLT 71*  --  38* 92*    Coag's  Recent Labs Lab 03/20/2016 1254 03/19/2016 1644 04/17/16 0200  APTT 35 33 34  INR 1.91 1.60 1.71    Sepsis Markers  Recent Labs Lab 04/11/16 0410  04/17/2016 0330 04/02/2016 1455 04/03/2016 1814  LATICACIDVEN  --   < >  1.1 7.6* 4.3*  PROCALCITON 0.65  --   --   --   --   < > = values in this interval not displayed.  ABG  Recent Labs Lab 04/17/2016 1504 03/19/2016 2210 04/03/2016 0410  PHART 7.484* 7.516* 7.366  PCO2ART 25.4* 23.8* 40.4  PO2ART 463.0* 125.0* 110.0*    Liver Enzymes  Recent Labs Lab 03/24/2016 1455 03/20/2016 0400 04/17/16 0200  AST 103* 676* 239*  ALT 54 187* 108*  ALKPHOS 29* 65 71  BILITOT 1.3* 2.9* 2.9*  ALBUMIN 1.2* 2.5* 2.1*    Cardiac Enzymes No results for input(s): TROPONINI, PROBNP in the last 168 hours.  Glucose  Recent Labs Lab  03/27/2016 0336 03/31/2016 1130 04/11/2016 1620 04/12/2016 1953 04/17/16 0000 04/17/16 0359  GLUCAP 137* 130* 106* 127* 119* 137*    Imaging No results found.   STUDIES:  CT head (at [facility]): negative  MRI brain  7/23: Bilateral cerebral hemispheres and tight cerebellar hemisphere patchy acute and early subacute infarcts. No mass effect or hemorrhage. Central thromboembolic source is suspected.  EEG 7/23: Abnormal due to moderate diffuse slowing of the waking background. No seizure or epiletiform discharges.  TTE 7/24: EF 50-55%, lateral wall appears hypokinetic, systolic function normal, mild MR regurg Carotid US 7/24: right mild soft plaque CCA, mild to moderate plaque origin and proximal ICA and ECA 1-39% ICA stenosis.  Port CXR 7/30: ETT in good position. Enteric feeding tube coursing below diaphragm. Right internal jugular central venous catheter in good position. Bilateral lower lung opacity versus effusion unchanged.   MICROBIOLOGY: MRSA PCR 7/23:  Positive  Urine Ctx 7/23:  Negative  Blood Ctx x2 7/23:  Negative  Tracheal Asp Ctx 7/23:  MRSA Blood Ctx x2 7/28>>> Urine Ctx 7/28:  Negative  ANTIBIOTICS: Vancomycin 7/23 >> Meropenem 7/23 >> Diflucan 7/27 >>   SIGNIFICANT EVENTS: Loop colostomy takedown 7/18 7/18-7/22: progressively lethargic  7/23 transferred to [facility] s/p getting intubated for AMS and hypercarbia. Surgical service consulted at [facility] on arrival  7/24 on CCB gtt for af. But fully awake and following commands. Extubated. 7/25 SLP evaluation for diet, ASA started, FEES 7/28 developed worsening abdominal pain, leukocytosis, CT showing complete SMA occlusion 7/29 Underwent aorto to SMA bypass with greater saphenous vein graft and small bowel resection 7/29 Developed hypotension to the 50s in post-op period, started on pressors 7/29 Code status changed to DNR 7/30 Ex Lap by CCS w/ necrotic bowel & ischemia s/p resection w/ intact bypass & oozing over peritoneal  surfaces.  LINES/TUBES: OETT 7/23>>>7/24; 7/29 >> R IJ CVL 7/19 >> L Radial Art Line 7/29 >>  FOLEY 7/29 >> R NGT 7/29 >> IABP 7/29 >>?  ASSESSMENT / PLAN:  PULMONARY A: Acute Hypercarbic Respiratory Failure Acute Hypoxic Respiratory Failure MRSA Pneumonia H/O COPD H/O OSA (intolerant of CPAP) H/O RUL Lobectomy - For NSCLC.  P:   Full vent support Pulmicort Neb bid Change Duoneb to [employer] reviewed, keep same MV, following commands, no pressors - can SBT No extubation with re look planned x 2 more Would repeat pcxr for increasing LLL hazziness frmo baseline  CARDIOVASCULAR A:  Shock - Septic vs Hypovolemic. Atrial Fibrillation Elevated Troponin I - Likely demand ischemia. 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  Bowel Ischemia - S/P resection 7/29 & further resection 7/30. Shock Liver H/O GERD  P:   NPO TPN Pepcid IV q12hr Further Management per CCS Trending LFTs in am  relook in am   RENAL A:   Hypokalemia - Replaced. [place] - Resolving. Likely due to hyperchloremia. Hyperchloremia - Stable.  P:   No saline with hyperchloremia Keep d51/2 NS Get cvp, likely can kvo that tpn going She is getting a lot of volume from products  HEMATOLOGIC A:   DIC/Coagulopathy - S/P Massive Transfusion. S/P 8u FFP 7/30 & 8u FFP 7/29. Anemia - Secondary to acute blood loss. S/P 4u PRBC 7/30 & 8u PRBC 7/29. Leukocytosis - Secondary to Sepsis. Thrombocytopenia - Secondary to consumption w/ DIC. S/P 1u Platelets this AM. 2u Platelets 7/29. Embolic CVA  P:  We need to stop the cycle of continued products and affect of diltuion No ffp planned today Plat rise noted, no plat Tx planned Cbc Seriel scd  INFECTIOUS A:    Sepsis MRSA Pneumonia- possible  P:   Continuing Empiric Diflucan, Merrem, & Vancocin - no changes at this time likely in am  pcxr in am   ENDOCRINE A:   H/O Hypothyroidism (TSH 28.2) Hypoglycemia - Resolved w/ D5 IVF.  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   P:   Neurology following for stroke Goal RASS: -2 Fentanyl gtt & IV prn Pain Versed IV prn Sedation - would like to avoid this  FAMILY UPDATE: No family at bedside 7/30.   Ccm time 35 min  DLavon Paganini FTitus Mould MD, FRed WillowPgr: 3[facility]

## 2016-04-17 NOTE — Progress Notes (Signed)
eLink Physician-Brief Progress Note Patient Name: Amy [name] DOB: March 21, 1941 MRN: [id number]   Date of Service  04/17/2016  HPI/Events of Note  Still bleeding but not as much. Hb 7.8 plt 92 INR 1.7 Fibrinogen 300  eICU Interventions  Transfuse 1 u pRBC and 2 u FFP     Intervention Category Intermediate Interventions: Other:  Rush Landmark 04/17/2016, 5:57 AM

## 2016-04-17 NOTE — Progress Notes (Signed)
[facility] Progress Note 1 Day Post-Op  Subjective: Patient is stable off pressors.  No acute distress.  Will respond to some degree and follows commands.  Objective: Vital signs in last 24 hours: Temp:  [94.1 F (34.5 C)-97.8 F (36.6 C)] 97.6 F (36.4 C) (07/31 0800) Pulse Rate:  [25-113] 105 (07/31 1030) Resp:  [12-18] 18 (07/31 1030) BP: (90-147)/(32-111) 138/103 (07/31 1000) SpO2:  [63 %-100 %] 100 % (07/31 1030) Arterial Line BP: (90-148)/(42-68) 110/53 (07/31 1030) FiO2 (%):  [40 %] 40 % (07/31 0828) Weight:  [71.2 kg (156 lb 15.5 oz)] 71.2 kg (156 lb 15.5 oz) (07/31 0300) Last BM Date: 04/13/16  Intake/Output from previous day: 07/30 0701 - 07/31 0700 In: 6398.9 [I.V.:1938.9; Blood:3660; NG/GT:150; IV Piggyback:650] Out: 7616 [Urine:370; Emesis/NG output:580; Drains:3665; Blood:100] Intake/Output this shift: Total I/O In: 073 [I.V.:313; Blood:248; NG/GT:30; IV Piggyback:200] Out: 710 [Urine:50; Emesis/NG output:100; Drains:525]  General: Sedated with fentanyl only.  Lungs: Clear  Abd: Open and drainage is less bloody.  Extremities: No changes  Neuro: Intact, but sedated.  Will request labs  Lab Results:  '@LABLAST2'$ (wbc:2,hgb:2,hct:2,plt:2) BMET ) Recent Labs  03/19/2016 0400 04/17/16 0200  NA 145 144  K 3.2* 3.7  CL 118* 115*  CO2 24 26  GLUCOSE 128* 122*  BUN 21* 22*  CREATININE 0.96 0.84  CALCIUM 8.1* 7.5*   PT/INR  Recent Labs  03/27/2016 1644 04/17/16 0200  LABPROT 19.3* 20.2*  INR 1.60 1.71   ABG  Recent Labs  03/20/2016 2210 04/11/2016 0410  PHART 7.516* 7.366  HCO3 19.3* 23.3    Studies/Results: Dg Chest Port 1 View  Result Date: 04/12/2016 CLINICAL DATA:  Central line placement EXAM: PORTABLE CHEST 1 VIEW COMPARISON:  04/08/2016 FINDINGS: Right central line, endotracheal tube and NG tube remain in place, unchanged. Stable right apical pleural/parenchymal thickening and postoperative changes. Bibasilar opacities, left greater than  right. Suspect small bilateral effusions. Findings similar to prior study. Heart is upper limits normal in size. IMPRESSION: Bibasilar atelectasis or infiltrates with layering effusions. No real change. Electronically Signed   By: Rolm Baptise M.D.   On: 03/24/2016 07:27  Dg Chest Port 1 View  Result Date: 04/14/2016 CLINICAL DATA:  Central line placement EXAM: PORTABLE CHEST 1 VIEW COMPARISON:  04/13/2016 FINDINGS: Right central line remains in place with the tip in the SVC. Endotracheal tube is now placed with the ti no acute bony abnormality. P 3 cm above the carina. NG tube has been placed into the stomach. Heart is normal size. Layering left pleural effusion. Left lower lobe atelectasis. Stable right lower lobe pleural thickening with surgical clips in the region. No acute bony abnormality. IMPRESSION: Layering left pleural effusion with left lower lobe atelectasis. Endotracheal tube 3 cm above the carina. NG tube enters the stomach. Right central line is unchanged. Electronically Signed   By: Rolm Baptise M.D.   On: 04/07/2016 15:13   Anti-infectives: Anti-infectives    Start     Dose/Rate Route Frequency Ordered Stop   04/13/16 2130  fluconazole (DIFLUCAN) IVPB 200 mg     200 mg 100 mL/hr over 60 Minutes Intravenous Every 24 hours 04/13/16 2033     04/13/16 1700  vancomycin (VANCOCIN) IVPB 750 mg/150 ml premix     750 mg 150 mL/hr over 60 Minutes Intravenous Every 24 hours 04/12/16 1814     04/12/16 2000  fluconazole (DIFLUCAN) tablet 200 mg  Status:  Discontinued     200 mg Oral Daily 04/12/16 1758 04/13/16 2028  04/12/16 1400  meropenem (MERREM) 1 g in sodium chloride 0.9 % 100 mL IVPB     1 g 200 mL/hr over 30 Minutes Intravenous Every 8 hours 04/12/16 1150     04/10/16 0500  meropenem (MERREM) 1 g in sodium chloride 0.9 % 100 mL IVPB  Status:  Discontinued     1 g 200 mL/hr over 30 Minutes Intravenous Every 12 hours 03/25/2016 1654 04/12/16 1150   04/10/16 0500  vancomycin  (VANCOCIN) 500 mg in sodium chloride 0.9 % 100 mL IVPB  Status:  Discontinued     500 mg 100 mL/hr over 60 Minutes Intravenous Every 12 hours 04/17/2016 1654 04/12/16 1814   04/08/2016 1700  meropenem (MERREM) 1 g in sodium chloride 0.9 % 100 mL IVPB     1 g 200 mL/hr over 30 Minutes Intravenous  Once 04/07/2016 1638 03/22/2016 1730   04/09/2016 1700  vancomycin (VANCOCIN) IVPB 1000 mg/200 mL premix  Status:  Discontinued     1,000 mg 200 mL/hr over 60 Minutes Intravenous  Once 04/12/2016 1638 03/28/2016 1641   04/03/2016 1700  vancomycin (VANCOCIN) 1,250 mg in sodium chloride 0.9 % 250 mL IVPB     1,250 mg 166.7 mL/hr over 90 Minutes Intravenous  Once 03/30/2016 1641 04/13/2016 1900      Assessment/Plan: s/p Procedure(s): EXPLORATORY LAPAROTOMY SMALL BOWEL RESECTION APPLICATION OF WOUND VBack to surgery tomorrow for reanastomosis orn closure of the abdomen. possible.Back to surgery tomorrow for reanastomosis orn closure of the abdomen. possible.  LOS: 8 days   [name]. Dahlia Bailiff, MD, FACS [phone] [facility] 04/17/2016

## 2016-04-17 NOTE — Progress Notes (Signed)
eLink Physician-Brief Progress Note Patient Name: Amy [name] DOB: 1941-06-02 MRN: [id number]   Date of Service  04/17/2016  HPI/Events of Note  vanc elevated  eICU Interventions  RN to notify pharmacy     Intervention Category Minor Interventions: Routine modifications to care plan (e.g. PRN medications for pain, fever)  [name] 04/17/2016, 6:00 PM

## 2016-04-17 NOTE — Progress Notes (Signed)
ANTIBIOTIC CONSULT NOTE - INITIAL  Pharmacy Consult for vancomycin Indication: MRSA pneumonia / sepsis  Allergies  Allergen Reactions  . Ativan [Lorazepam] Other (See Comments)    Confusion and agitation   . Ace Inhibitors Cough  . Codeine Nausea Only  . Morphine And Related     Went crazy  . Penicillins Itching    No other information available at this time 03/30/2016  . Prednisone Other (See Comments)    Head aches   . Spiriva Handihaler [Tiotropium Bromide Monohydrate] Other (See Comments)    Urinary retention     Patient Measurements: Height: '5\' 6"'$  (167.6 cm) Weight: 156 lb 15.5 oz (71.2 kg) IBW/kg (Calculated) : 59.3 Adjusted Body Weight:   Vital Signs: Temp: 97.5 F (36.4 C) (07/31 1716) Temp Source: Axillary (07/31 1716) BP: 129/60 (07/31 1627) Pulse Rate: 112 (07/31 1627) Intake/Output from previous day: 07/30 0701 - 07/31 0700 In: 6398.9 [I.V.:1938.9; Blood:3660; NG/GT:150; IV Piggyback:650] Out: 7001 [Urine:370; Emesis/NG output:580; Drains:3665; Blood:100] Intake/Output from this shift: Total I/O In: 1501 [I.V.:668; Blood:473; NG/GT:60; IV Piggyback:300] Out: 1435 [Urine:110; Emesis/NG output:200; Drains:1125]  Labs:  Recent Labs  03/27/2016 1455  04/06/2016 0400  03/21/2016 1644 04/17/16 0200 04/17/16 1128  WBC QUESTIONABLE RESULTS, RECOMMEND RECOLLECT TO VERIFY  < > 27.6*  < > 25.4* 31.5* 33.8*  HGB 3.3*  < > 7.3*  < > 8.6* 7.8* 9.2*  PLT QUESTIONABLE RESULTS, RECOMMEND RECOLLECT TO VERIFY  < > 73*  < > 38* 92* 66*  CREATININE 0.82  --  0.96  --   --  0.84  --   < > = values in this interval not displayed. Estimated Creatinine Clearance: 56.8 mL/min (by C-G formula based on SCr of 0.84 mg/dL).  Recent Labs  04/17/16 1700  [place] 21*     Microbiology: Recent Results (from the past 720 hour(s))  MRSA PCR Screening     Status: Abnormal   Collection Time: 04/04/2016  2:15 PM  Result Value Ref Range Status   MRSA by PCR POSITIVE (A) NEGATIVE  Final    Comment:        The GeneXpert MRSA Assay (FDA approved for NASAL specimens only), is one component of a comprehensive MRSA colonization surveillance program. It is not intended to diagnose MRSA infection nor to guide or monitor treatment for MRSA infections. RESULT CALLED TO, READ BACK BY AND VERIFIED WITH: L.VERNON,RN 03/19/2016 '@1808'$  BY V.WILKINS   Culture, blood (routine x 2)     Status: None   Collection Time: 03/20/2016  3:17 PM  Result Value Ref Range Status   Specimen Description BLOOD RIGHT HAND  Final   Special Requests IN PEDIATRIC BOTTLE .5CC  Final   Culture NO GROWTH 5 DAYS  Final   Report Status 04/14/2016 FINAL  Final  Urine culture     Status: None   Collection Time: 04/14/2016  3:50 PM  Result Value Ref Range Status   Specimen Description URINE, CATHETERIZED  Final   Special Requests NONE  Final   Culture NO GROWTH  Final   Report Status 04/10/2016 FINAL  Final  Culture, blood (routine x 2)     Status: None   Collection Time: 04/08/2016  4:11 PM  Result Value Ref Range Status   Specimen Description BLOOD LEFT FATTY CASTS  Final   Special Requests IN PEDIATRIC BOTTLE .5CC  Final   Culture NO GROWTH 5 DAYS  Final   Report Status 04/14/2016 FINAL  Final  Culture, respiratory (tracheal aspirate)  Status: None   Collection Time: 04/11/2016  5:08 PM  Result Value Ref Range Status   Specimen Description TRACHEAL ASPIRATE  Final   Special Requests NONE  Final   Gram Stain   Final    ABUNDANT WBC PRESENT, PREDOMINANTLY PMN FEW SQUAMOUS EPITHELIAL CELLS PRESENT ABUNDANT GRAM POSITIVE COCCI IN PAIRS IN CLUSTERS RARE BUDDING YEAST SEEN    Culture   Final    ABUNDANT METHICILLIN RESISTANT STAPHYLOCOCCUS AUREUS   Report Status 04/12/2016 FINAL  Final   Organism ID, Bacteria METHICILLIN RESISTANT STAPHYLOCOCCUS AUREUS  Final      Susceptibility   Methicillin resistant staphylococcus aureus - MIC*    CIPROFLOXACIN >=8 RESISTANT Resistant     ERYTHROMYCIN >=8  RESISTANT Resistant     GENTAMICIN <=0.5 SENSITIVE Sensitive     OXACILLIN >=4 RESISTANT Resistant     TETRACYCLINE 2 SENSITIVE Sensitive     VANCOMYCIN <=0.5 SENSITIVE Sensitive     TRIMETH/SULFA <=10 SENSITIVE Sensitive     CLINDAMYCIN >=8 RESISTANT Resistant     RIFAMPIN <=0.5 SENSITIVE Sensitive     Inducible Clindamycin NEGATIVE Sensitive     * ABUNDANT METHICILLIN RESISTANT STAPHYLOCOCCUS AUREUS  Culture, blood (routine x 2)     Status: None (Preliminary result)   Collection Time: 04/14/16  6:55 PM  Result Value Ref Range Status   Specimen Description BLOOD BLOOD RIGHT WRIST  Final   Special Requests IN PEDIATRIC BOTTLE 1.5CC  Final   Culture NO GROWTH 3 DAYS  Final   Report Status PENDING  Incomplete  Culture, blood (routine x 2)     Status: None (Preliminary result)   Collection Time: 04/14/16  7:07 PM  Result Value Ref Range Status   Specimen Description BLOOD LEFT HAND  Final   Special Requests IN PEDIATRIC BOTTLE 4.5CC  Final   Culture NO GROWTH 3 DAYS  Final   Report Status PENDING  Incomplete  Urine culture     Status: None   Collection Time: 04/14/16 11:30 PM  Result Value Ref Range Status   Specimen Description URINE, CLEAN CATCH  Final   Special Requests NONE  Final   Culture NO GROWTH  Final   Report Status 04/14/2016 FINAL  Final    Medical History: Past Medical History:  Diagnosis Date  . Bilateral carotid artery stenosis   . COPD (chronic obstructive pulmonary disease) ([place])   . GERD (gastroesophageal reflux disease)   . HTN (hypertension)   . Hypothyroidism   . Non-small cell lung cancer ([place])   . OSA (obstructive sleep apnea)    intolerant of CPAP    Medications:  Scheduled:  . antiseptic oral rinse  7 mL Mouth Rinse QID  . budesonide (PULMICORT) nebulizer solution  0.5 mg Nebulization BID  . chlorhexidine  15 mL Mouth Rinse BID  . fluconazole (DIFLUCAN) IV  200 mg Intravenous Q24H  . insulin aspart  0-9 Units Subcutaneous Q4H  .  ipratropium-albuterol  3 mL Nebulization Q6H  . levothyroxine  44 mcg Intravenous Daily  . meropenem (MERREM) IV  1 g Intravenous Q8H  . sodium chloride flush  10-40 mL Intracatheter Q12H  . vancomycin  750 mg Intravenous Q24H   Infusions:  . Marland KitchenTPN (CLINIMIX-E) Adult Stopped (04/17/16 1706)  . Marland KitchenTPN (CLINIMIX-E) Adult 60 mL/hr at 04/17/16 1706  . dextrose 5 % and 0.45% NaCl 10 mL/hr (04/17/16 0834)  . fentaNYL infusion INTRAVENOUS 200 mcg/hr (04/17/16 1600)  . phenylephrine (NEO-SYNEPHRINE) Adult infusion 20 mcg/min (04/17/16 1604)   Assessment:  75 yo female with MRSA PNA and sepsis is currently on slightly supratherapeutic vancomycin.  Vancomycin trough was 21 and expect patient to accumulate.  Goal of Therapy:  Vancomycin trough level 15-20 mcg/ml  Plan:  - change vancomycin to 500 mg iv q24h - recheck trough at steady state   Emerie Vanderkolk, Tsz-Yin 04/17/2016,5:42 PM

## 2016-04-18 ENCOUNTER — Inpatient Hospital Stay (HOSPITAL_COMMUNITY): Payer: Medicare Other

## 2016-04-18 ENCOUNTER — Encounter (HOSPITAL_COMMUNITY): Admission: EM | Disposition: E | Payer: Self-pay | Source: Other Acute Inpatient Hospital | Attending: Internal Medicine

## 2016-04-18 ENCOUNTER — Inpatient Hospital Stay (HOSPITAL_COMMUNITY): Payer: Medicare Other | Admitting: Certified Registered Nurse Anesthetist

## 2016-04-18 DIAGNOSIS — K559 Vascular disorder of intestine, unspecified: Secondary | ICD-10-CM

## 2016-04-18 HISTORY — PX: BOWEL RESECTION: SHX1257

## 2016-04-18 LAB — TYPE AND SCREEN
ABO/RH(D): O POS
ANTIBODY SCREEN: POSITIVE
DAT, IGG: NEGATIVE
DONOR AG TYPE: NEGATIVE
DONOR AG TYPE: NEGATIVE
DONOR AG TYPE: NEGATIVE
DONOR AG TYPE: NEGATIVE
DONOR AG TYPE: NEGATIVE
DONOR AG TYPE: NEGATIVE
DONOR AG TYPE: NEGATIVE
DONOR AG TYPE: NEGATIVE
DONOR AG TYPE: NEGATIVE
Donor AG Type: NEGATIVE
Donor AG Type: NEGATIVE
Donor AG Type: NEGATIVE
Donor AG Type: NEGATIVE
Donor AG Type: NEGATIVE
Donor AG Type: NEGATIVE
Donor AG Type: NEGATIVE
Donor AG Type: NEGATIVE
Donor AG Type: NEGATIVE
Donor AG Type: NEGATIVE
PT AG TYPE: NEGATIVE
UNIT DIVISION: 0
UNIT DIVISION: 0
UNIT DIVISION: 0
UNIT DIVISION: 0
UNIT DIVISION: 0
UNIT DIVISION: 0
UNIT DIVISION: 0
UNIT DIVISION: 0
UNIT DIVISION: 0
Unit division: 0
Unit division: 0
Unit division: 0
Unit division: 0
Unit division: 0
Unit division: 0
Unit division: 0
Unit division: 0
Unit division: 0
Unit division: 0

## 2016-04-18 LAB — GLUCOSE, CAPILLARY
GLUCOSE-CAPILLARY: 105 mg/dL — AB (ref 65–99)
GLUCOSE-CAPILLARY: 102 mg/dL — AB (ref 65–99)
GLUCOSE-CAPILLARY: 144 mg/dL — AB (ref 65–99)
Glucose-Capillary: 115 mg/dL — ABNORMAL HIGH (ref 65–99)
Glucose-Capillary: 121 mg/dL — ABNORMAL HIGH (ref 65–99)

## 2016-04-18 LAB — CBC WITH DIFFERENTIAL/PLATELET
BASOS ABS: 0 10*3/uL (ref 0.0–0.1)
BASOS ABS: 0 10*3/uL (ref 0.0–0.1)
BASOS PCT: 0 %
BASOS PCT: 0 %
Basophils Absolute: 0.1 10*3/uL (ref 0.0–0.1)
Basophils Relative: 0 %
EOS ABS: 1.4 10*3/uL — AB (ref 0.0–0.7)
Eosinophils Absolute: 1.5 10*3/uL — ABNORMAL HIGH (ref 0.0–0.7)
Eosinophils Absolute: 1.7 10*3/uL — ABNORMAL HIGH (ref 0.0–0.7)
Eosinophils Relative: 4 %
Eosinophils Relative: 4 %
Eosinophils Relative: 5 %
HCT: 28.4 % — ABNORMAL LOW (ref 36.0–46.0)
HCT: 30 % — ABNORMAL LOW (ref 36.0–46.0)
HEMATOCRIT: 28.3 % — AB (ref 36.0–46.0)
HEMOGLOBIN: 9.4 g/dL — AB (ref 12.0–15.0)
Hemoglobin: 9.2 g/dL — ABNORMAL LOW (ref 12.0–15.0)
Hemoglobin: 9.8 g/dL — ABNORMAL LOW (ref 12.0–15.0)
LYMPHS ABS: 0.6 10*3/uL — AB (ref 0.7–4.0)
LYMPHS ABS: 1 10*3/uL (ref 0.7–4.0)
LYMPHS PCT: 1 %
Lymphocytes Relative: 2 %
Lymphocytes Relative: 3 %
Lymphs Abs: 0.4 10*3/uL — ABNORMAL LOW (ref 0.7–4.0)
MCH: 29.8 pg (ref 26.0–34.0)
MCH: 29.6 pg (ref 26.0–34.0)
MCH: 29.5 pg (ref 26.0–34.0)
MCHC: 33.2 g/dL (ref 30.0–36.0)
MCHC: 32.4 g/dL (ref 30.0–36.0)
MCHC: 32.7 g/dL (ref 30.0–36.0)
MCV: 89.8 fL (ref 78.0–100.0)
MCV: 91.3 fL (ref 78.0–100.0)
MCV: 90.4 fL (ref 78.0–100.0)
MONO ABS: 1.4 10*3/uL — AB (ref 0.1–1.0)
MONO ABS: 1.8 10*3/uL — AB (ref 0.1–1.0)
MONO ABS: 1.4 10*3/uL — AB (ref 0.1–1.0)
MONOS PCT: 4 %
MONOS PCT: 4 %
Monocytes Relative: 5 %
NEUTROS ABS: 32.5 10*3/uL — AB (ref 1.7–7.7)
NEUTROS PCT: 90 %
NEUTROS PCT: 90 %
NEUTROS PCT: 88 %
Neutro Abs: 31.7 10*3/uL — ABNORMAL HIGH (ref 1.7–7.7)
Neutro Abs: 30.2 10*3/uL — ABNORMAL HIGH (ref 1.7–7.7)
PLATELETS: 63 10*3/uL — AB (ref 150–400)
PLATELETS: 58 10*3/uL — AB (ref 150–400)
Platelets: 71 10*3/uL — ABNORMAL LOW (ref 150–400)
RBC: 3.15 MIL/uL — ABNORMAL LOW (ref 3.87–5.11)
RBC: 3.11 MIL/uL — ABNORMAL LOW (ref 3.87–5.11)
RBC: 3.32 MIL/uL — AB (ref 3.87–5.11)
RDW: 16.8 % — AB (ref 11.5–15.5)
RDW: 17 % — ABNORMAL HIGH (ref 11.5–15.5)
RDW: 16.9 % — AB (ref 11.5–15.5)
WBC: 36 10*3/uL — ABNORMAL HIGH (ref 4.0–10.5)
WBC: 35.3 10*3/uL — ABNORMAL HIGH (ref 4.0–10.5)
WBC: 34.3 10*3/uL — AB (ref 4.0–10.5)

## 2016-04-18 LAB — POCT I-STAT 3, ART BLOOD GAS (G3+)
Acid-Base Excess: 4 mmol/L — ABNORMAL HIGH (ref 0.0–2.0)
BICARBONATE: 30.5 meq/L — AB (ref 20.0–24.0)
O2 Saturation: 99 %
PCO2 ART: 52.2 mmHg — AB (ref 35.0–45.0)
Patient temperature: 97.8
TCO2: 32 mmol/L (ref 0–100)
pH, Arterial: 7.372 (ref 7.350–7.450)
pO2, Arterial: 132 mmHg — ABNORMAL HIGH (ref 80.0–100.0)

## 2016-04-18 LAB — PREPARE FRESH FROZEN PLASMA
UNIT DIVISION: 0
Unit division: 0

## 2016-04-18 LAB — PROTIME-INR
INR: 1.37
PROTHROMBIN TIME: 16.9 s — AB (ref 11.4–15.2)

## 2016-04-18 LAB — PHOSPHORUS: Phosphorus: 3.1 mg/dL (ref 2.5–4.6)

## 2016-04-18 LAB — COMPREHENSIVE METABOLIC PANEL
ALBUMIN: 1.8 g/dL — AB (ref 3.5–5.0)
ALT: 107 U/L — AB (ref 14–54)
AST: 183 U/L — AB (ref 15–41)
Alkaline Phosphatase: 95 U/L (ref 38–126)
Anion gap: 3 — ABNORMAL LOW (ref 5–15)
BUN: 28 mg/dL — AB (ref 6–20)
CHLORIDE: 115 mmol/L — AB (ref 101–111)
CO2: 26 mmol/L (ref 22–32)
CREATININE: 0.96 mg/dL (ref 0.44–1.00)
Calcium: 7.7 mg/dL — ABNORMAL LOW (ref 8.9–10.3)
GFR calc Af Amer: >60 mL/min (ref >60)
GFR, EST NON AFRICAN AMERICAN: 56 mL/min — AB (ref >60)
GLUCOSE: 116 mg/dL — AB (ref 65–99)
POTASSIUM: 4.3 mmol/L (ref 3.5–5.1)
SODIUM: 144 mmol/L (ref 135–145)
Total Bilirubin: 3.1 mg/dL — ABNORMAL HIGH (ref 0.3–1.2)
Total Protein: 3.7 g/dL — ABNORMAL LOW (ref 6.5–8.1)

## 2016-04-18 LAB — PREPARE RBC (CROSSMATCH)

## 2016-04-18 LAB — APTT: APTT: 31 s (ref 24–36)

## 2016-04-18 LAB — FACTOR 8 ASSAY: COAGULATION FACTOR VIII: 182 % — AB (ref 57–163)

## 2016-04-18 LAB — MAGNESIUM: Magnesium: 2 mg/dL (ref 1.7–2.4)

## 2016-04-18 LAB — FIBRINOGEN: Fibrinogen: 389 mg/dL (ref 210–475)

## 2016-04-18 SURGERY — EXCISION, SMALL INTESTINE
Anesthesia: General | Site: Abdomen

## 2016-04-18 MED ORDER — TRACE MINERALS CR-CU-MN-SE-ZN 10-1000-500-60 MCG/ML IV SOLN
INTRAVENOUS | Status: AC
  Administered 2016-04-18: 17:00:00 via INTRAVENOUS
  Filled 2016-04-18: qty 1920

## 2016-04-18 MED ORDER — PROPOFOL 10 MG/ML IV BOLUS
INTRAVENOUS | Status: AC
  Filled 2016-04-18: qty 20

## 2016-04-18 MED ORDER — PROPOFOL 10 MG/ML IV BOLUS
INTRAVENOUS | Status: DC | PRN
  Administered 2016-04-18: 30 mg via INTRAVENOUS

## 2016-04-18 MED ORDER — DEXTROSE 5 % IV SOLN
2.0000 g | INTRAVENOUS | Status: AC
  Administered 2016-04-18 – 2016-04-28 (×11): 2 g via INTRAVENOUS
  Filled 2016-04-18 (×11): qty 2

## 2016-04-18 MED ORDER — POVIDONE-IODINE 10 % OINT PACKET
TOPICAL_OINTMENT | CUTANEOUS | Status: DC | PRN
  Administered 2016-04-18: 1 via TOPICAL

## 2016-04-18 MED ORDER — FENTANYL CITRATE (PF) 250 MCG/5ML IJ SOLN
INTRAMUSCULAR | Status: AC
  Filled 2016-04-18: qty 5

## 2016-04-18 MED ORDER — MIDAZOLAM HCL 2 MG/2ML IJ SOLN
INTRAMUSCULAR | Status: AC
  Filled 2016-04-18: qty 2

## 2016-04-18 MED ORDER — 0.9 % SODIUM CHLORIDE (POUR BTL) OPTIME
TOPICAL | Status: DC | PRN
  Administered 2016-04-18: 5000 mL

## 2016-04-18 MED ORDER — FENTANYL CITRATE (PF) 100 MCG/2ML IJ SOLN
INTRAMUSCULAR | Status: DC | PRN
  Administered 2016-04-18: 50 ug via INTRAVENOUS

## 2016-04-18 MED ORDER — ROCURONIUM BROMIDE 50 MG/5ML IV SOLN
INTRAVENOUS | Status: AC
  Filled 2016-04-18: qty 1

## 2016-04-18 MED ORDER — LIDOCAINE 2% (20 MG/ML) 5 ML SYRINGE
INTRAMUSCULAR | Status: AC
  Filled 2016-04-18: qty 5

## 2016-04-18 MED ORDER — ROCURONIUM BROMIDE 100 MG/10ML IV SOLN
INTRAVENOUS | Status: DC | PRN
  Administered 2016-04-18: 10 mg via INTRAVENOUS
  Administered 2016-04-18: 40 mg via INTRAVENOUS

## 2016-04-18 MED ORDER — LACTATED RINGERS IV SOLN
INTRAVENOUS | Status: DC | PRN
  Administered 2016-04-18: 13:00:00 via INTRAVENOUS

## 2016-04-18 MED ORDER — MIDAZOLAM HCL 5 MG/5ML IJ SOLN
INTRAMUSCULAR | Status: DC | PRN
  Administered 2016-04-18: 2 mg via INTRAVENOUS

## 2016-04-18 SURGICAL SUPPLY — 47 items
BENZOIN TINCTURE PRP APPL 2/3 (GAUZE/BANDAGES/DRESSINGS) ×8 IMPLANT
BLADE SURG ROTATE 9660 (MISCELLANEOUS) IMPLANT
CANISTER SUCTION 2500CC (MISCELLANEOUS) ×4 IMPLANT
CANISTER WOUND CARE 500ML ATS (WOUND CARE) ×4 IMPLANT
CHLORAPREP W/TINT 26ML (MISCELLANEOUS) ×4 IMPLANT
COVER SURGICAL LIGHT HANDLE (MISCELLANEOUS) ×4 IMPLANT
DRAPE LAPAROSCOPIC ABDOMINAL (DRAPES) ×4 IMPLANT
DRAPE WARM FLUID 44X44 (DRAPE) ×4 IMPLANT
DRSG OPSITE POSTOP 4X10 (GAUZE/BANDAGES/DRESSINGS) IMPLANT
DRSG OPSITE POSTOP 4X8 (GAUZE/BANDAGES/DRESSINGS) IMPLANT
DRSG VAC ATS LRG SENSATRAC (GAUZE/BANDAGES/DRESSINGS) ×4 IMPLANT
ELECT BLADE 6.5 EXT (BLADE) IMPLANT
ELECT CAUTERY BLADE 6.4 (BLADE) ×4 IMPLANT
ELECT REM PT RETURN 9FT ADLT (ELECTROSURGICAL) ×4
ELECTRODE REM PT RTRN 9FT ADLT (ELECTROSURGICAL) ×2 IMPLANT
GLOVE BIOGEL PI IND STRL 8 (GLOVE) ×2 IMPLANT
GLOVE BIOGEL PI INDICATOR 8 (GLOVE) ×2
GLOVE ECLIPSE 7.5 STRL STRAW (GLOVE) ×4 IMPLANT
GOWN STRL REUS W/ TWL LRG LVL3 (GOWN DISPOSABLE) ×4 IMPLANT
GOWN STRL REUS W/ TWL XL LVL3 (GOWN DISPOSABLE) ×2 IMPLANT
GOWN STRL REUS W/TWL LRG LVL3 (GOWN DISPOSABLE) ×4
GOWN STRL REUS W/TWL XL LVL3 (GOWN DISPOSABLE) ×2
KIT BASIN OR (CUSTOM PROCEDURE TRAY) ×4 IMPLANT
KIT ROOM TURNOVER OR (KITS) ×4 IMPLANT
LIGASURE IMPACT 36 18CM CVD LR (INSTRUMENTS) ×4 IMPLANT
NS IRRIG 1000ML POUR BTL (IV SOLUTION) ×8 IMPLANT
PACK GENERAL/GYN (CUSTOM PROCEDURE TRAY) ×4 IMPLANT
PAD ARMBOARD 7.5X6 YLW CONV (MISCELLANEOUS) ×4 IMPLANT
RELOAD PROXIMATE 75MM BLUE (ENDOMECHANICALS) ×12 IMPLANT
RELOAD PROXIMATE TA60MM BLUE (ENDOMECHANICALS) ×4 IMPLANT
SEPRAFILM PROCEDURAL PACK 3X5 (MISCELLANEOUS) IMPLANT
SPECIMEN JAR LARGE (MISCELLANEOUS) ×4 IMPLANT
SPONGE ABDOMINAL VAC ABTHERA (MISCELLANEOUS) ×4 IMPLANT
SPONGE LAP 18X18 X RAY DECT (DISPOSABLE) IMPLANT
STAPLER GUN LINEAR PROX 60 (STAPLE) ×4 IMPLANT
STAPLER PROXIMATE 75MM BLUE (STAPLE) ×4 IMPLANT
STAPLER VISISTAT 35W (STAPLE) ×4 IMPLANT
SUCTION POOLE TIP (SUCTIONS) ×4 IMPLANT
SUT NOVA 1 T20/GS 25DT (SUTURE) IMPLANT
SUT PDS AB 1 TP1 96 (SUTURE) ×8 IMPLANT
SUT SILK 2 0 SH CR/8 (SUTURE) ×16 IMPLANT
SUT SILK 2 0 TIES 10X30 (SUTURE) ×4 IMPLANT
SUT SILK 3 0 SH CR/8 (SUTURE) ×8 IMPLANT
SUT SILK 3 0 TIES 10X30 (SUTURE) ×4 IMPLANT
TOWEL OR 17X26 10 PK STRL BLUE (TOWEL DISPOSABLE) ×4 IMPLANT
TRAY FOLEY CATH 16FRSI W/METER (SET/KITS/TRAYS/PACK) IMPLANT
YANKAUER SUCT BULB TIP NO VENT (SUCTIONS) IMPLANT

## 2016-04-18 NOTE — Procedures (Signed)
Arterial Catheter Insertion Procedure Note Rosabell Geyer [id number] [birth year]  Procedure: Insertion of Arterial Catheter  Indications: Blood pressure monitoring and Frequent blood sampling  Procedure Details Consent: Unable to obtain consent because of altered level of consciousness. Time Out: Verified patient identification, verified procedure, site/side was marked, verified correct patient position, special equipment/implants available, medications/allergies/relevent history reviewed, required imaging and test results available.  Performed  Maximum sterile technique was used including antiseptics, cap, gloves, gown, hand hygiene, mask and sheet. Skin prep: Chlorhexidine; local anesthetic administered 20 gauge catheter was inserted into right radial artery using the Seldinger technique.  Evaluation Blood flow good; BP tracing good. Complications: No apparent complications.   Raylene Miyamoto. 05/18/2016   For OR, borderlien BP High risk re shock  [place]  Lavon Paganini. Titus Mould, MD, [place] Pgr: [facility]

## 2016-04-18 NOTE — Anesthesia Procedure Notes (Deleted)
Anesthesia Regional Block:  Femoral nerve block  Pre-Anesthetic Checklist: ,, timeout performed, Correct Patient, Correct Site, Correct Laterality, Correct Procedure, Correct Position, site marked, Risks and benefits discussed,  Surgical consent,  Pre-op evaluation,  At surgeon's request and post-op pain management   Prep: chloraprep       Needles:  Injection technique: Single-shot  Needle Type: Echogenic Stimulator Needle     Needle Length: 5cm 5 cm Needle Gauge: 22 and 22 G    Additional Needles:  Procedures: ultrasound guided (picture in chart) and nerve stimulator Femoral nerve block  Nerve Stimulator or Paresthesia:  Response: quadraceps contraction, 0.45 mA,   Additional Responses:   Narrative:  Injection made incrementally with aspirations every 5 mL.  Performed by: Personally  Anesthesiologist: Duane Boston  Additional Notes: Functioning IV was confirmed and monitors were applied.  A 36m 22ga Arrow echogenic stimulator needle was used. Sterile prep and drape,hand hygiene and sterile gloves were used. Ultrasound guidance: relevant anatomy identified, needle position confirmed, local anesthetic spread visualized around nerve(s)., vascular puncture avoided.  Image printed for medical record. Negative aspiration and negative test dose prior to incremental administration of local anesthetic. The patient tolerated the procedure well.

## 2016-04-18 NOTE — Progress Notes (Signed)
Remains stable Right leg incisions healing No further bleeding from abdomen Back to OR with general surgery today  Ruta Hinds, MD [facility] Office: [phone] Pager: [phone]

## 2016-04-18 NOTE — Transfer of Care (Signed)
Immediate Anesthesia Transfer of Care Note  Patient: Amy [name]  Procedure(s) Performed: Procedure(s): SMALL BOWEL RESECTION, ANASTOMSIS AND RE-APPLICATION OF WOUND VAC (N/A)  Patient Location: SICU  Anesthesia Type:General  Level of Consciousness: Patient remains intubated per anesthesia plan  Airway & Oxygen Therapy: Patient remains intubated per anesthesia plan and Patient placed on Ventilator (see vital sign flow sheet for setting)  Post-op Assessment: Report given to RN and Post -op Vital signs reviewed and stable  Post vital signs: Reviewed and stable  Last Vitals:  Vitals:   05/05/2016 1145 05/07/2016 1200  BP: (!) 89/43 (!) 89/78  Pulse: 98 92  Resp: 18 (!) 5  Temp:      Last Pain:  Vitals:   05/16/2016 0800  TempSrc: Oral  PainSc:       Patients Stated Pain Goal: 0 (46/04/79 9872)  Complications: No apparent anesthesia complications

## 2016-04-18 NOTE — Anesthesia Postprocedure Evaluation (Signed)
Anesthesia Post Note  Patient: Keyly Poth  Procedure(s) Performed: Procedure(s) (LRB): SMALL BOWEL RESECTION, ANASTOMSIS AND RE-APPLICATION OF WOUND VAC (N/A)  Patient location during evaluation: SICU Anesthesia Type: General Level of consciousness: sedated and patient remains intubated per anesthesia plan Pain management: pain level controlled Vital Signs Assessment: post-procedure vital signs reviewed and stable Respiratory status: patient remains intubated per anesthesia plan, respiratory function unstable and patient on ventilator - see flowsheet for VS Cardiovascular status: stable Anesthetic complications: no     Last Vitals:  Vitals:   05/06/2016 1900 04/23/2016 1939  BP: 104/66   Pulse: 79   Resp: 15   Temp:  36.5 C    Last Pain:  Vitals:   05/17/2016 1939  TempSrc: Oral  PainSc:    Pain Goal: Patients Stated Pain Goal: 0 (04/14/16 1955)               Catalina Gravel

## 2016-04-18 NOTE — Progress Notes (Signed)
PARENTERAL NUTRITION CONSULT NOTE - FOLLOW UP  Pharmacy Consult for TPN Indication: massive bowel resection  Patient Measurements: Height: '5\' 6"'$  (167.6 cm) Weight: 155 lb 10.3 oz (70.6 kg) IBW/kg (Calculated) : 59.3 Adjusted Body Weight: 63.9 kg   Vital Signs: Temp: 97.5 F (36.4 C) (08/01 0343) Temp Source: Axillary (08/01 0343) BP: 96/66 (08/01 0600) Pulse Rate: 65 (08/01 0700) Intake/Output from previous day: 07/31 0701 - 08/01 0700 In: 3045.3 [I.V.:1922.3; Blood:473; NG/GT:150; IV Piggyback:500] Out: 2060 [Urine:400; Emesis/NG output:700; Drains:2575] Intake/Output from this shift: No intake/output data recorded.   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 Plan back to OR likely Tues.  To start TPN for severe protein calorie malnutrition. Critically ill on fent, phenyephrine and vasopressin drips and getting FFP, PRBCs  GI: Post op day #2 of ex lap, artery bypass and lysis of adhesions. Plan for OR again today for re-ex lap and possible abdominal closure. Albumin low down to 1.8, prealbumin low at 15. Last BM was on 7/27. Endo: CBGs controlled 70-100s on SSI. Had a low of 26 yesterday. Pepcid in TPN Lytes: wnl today. CoCa 9.4. Has PRN replacements ordered for Mg and K. Renal: SCr stable, CrCl ~57m/min. UOP is low at 0.2 ml/kg/hr. Fluid off Pulm: Intubated with Fi02 of 40% Cards: BP soft but off pressors. Vaso and Neo stopped yesterday. Hepatobil: AST and ALT elevated but trending down. TBili elevated and up to 3.1 (no jaundice noted). Trig wnl. Neuro: On fentanyl at 3059m/hr. RASS -1 (goal -1) ID: On meropenem, vancomycin, and fluconazole. Afebrile, WBC overall down but starting to trend back up to 36.  Best Practices: SCDs,  famotidine TPN Access: PICC triple lumen TPN start date: 7/30 >>  Current Nutrition: NPO Clinimix E5/15 at 6029mr  Nutritional Goals: per RD recs on 7/31 Kcal: 1271 Protein: 105-115 g F/U with RD recs  Plan:  Increase Clinimix E 5/15 to 18m50m Hold 20% lipid emulsion for first 7 days for ICU patients per ASPEN guidelines (Start date 8/6) TPN provides 96 g of protein and 1363 kCals per day meeting > 90% of protein needs and > 100% of kcal needs Unable to meet 100% of patient needs due to hypocaloric goal Add MVI and TE in TPN Add famotidine '40mg'$  into TPN Continue sensitive SSI and adjust as needed Monitor TPN labs, daily Mg and Phos   NathElenor QuinonesarmD, BCPS Clinical Pharmacist Pager [phone] 7:27 AM

## 2016-04-18 NOTE — Anesthesia Preprocedure Evaluation (Signed)
Anesthesia Evaluation  Patient identified by MRN, date of birth, ID band Patient unresponsive  General Assessment Comment:Pt intubated.  Reviewed: Allergy & Precautions, NPO status , Patient's Chart, lab work & pertinent test results  Airway Mallampati: Intubated       Dental   Pulmonary sleep apnea , COPD,  Respiratory failure.  Intubated.   breath sounds clear to auscultation       Cardiovascular + Peripheral Vascular Disease  + dysrhythmias Atrial Fibrillation  Rhythm:regular Rate:Normal  hypotension   Neuro/Psych CVA    GI/Hepatic GERD  ,Recent SMA bypass with bowel resection   Endo/Other  Hypothyroidism   Renal/GU      Musculoskeletal   Abdominal   Peds  Hematology  (+) anemia ,   Anesthesia Other Findings   Reproductive/Obstetrics                             Anesthesia Physical Anesthesia Plan  ASA: IV  Anesthesia Plan: General   Post-op Pain Management:    Induction: Inhalational and Intravenous  Airway Management Planned: Oral ETT  Additional Equipment: Arterial line  Intra-op Plan:   Post-operative Plan: Post-operative intubation/ventilation  Informed Consent:   Plan Discussed with: CRNA, Anesthesiologist and Surgeon  Anesthesia Plan Comments:         Anesthesia Quick Evaluation

## 2016-04-18 NOTE — Op Note (Signed)
OPERATIVE REPORT  DATE OF OPERATION:  05/18/2016  PATIENT:  Amy [name]  75 y.o. female  PRE-OPERATIVE DIAGNOSIS: Open abdomen with bowel in discontinuity  POST-OPERATIVE DIAGNOSIS:  Exploratory laparotomy, small bowel resection, and small bowel anastomosis x 2, replacement of negative pressure wound dressing.  FINDINGS:  No frankly necrotic bowel.  Ischemic portion in the segment of small bowel just before the terminal ileum that we resected.  Vascular graft intact with palpable pulse.  PROCEDURE:  Procedure(s): SMALL BOWEL RESECTION, ANASTOMSIS AND RE-APPLICATION OF WOUND VAC  SURGEON:  Surgeon(s): Judeth Horn, MD  ASSISTANTGrandville Silos, M.D.  ANESTHESIA:   general  COMPLICATIONS:  None  EBL: <50 ml  BLOOD ADMINISTERED: none  DRAINS: Nasogastric Tube, Urinary Catheter (Foley) and Negative pressure wound dressing.   SPECIMEN:  Source of Specimen:  resected small bowel  COUNTS CORRECT:  YES  PROCEDURE DETAILS: The patient was taken to the operating room directly from the intensive care unit with an open abdomen negative pressure wound dressing in place. She had previously been left open with bowel in discontinuity because of previous ischemia and infarction. She was being brought back today for inspection and possible closure and reanastomosis of her small bowel.  Once she returned to the operating room she was placed on the table in supine position. The outer portion of her negative pressure wound dressing was removed leaving the inner layer intact for prepping.  A proper timeout was performed identifying the patient and procedure to be performed. We removed the inner portion of the negative pressure wound dressing uncovering loops of small bowel in several areas of clotted blood in the upper abdomen just above the liver, in the pelvic area, and throughout the remaining small bowel. Those no evidence of active bleeding.  We were able to visualize the patient's venous graft to  the superior mesenteric artery traveling up the mesentery on the left side worse of fourth portion of the duodenum. It was intact and had a palpable pulse. We ran the small bowel from the ligament of Treitz down to the first piece of bowel in discontinuity which a been attached to the following segment with a simple silk suture. We ran the segment of bowel the second piece down to its second point of discontinuity just before the terminal portion of the ileum. It was in this central segment that a 6 inch piece of bowel appeared to have some full-thickness areas of ischemia requiring resection with a GIA-75 stapler and subsequently a LigaSure device on the mesentery.  An anastomosis was made distally between the terminal ileum and the distal ileum using a GIA-75 stapler and a TA 60 stapler across the expected remaining enterotomy. The more proximal jejunal to ileum anastomosis was made in the same manner. Because of the tenuousness of the anastomosis distally this suture line was oversewn using interrupted 2-0 silk sutures. No mesenteric closures were performed.  Once the anastomoses were complete, we irrigated with saline solution then reapplied the negative pressure wound dressing. All counts were correct including needles, sponges, and instruments. The patient was taken directly back to the intensive care unit in critical condition.  I spoke with the patient's family postoperatively leading them know that her able to put the bowel back together however was on as to whether not this will stay intact. She will be taken back to the operating room in 48 hours for hopefully the last look and possible closure of her abdominal wall.  PATIENT DISPOSITION:  ICU - intubated and  critically ill.   Parsa Rickett 8/1/20172:08 PM

## 2016-04-18 NOTE — Progress Notes (Signed)
PULMONARY / CRITICAL CARE MEDICINE   Name: Amy [name] MRN: [id number] DOB: [birth year]    ADMISSION DATE:  04/17/2016 CONSULTATION DATE:  7/23  REFERRING MD:  [facility] per family request   CHIEF COMPLAINT:  Acute encephalopathy and progressive respiratory failure   HISTORY OF PRESENT ILLNESS:   This is a 75 year old white female who was initially admitted to [facility] hospital where she underwent an elective loop colostomy takedown on 7/18. Her immediate post-op course was c/b lethargy and actually required narcan in PACU. She never really improved w/ hospital notes continuing to raise concern for altered mental status and difficulty to arouse. She ultimately required emergent intubation for hypercarbia (CO2 52) and inability to protect airway. She was transferred per family request on 7/23 for further evaluation and care. MRI confirmed new CVA. Suspect this was multifactorial: CVA, meds, +/- pneumonia. She was extubated on 7/24 and transferred to the SDU. On 7/28, she developed worsening abdominal pain and leukocytosis. 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 On 7/29, she underwent aorto to superior mesenteric artery bypass with greater saphenous vein graft and small bowel resection. In the immediate post-op period, she became hypotensive to the 50s and was started on pressors. She was transferred back to [facility] service.  SUBJECTIVE:  Plan to OR today at 11 am, off vasopressin  VITAL SIGNS: BP 96/66   Pulse 77   Temp 97.5 F (36.4 C) (Oral)   Resp 14   Ht '5\' 6"'$  (1.676 m)   Wt 70.6 kg (155 lb 10.3 oz)   SpO2 99%   BMI 25.12 kg/m   HEMODYNAMICS: CVP:  [0 mmHg-12 mmHg] 0 mmHg  VENTILATOR SETTINGS: Vent Mode: PRVC FiO2 (%):  [40 %] 40 % Set Rate:  [14 bmp] 14 bmp Vt Set:  [470 mL] 470 mL PEEP:  [5 cmH20] 5 cmH20 Pressure Support:  [5 cmH20] 5 cmH20 Plateau Pressure:  [20 cmH20-21 cmH20] 20 cmH20  INTAKE / OUTPUT: I/O last 3  completed shifts: In: 5387.9 [I.V.:3337.9; Blood:1010; NG/GT:240; IV Piggyback:800] Out: 4081 [Urine:525; Emesis/NG output:900; Drains:4250]  PHYSICAL EXAMINATION: General: Sedated. No acute distress. No family at bedside.  Integument:  Warm & dry. Abdominal wound VAC in place, small hematoma inferior HEENT:  Endotracheal tube in place. Line wnl Cardiovascular:  s1 s2 Regular rate. Normal S1 & S2. No appreciable JVD.  Pulmonary:  Coarse  diffuse Abdomen: Soft. nobowel sounds. Nondistended. , vac with small hematoma Neurological: Sedated. Pupils symmetric., NOT fc   LABS:  BMET  Recent Labs Lab 04/02/2016 0400 04/17/16 0200 05/07/2016 0330  NA 145 144 144  K 3.2* 3.7 4.3  CL 118* 115* 115*  CO2 '24 26 26  '$ BUN 21* 22* 28*  CREATININE 0.96 0.84 0.96  GLUCOSE 128* 122* 116*    Electrolytes  Recent Labs Lab 03/29/2016 0400 04/17/16 0200 05/06/2016 0330  CALCIUM 8.1* 7.5* 7.7*  MG 1.8 1.9 2.0  PHOS 2.9 2.9 3.1    CBC  Recent Labs Lab 04/17/16 1128 04/17/16 2100 05/15/2016 0330  WBC 33.8* 37.0* 36.0*  HGB 9.2* 9.7* 9.4*  HCT 27.5* 29.1* 28.3*  PLT 66* 63* 71*    Coag's  Recent Labs Lab 03/22/2016 1644 04/17/16 0200 04/17/16 2100 05/02/2016 0330  APTT 33 34  --  31  INR 1.60 1.71 1.36 1.37    Sepsis Markers  Recent Labs Lab 04/07/2016 1455 04/03/2016 1814 04/17/16 1653  LATICACIDVEN 7.6* 4.3* 1.3    ABG  Recent Labs Lab 04/17/2016 2210 04/02/2016 0410 05/09/2016 0342  PHART 7.516* 7.366 7.372  PCO2ART 23.8* 40.4 52.2*  PO2ART 125.0* 110.0* 132.0*    Liver Enzymes  Recent Labs Lab 04/17/2016 0400 04/17/16 0200 04/27/2016 0330  AST 676* 239* 183*  ALT 187* 108* 107*  ALKPHOS 65 71 95  BILITOT 2.9* 2.9* 3.1*  ALBUMIN 2.5* 2.1* 1.8*    Cardiac Enzymes No results for input(s): TROPONINI, PROBNP in the last 168 hours.  Glucose  Recent Labs Lab 04/17/16 1315 04/17/16 1431 04/17/16 1655 04/17/16 1936 04/17/16 2332 04/20/2016 0338  GLUCAP 82 79  100* 102* 106* 115*    Imaging Dg Chest Port 1 View  Result Date: 04/29/2016 CLINICAL DATA:  Ventilator dependent respiratory failure. Ischemic bowel. Followup bibasilar atelectasis and bilateral pleural effusions. EXAM: PORTABLE CHEST 1 VIEW COMPARISON:  03/19/2016, 04/17/2016 and earlier. FINDINGS: Endotracheal tube tip in satisfactory position projecting approximately 5 cm above carina. Left arm PICC tip projects over the upper SVC. Right jugular central venous catheter tip projects over the lower SVC, unchanged. Nasogastric tube courses below the diaphragm into the stomach. Small to moderate-sized left pleural effusion, unchanged. Small right pleural effusion, improved since yesterday. Dense consolidation in the left lower lobe, unchanged. Mild atelectasis in the right lower lobe, unchanged. No new pulmonary parenchymal abnormality. IMPRESSION: 1.  Support apparatus satisfactory. 2. Small to moderate size left pleural effusion, unchanged. Small right pleural effusion, slightly improved since yesterday. 3. Dense passive atelectasis and/or pneumonia involving the left lower lobe, unchanged. Mild right basilar atelectasis, unchanged. 4. No new abnormalities. Electronically Signed   By: Evangeline Dakin M.D.   On: 05/16/2016 08:00     STUDIES:  CT head (at [facility]): negative  MRI brain  7/23: Bilateral cerebral hemispheres and tight cerebellar hemisphere patchy acute and early subacute infarcts. No mass effect or hemorrhage. Central thromboembolic source is suspected.  EEG 7/23: Abnormal due to moderate diffuse slowing of the waking background. No seizure or epiletiform discharges.  TTE 7/24: EF 50-55%, lateral wall appears hypokinetic, systolic function normal, mild MR regurg Carotid US 7/24: right mild soft plaque CCA, mild to moderate plaque origin and proximal ICA and ECA 1-39% ICA stenosis.  Port CXR 7/30: ETT in good position. Enteric feeding tube coursing below diaphragm. Right internal  jugular central venous catheter in good position. Bilateral lower lung opacity versus effusion unchanged.   MICROBIOLOGY: MRSA PCR 7/23:  Positive  Urine Ctx 7/23:  Negative  Blood Ctx x2 7/23:  Negative  Tracheal Asp Ctx 7/23:  MRSA Blood Ctx x2 7/28>>> Urine Ctx 7/28:  Negative  ANTIBIOTICS: Vancomycin 7/23 >> Meropenem 7/23 >> Diflucan 7/27 >>   SIGNIFICANT EVENTS: Loop colostomy takedown 7/18 7/18-7/22: progressively lethargic  7/23 [facility] to [facility] s/p getting intubated for AMS and hypercarbia. Surgical service consulted at [facility] on arrival  7/24 on CCB gtt for af. But fully awake and following commands. Extubated. 7/25 SLP evaluation for diet, ASA started, FEES 7/28 developed worsening abdominal pain, leukocytosis, CT showing complete SMA occlusion 7/29 Underwent aorto to SMA bypass with greater saphenous vein graft and small bowel resection 7/29 Developed hypotension to the 50s in post-op period, started on pressors 7/29 Code status changed to DNR 7/30 Ex Lap by CCS w/ necrotic bowel & ischemia s/p resection w/ intact bypass & oozing over peritoneal surfaces.  LINES/TUBES: OETT 7/23>>>7/24; 7/29 >> R IJ CVL 7/19 >> L Radial Art Line 7/29 >>  FOLEY 7/29 >> R NGT 7/29 >> IABP 7/29 >>?  ASSESSMENT / PLAN:  PULMONARY A: Acute Hypercarbic Respiratory Failure Acute Hypoxic Respiratory Failure MRSA Pneumonia H/O COPD H/O OSA (intolerant of CPAP) H/O RUL Lobectomy - For NSCLC. At risk TRALI P:   Full vent support Pulmicort Neb bid Change Duoneb to q6hr Failed SBT 7/31, post op can re attempt if awake No extubation with re look planned Would repeat pcxr for increasing LLL hazziness frmo baseline  Rising effusion Would favor neg balance Wondering if she will need trach Friday if not progressing and pending trips  CARDIOVASCULAR A:  Shock - Septic vs Hypovolemic. Atrial Fibrillation Elevated Troponin I - Likely demand ischemia. 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 Bowel Ischemia - S/P resection 7/29 & further resection 7/30. Shock Liver H/O GERD  P:   NPO TPN Pepcid IV q12hr Further Management per CCS- re look today  RENAL A:   Hypokalemia - Replaced. Goodman - Resolving. Likely due to hyperchloremia. Hyperchloremia - Stable.  P:   No saline with hyperchloremia Keep d51/2 NS likely will get lasix in am for even balance to slight neg again tpn may cause hypernatremia with standard regimen  HEMATOLOGIC A:   DIC/Coagulopathy - S/P Massive Transfusion. S/P 8u FFP 7/30 & 8u FFP 7/29. Anemia - Secondary to acute blood loss. S/P 4u PRBC 7/30 & 8u PRBC 7/29. Leukocytosis - Secondary to Sepsis / SIRS Thrombocytopenia - Secondary to consumption w/ DIC. S/P 1u Platelets this AM. 2u Platelets 7/29. Embolic CVA  P:  We need to stop the cycle of continued products and affect of diltuion, did well with this approach thus far No ffp planned today Plat rise noted, no plat Tx planned Cbc Seriel q12h scd coags wnl  INFECTIOUS A:   Sepsis MRSA Pneumonia- possible  P:   vanc for resp culture, will need 10 days Consider IMipenem narrow to ceftriaxone Consider dc diflucan after relook for perf risk  ENDOCRINE A:   H/O Hypothyroidism (TSH 28.2) Hypoglycemia, borderline at times  P:   Switch to Levothyroxine 33mg IV daily Continuing D5 1/2NS to kvo Continuing Accu-Checks q4hr SSI limit  NEUROLOGIC A:   Sedation on Ventilator Post-operative Pain 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   P:   Neurology following for stroke Goal RASS: -2 Fentanyl gtt & IV prn Pain Versed IV prn Sedation - would like to avoid this Re assess with wua post op  FAMILY UPDATE: No family at bedside 7/30.   Ccm time 35 min  DLavon Paganini FTitus Mould MD,  FNorfolkPgr: 3[facility]stadtPulmonary & Critical Care

## 2016-04-18 NOTE — Progress Notes (Signed)
Labs are back.  WBC 36K.  INR 1.69.  Lactic acid level 1.3.  Okay to go back to the OR for re-evaluation  [name]. Dahlia Bailiff, MD, [place] [phone] [facility]

## 2016-04-18 NOTE — Transfer of Care (Deleted)
Immediate Anesthesia Transfer of Care Note  Patient: Amy [name]  Procedure(s) Performed: Procedure(s): ABDOMINAL RE-EXPLORATION , POSSIBLE CLOSURE (N/A)  Patient Location: PACU  Anesthesia Type:General  Level of Consciousness: awake, alert , oriented and patient cooperative  Airway & Oxygen Therapy: Patient Spontanous Breathing and Patient connected to nasal cannula oxygen  Post-op Assessment: Report given to RN and Post -op Vital signs reviewed and stable  Post vital signs: Reviewed and stable  Last Vitals:  Vitals:   04/22/2016 1000 04/20/2016 1145  BP: (!) 89/43 (!) 89/43  Pulse: 94 98  Resp: (!) 21 18  Temp:      Last Pain:  Vitals:   04/20/2016 0800  TempSrc: Oral  PainSc:       Patients Stated Pain Goal: 0 (06/16/56 4734)  Complications: No apparent anesthesia complications

## 2016-04-18 DEATH — deceased

## 2016-04-19 ENCOUNTER — Inpatient Hospital Stay (HOSPITAL_COMMUNITY): Payer: Medicare Other

## 2016-04-19 ENCOUNTER — Encounter (HOSPITAL_COMMUNITY): Payer: Self-pay | Admitting: General Surgery

## 2016-04-19 LAB — CBC WITH DIFFERENTIAL/PLATELET
BASOS ABS: 0 10*3/uL (ref 0.0–0.1)
Basophils Relative: 0 %
Eosinophils Absolute: 1.6 10*3/uL — ABNORMAL HIGH (ref 0.0–0.7)
Eosinophils Relative: 5 %
HEMATOCRIT: 29.2 % — AB (ref 36.0–46.0)
Hemoglobin: 9.4 g/dL — ABNORMAL LOW (ref 12.0–15.0)
LYMPHS ABS: 1.4 10*3/uL (ref 0.7–4.0)
LYMPHS PCT: 4 %
MCH: 29.7 pg (ref 26.0–34.0)
MCHC: 32.2 g/dL (ref 30.0–36.0)
MCV: 92.1 fL (ref 78.0–100.0)
MONO ABS: 1.3 10*3/uL — AB (ref 0.1–1.0)
Monocytes Relative: 4 %
NEUTROS ABS: 30.1 10*3/uL — AB (ref 1.7–7.7)
Neutrophils Relative %: 88 %
Platelets: 70 10*3/uL — ABNORMAL LOW (ref 150–400)
RBC: 3.17 MIL/uL — AB (ref 3.87–5.11)
RDW: 17 % — AB (ref 11.5–15.5)
WBC: 34.4 10*3/uL — ABNORMAL HIGH (ref 4.0–10.5)

## 2016-04-19 LAB — APTT: aPTT: 34 seconds (ref 24–36)

## 2016-04-19 LAB — GLUCOSE, CAPILLARY
GLUCOSE-CAPILLARY: 121 mg/dL — AB (ref 65–99)
GLUCOSE-CAPILLARY: 125 mg/dL — AB (ref 65–99)
Glucose-Capillary: 118 mg/dL — ABNORMAL HIGH (ref 65–99)
Glucose-Capillary: 112 mg/dL — ABNORMAL HIGH (ref 65–99)
Glucose-Capillary: 99 mg/dL (ref 65–99)

## 2016-04-19 LAB — COMPREHENSIVE METABOLIC PANEL
ALBUMIN: 1.3 g/dL — AB (ref 3.5–5.0)
ALT: 79 U/L — ABNORMAL HIGH (ref 14–54)
ANION GAP: 3 — AB (ref 5–15)
AST: 113 U/L — ABNORMAL HIGH (ref 15–41)
Alkaline Phosphatase: 98 U/L (ref 38–126)
BUN: 41 mg/dL — ABNORMAL HIGH (ref 6–20)
CHLORIDE: 114 mmol/L — AB (ref 101–111)
CO2: 25 mmol/L (ref 22–32)
Calcium: 7.6 mg/dL — ABNORMAL LOW (ref 8.9–10.3)
Creatinine, Ser: 0.95 mg/dL (ref 0.44–1.00)
GFR calc non Af Amer: 56 mL/min — ABNORMAL LOW (ref >60)
Glucose, Bld: 117 mg/dL — ABNORMAL HIGH (ref 65–99)
POTASSIUM: 4.6 mmol/L (ref 3.5–5.1)
SODIUM: 142 mmol/L (ref 135–145)
Total Bilirubin: 3 mg/dL — ABNORMAL HIGH (ref 0.3–1.2)
Total Protein: 3.2 g/dL — ABNORMAL LOW (ref 6.5–8.1)

## 2016-04-19 LAB — CULTURE, BLOOD (ROUTINE X 2)
CULTURE: NO GROWTH
CULTURE: NO GROWTH

## 2016-04-19 LAB — PROTIME-INR
INR: 1.32
Prothrombin Time: 16.5 seconds — ABNORMAL HIGH (ref 11.4–15.2)

## 2016-04-19 LAB — TYPE AND SCREEN
ABO/RH(D): O POS
Antibody Screen: NEGATIVE
DONOR AG TYPE: NEGATIVE
Donor AG Type: NEGATIVE
Unit division: 0
Unit division: 0

## 2016-04-19 LAB — MAGNESIUM: Magnesium: 1.9 mg/dL (ref 1.7–2.4)

## 2016-04-19 LAB — POCT I-STAT 3, ART BLOOD GAS (G3+)
Bicarbonate: 26.1 mEq/L — ABNORMAL HIGH (ref 20.0–24.0)
O2 SAT: 99 %
PCO2 ART: 48.1 mmHg — AB (ref 35.0–45.0)
PH ART: 7.341 — AB (ref 7.350–7.450)
Patient temperature: 98.2
TCO2: 28 mmol/L (ref 0–100)
pO2, Arterial: 135 mmHg — ABNORMAL HIGH (ref 80.0–100.0)

## 2016-04-19 LAB — PHOSPHORUS: PHOSPHORUS: 3.2 mg/dL (ref 2.5–4.6)

## 2016-04-19 LAB — PREPARE RBC (CROSSMATCH)

## 2016-04-19 LAB — FIBRINOGEN: FIBRINOGEN: 435 mg/dL (ref 210–475)

## 2016-04-19 MED ORDER — ALBUTEROL SULFATE (2.5 MG/3ML) 0.083% IN NEBU
2.5000 mg | INHALATION_SOLUTION | RESPIRATORY_TRACT | Status: DC | PRN
  Administered 2016-04-29: 2.5 mg via RESPIRATORY_TRACT

## 2016-04-19 MED ORDER — MIDAZOLAM HCL 2 MG/2ML IJ SOLN
2.0000 mg | INTRAMUSCULAR | Status: DC | PRN
  Administered 2016-04-19 – 2016-04-20 (×3): 2 mg via INTRAVENOUS
  Administered 2016-04-20 (×2): 1 mg via INTRAVENOUS
  Administered 2016-04-20 – 2016-04-21 (×3): 2 mg via INTRAVENOUS
  Filled 2016-04-19 (×11): qty 2

## 2016-04-19 MED ORDER — TRACE MINERALS CR-CU-MN-SE-ZN 10-1000-500-60 MCG/ML IV SOLN
INTRAVENOUS | Status: AC
  Administered 2016-04-19: 17:00:00 via INTRAVENOUS
  Filled 2016-04-19: qty 1920

## 2016-04-19 MED ORDER — FUROSEMIDE 10 MG/ML IJ SOLN
20.0000 mg | Freq: Once | INTRAMUSCULAR | Status: AC
  Administered 2016-04-19: 20 mg via INTRAVENOUS
  Filled 2016-04-19: qty 2

## 2016-04-19 NOTE — Progress Notes (Addendum)
PULMONARY / CRITICAL CARE MEDICINE   Name: Amy [name] MRN: [id number] DOB: 11/05/1940    ADMISSION DATE:  03/23/2016 CONSULTATION DATE:  7/23  REFERRING MD:  [facility] per family request   CHIEF COMPLAINT:  Acute encephalopathy and progressive respiratory failure   HISTORY OF PRESENT ILLNESS:   This is a 75 year old white female who was initially admitted to [facility] hospital where she underwent an elective loop colostomy takedown on 7/18. Her immediate post-op course was c/b lethargy and actually required narcan in PACU. She never really improved w/ hospital notes continuing to raise concern for altered mental status and difficulty to arouse. She ultimately required emergent intubation for hypercarbia (CO2 52) and inability to protect airway. She was transferred per family request on 7/23 for further evaluation and care. MRI confirmed new CVA. Suspect this was multifactorial: CVA, meds, +/- pneumonia. She was extubated on 7/24 and transferred to the SDU. On 7/28, she developed worsening abdominal pain and leukocytosis. 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 On 7/29, she underwent aorto to superior mesenteric artery bypass with greater saphenous vein graft and small bowel resection. In the immediate post-op period, she became hypotensive to the 50s and was started on pressors. She was transferred back to [facility] service.  SUBJECTIVE:  OR yesterday - small bowel rsxn, anastamosis, re-application of wound vac, plan for OR again tomorrow to reassess bowel, remains sedated on ventilator, less agitation  VITAL SIGNS: BP (!) 143/48   Pulse 97   Temp 98 F (36.7 C) (Axillary)   Resp 15   Ht '5\' 6"'$  (1.676 m)   Wt 153 lb 7 oz (69.6 kg)   SpO2 100%   BMI 24.77 kg/m   HEMODYNAMICS: CVP:  [0 mmHg-11 mmHg] 5 mmHg  VENTILATOR SETTINGS: Vent Mode: PRVC FiO2 (%):  [40 %] 40 % Set Rate:  [14 bmp] 14 bmp Vt Set:  [470 mL-480 mL] 480 mL PEEP:  [5  cmH20] 5 cmH20 Plateau Pressure:  [17 cmH20-20 cmH20] 19 cmH20  INTAKE / OUTPUT: I/O last 3 completed shifts: In: 4516.4 [I.V.:3856.4; Other:30; NG/GT:180; IV Piggyback:450] Out: 6578 [Urine:930; Emesis/NG output:900; Drains:3175; Other:200; Blood:200]  PHYSICAL EXAMINATION: General: Sedated. No acute distress. Family at bedside.  Integument:  Warm & dry. Abdominal wound VAC in place, small hematoma inferior HEENT:  Endotracheal tube in place. Line wnl Cardiovascular: Regular rate, normal S1 & S2. No appreciable JVD.  Pulmonary:  Coarse  diffuse Abdomen: Soft. nobowel sounds. Nondistended. , vac with small hematoma Neurological: Sedated. Pupils symmetric., NOT fc   LABS:  BMET  Recent Labs Lab 04/17/16 0200 05/06/2016 0330 04/19/16 0430  NA 144 144 142  K 3.7 4.3 4.6  CL 115* 115* 114*  CO2 '26 26 25  '$ BUN 22* 28* 41*  CREATININE 0.84 0.96 0.95  GLUCOSE 122* 116* 117*    Electrolytes  Recent Labs Lab 04/17/16 0200 04/20/2016 0330 04/19/16 0430  CALCIUM 7.5* 7.7* 7.6*  MG 1.9 2.0 1.9  PHOS 2.9 3.1 3.2    CBC  Recent Labs Lab 05/05/2016 1140 05/12/2016 2011 04/19/16 0430  WBC 35.3* 34.3* 34.4*  HGB 9.2* 9.8* 9.4*  HCT 28.4* 30.0* 29.2*  PLT 63* 58* 70*    Coag's  Recent Labs Lab 04/17/16 0200 04/17/16 2100 05/15/2016 0330 04/19/16 0430  APTT 34  --  31 34  INR 1.71 1.36 1.37 1.32    Sepsis Markers  Recent Labs Lab 04/13/2016 1455 03/19/2016 1814 04/17/16 1653  LATICACIDVEN 7.6* 4.3* 1.3    ABG  Recent Labs Lab 03/27/2016 0410 04/24/2016 0342 04/19/16 0432  PHART 7.366 7.372 7.341*  PCO2ART 40.4 52.2* 48.1*  PO2ART 110.0* 132.0* 135.0*    Liver Enzymes  Recent Labs Lab 04/17/16 0200 04/20/2016 0330 04/19/16 0430  AST 239* 183* 113*  ALT 108* 107* 79*  ALKPHOS 71 95 98  BILITOT 2.9* 3.1* 3.0*  ALBUMIN 2.1* 1.8* 1.3*    Cardiac Enzymes No results for input(s): TROPONINI, PROBNP in the last 168 hours.  Glucose  Recent Labs Lab  04/23/2016 0338 05/18/2016 0812 05/07/2016 1635 05/17/2016 1931 04/27/2016 2344 04/19/16 0357  GLUCAP 115* 105* 102* 144* 121* 118*    Imaging Dg Chest Port 1 View  Result Date: 04/19/2016 CLINICAL DATA:  Intubated patient, history of previous right lobectomy for malignancy. EXAM: PORTABLE CHEST 1 VIEW COMPARISON:  Portable chest x-ray of April 18, 2016 FINDINGS: The patient is quite rotated toward the right. The left lung is well-expanded. There is layering of pleural fluid posteriorly and inferiorly. On the right there is mild stable volume loss. There is no pneumothorax. Surgical suture material is present in the upper hemi thorax. A small amount of pleural fluid at the right lung base is suspected. The heart is top-normal in size. The pulmonary vascularity is not engorged. The endotracheal tube tip lies approximately 4.2 cm above the carina. The esophagogastric tube tip projects below the inferior margin of the image. The right internal jugular venous catheter tip projects over the midportion of the SVC. The left PICC line tip projects just inferior to this. IMPRESSION: Allowing for rotation the appearance of the chest has not significantly changed since the previous study. Left greater than right pleural effusions. Mild bibasilar atelectasis. The support tubes are in reasonable position. Electronically Signed   By: David  Martinique M.D.   On: 04/19/2016 07:28   STUDIES:  CT head (at [facility]): negative  MRI brain  7/23: Bilateral cerebral hemispheres and tight cerebellar hemisphere patchy acute and early subacute infarcts. No mass effect or hemorrhage. Central thromboembolic source is suspected.  EEG 7/23: Abnormal due to moderate diffuse slowing of the waking background. No seizure or epiletiform discharges.  TTE 7/24: EF 50-55%, lateral wall appears hypokinetic, systolic function normal, mild MR regurg Carotid US 7/24: right mild soft plaque CCA, mild to moderate plaque origin and proximal ICA and ECA  1-39% ICA stenosis.  Port CXR 7/30: ETT in good position. Enteric feeding tube coursing below diaphragm. Right internal jugular central venous catheter in good position. Bilateral lower lung opacity versus effusion unchanged.  Port CXR 7/31: ETT in position, L pleural effusion unchanged, small R pleural effus improved, dense passive atelectasis/pneumonia of LLL unchanged Port CXR 8/1: No change  MICROBIOLOGY: MRSA PCR 7/23:  Positive  Urine Ctx 7/23:  Negative  Blood Ctx x2 7/23:  Negative  Tracheal Asp Ctx 7/23:  MRSA Blood Ctx x2 7/28>>> NGTD Urine Ctx 7/28:  Negative  ANTIBIOTICS: Vancomycin 7/23 >>>10 days  Meropenem 7/23 >> 8/1 Diflucan 7/27 >>>8/2 Ceftriaxone 8/1 >>>  SIGNIFICANT EVENTS: Loop colostomy takedown 7/18 7/18-7/22: progressively lethargic  7/23 [facility]/p getting intubated for AMS and hypercarbia. Surgical service consulted at [facility] on arrival  7/24 on CCB gtt for af. But fully awake and following commands. Extubated. 7/25 SLP evaluation for diet, ASA started, FEES 7/28 developed worsening abdominal pain, leukocytosis, CT showing complete SMA occlusion 7/29 Underwent aorto to SMA bypass with greater saphenous vein graft and small bowel resection  7/29 Developed hypotension to the 50s in post-op period, started on pressors 7/29 Code status changed to DNR 7/30 Ex Lap by CCS w/ necrotic bowel & ischemia s/p resection w/ intact bypass & oozing over peritoneal surfaces. 8/1 OR, limited small bowel resection, anastamosis x2, vascular graft intact  LINES/TUBES: OETT 7/23>>>7/24; 7/29 >> R IJ CVL 7/19 >> L Radial Art Line 7/29 >> 8/1 patient pulled R Radial Art Line 8/1 >> FOLEY 7/29 >> R NGT 7/29 >> IABP 7/29 >>?  ASSESSMENT / PLAN:  PULMONARY A: Acute Hypercarbic Respiratory Failure Acute Hypoxic Respiratory Failure MRSA Pneumonia H/O COPD H/O OSA (intolerant of CPAP) H/O RUL Lobectomy - For NSCLC. At risk TRALI P:   Full vent  support Pulmicort Neb bid and Duoneb to q6hr Failed SBT 7/31, post op can re attempt if awake No extubation with re look planned pCXR stable, Left base effusion Would favor neg balance May need trach Friday if not progressing and pending trips, will d/w gen Surgery Dc sbt with open abdo  CARDIOVASCULAR A:  Shock - Septic vs Hypovolemic. Atrial Fibrillation Elevated Troponin I - Likely demand ischemia. 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 Bowel Ischemia - S/P resection 7/29 & further resection 7/30. Shock Liver H/O GERD  P:   NPO TPN Pepcid IV q12hr Re-look tomorrow per CCS  RENAL A:   Hypokalemia - Replaced NAGMA - Resolving. Likely due to hyperchloremia. Hyperchloremia - Stable.  P:   No saline with hyperchloremia Keep d5 1/2 NS May require intermittent lasix for even balance, slight neg again tpn may cause hypernatremia with standard regimen  HEMATOLOGIC A:   DIC/Coagulopathy - S/P Massive Transfusion. S/P 8u FFP 7/30 & 8u FFP 7/29. Anemia - Secondary to acute blood loss. S/P 4u PRBC 7/30 & 8u PRBC 7/29. Leukocytosis - Secondary to Sepsis / SIRS Thrombocytopenia - Secondary to consumption w/ DIC. S/P net 3u plts Embolic CVA  P:  Cbc in am  SCD Coags wnl  INFECTIOUS A:   Sepsis MRSA Pneumonia- possible  P:   Vanc for resp culture, day 10 today Narrowed antibiotics to Ceftiaxone yesterday (dc'd Imipenem yday) Consider dc diflucan  ENDOCRINE A:   H/O Hypothyroidism (TSH 28.2) Hypoglycemia, borderline at times  P:   Switch to Levothyroxine 3mg IV daily Continuing D5 1/2NS to kvo Continuing Accu-Checks q4hr SSI limit  NEUROLOGIC A:   Sedation on Ventilator Post-operative Pain 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   P:    Goal RASS: -2 Fentanyl gtt & IV prn Pain Versed IV prn Sedation - would like to avoid this Re assess with wua post op  FAMILY UPDATE: Husband at bedside and updated a 830AM  Ccm time 35 min  AAsencion Partridge MD [phone] STAFF NOTE: I, DMerrie Roof MD FACP have personally reviewed patient's available data, including medical history, events of note, physical examination and test results as part of my evaluation. I have discussed with resident/NP and other care providers such as pharmacist, RN and RRT. In addition, I personally evaluated patient and elicited key findings of: sedated, int agitation, off neo, abdo soft re look noted, would hold off sbt with open abdo, only risk until abdo closure, abg reviewed, could slight increase rate, O2 needs low, pcxr with residual effusion, consider lasix  to 500 cc neg balance next 24 hours, cbc to daily, dc diflucan no perf, allow vanc to dc at day 10, continued ceftriaxone for now, updated duaghter in room, will see what relook shows and discuss trach if outlook is reasonable, could do perc at bedside Friday if indicated  The patient is critically ill with multiple organ systems failure and requires high complexity decision making for assessment and support, frequent evaluation and titration of therapies, application of advanced monitoring technologies and extensive interpretation of multiple databases.   Critical Care Time devoted to patient care services described in this note is 30 Minutes. This time reflects time of care of this signee: Merrie Roof, MD FACP. This critical care time does not reflect procedure time, or teaching time or supervisory time of PA/NP/Med student/Med Resident etc but could involve care discussion time. Rest per NP/medical resident whose note is outlined above and that I agree with   Lavon Paganini. Titus Mould, MD, [place] Pgr: [facility] 04/19/2016 9:35 AM

## 2016-04-19 NOTE — Progress Notes (Signed)
Bedside EEG completed, results pending. 

## 2016-04-19 NOTE — Progress Notes (Signed)
Nutrition Follow Up  DOCUMENTATION CODES:   Not applicable  INTERVENTION:    TPN per pharmacy  NUTRITION DIAGNOSIS:   Inadequate oral intake related to inability to eat as evidenced by NPO status, ongoing  GOAL:   Patient will meet greater than or equal to 90% of their needs, met  MONITOR:   Vent status, Labs, Weight trends, Skin, I & O's, TPN prescription   ASSESSMENT:   75 y.o. Female who presented for evaluation of abdominal pain x 1 day. 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 Her KUB was suggestive of an ileus. She is s/p elective colostomy takedown at [facility] on 04/04/16. Her post-op course was complicated altered mental status, lethargy and respiratory distress requiring emergent intubation. She was transferred to [facility] on 04/13/2016 per family's request.    Patient s/p procedure 7/30: EXPLORATORY LAPAROTOMY SMALL BOWEL RESECTION x 2 PLACEMENT OF ABDOMINAL WOUND VAC  Patient is currently intubated on ventilator support >> NGT to LIS MV: 6.4 L/min Temp (24hrs), Avg:97.3 F (36.3 C), Min:96 F (35.6 C), Max:98.2 F (36.8 C)   Pt transferred from 3S-Stepdown to 2S-SICU post-op Prior to surgery, pt was on a Dys 2-nectar thick liquid diet. S/p small bowel resection, anastomosis and re-application of wound VAC 8/1. Possible abdominal closure tomorrow, 8/3.  Patient is receiving TPN via PICC line with Clinimix E 5/15 @ 80 ml/hr.  No lipids at this time.  Provides 1363 kcal and 96 grams protein per day. Meets 100% minimum estimated energy needs and 91% minimum estimated protein needs.  Diet Order:  Diet NPO time specified .TPN (CLINIMIX-E) Adult .TPN (CLINIMIX-E) Adult  Skin:   NPWT to abdomen  Last BM:  7/27  Height:   Ht Readings from Last 1 Encounters:  04/14/2016 5' 6"  (1.676 m)    Weight:   Wt Readings from Last 1 Encounters:  04/19/16 153 lb 7 oz (69.6 kg)    Ideal Body Weight:  59 kg  BMI:  Body  mass index is 24.77 kg/m.  Estimated Nutritional Needs:   Kcal:  1287  Protein:  105-115 gm  Fluid:  per MD  EDUCATION NEEDS:   No education needs identified at this time  Arthur Holms, RD, LDN Pager #: [phone] After-Hours Pager #: [phone]

## 2016-04-19 NOTE — Procedures (Signed)
Electroencephalogram (EEG) Report  Date of study: 04/19/16  Requesting clinician: Asencion Partridge, MD  Reason for study: Evaluate for seizure  Brief clinical history: This is a Amy [name] with complicated course following elective colostomy takedown 04/04/16 including multifocal stroke, pneumonia, SMA stenosis requiring bypass. Now with persistent encephalopathy.   Medications:  Current Facility-Administered Medications:  .  Marland KitchenTPN (CLINIMIX-E) Adult, , Intravenous, Continuous TPN, Reginia Naas, RPH, Last Rate: 80 mL/hr at 04/19/16 1700 .  Marland KitchenTPN (CLINIMIX-E) Adult, , Intravenous, Continuous TPN, Cecilio Asper Batchelder, RPH .  0.9 %  sodium chloride infusion, 500 mL, Intravenous, Once PRN, Elam Dutch, MD .  acetaminophen (TYLENOL) tablet 325-650 mg, 325-650 mg, Oral, Q4H PRN **OR** acetaminophen (TYLENOL) suppository 325-650 mg, 325-650 mg, Rectal, Q4H PRN, Elam Dutch, MD .  albuterol (PROVENTIL) (2.5 MG/3ML) 0.083% nebulizer solution 2.5 mg, 2.5 mg, Nebulization, Q4H PRN, Raylene Miyamoto, MD .  antiseptic oral rinse solution (CORINZ), 7 mL, Mouth Rinse, QID, Raylene Miyamoto, MD, 7 mL at 04/19/16 1200 .  budesonide (PULMICORT) nebulizer solution 0.5 mg, 0.5 mg, Nebulization, BID, Erick Colace, NP, 0.5 mg at 04/19/16 0715 .  cefTRIAXone (ROCEPHIN) 2 g in dextrose 5 % 50 mL IVPB, 2 g, Intravenous, Q24H, Asencion Partridge, MD, 2 g at 04/19/16 [phone] .  chlorhexidine (PERIDEX) 0.12 % solution 15 mL, 15 mL, Mouth Rinse, BID, Raylene Miyamoto, MD, 15 mL at 04/19/16 0800 .  fentaNYL (SUBLIMAZE) 2,500 mcg in sodium chloride 0.9 % 250 mL (10 mcg/mL) infusion, 25-400 mcg/hr, Intravenous, Continuous, Javier Glazier, MD, Last Rate: 35 mL/hr at 04/19/16 1700, 350 mcg/hr at 04/19/16 1700 .  fentaNYL (SUBLIMAZE) bolus via infusion 25-50 mcg, 25-50 mcg, Intravenous, Q30 min PRN, Javier Glazier, MD, 50 mcg at 04/19/16 1000 .  guaiFENesin-dextromethorphan (ROBITUSSIN DM) 100-10 MG/5ML syrup 15  mL, 15 mL, Oral, Q4H PRN, Elam Dutch, MD .  insulin aspart (novoLOG) injection 0-9 Units, 0-9 Units, Subcutaneous, Q4H, Raylene Miyamoto, MD, 1 Units at 04/19/16 1240 .  ipratropium-albuterol (DUONEB) 0.5-2.5 (3) MG/3ML nebulizer solution 3 mL, 3 mL, Nebulization, Q6H, Javier Glazier, MD, 3 mL at 04/19/16 1432 .  labetalol (NORMODYNE,TRANDATE) injection 10 mg, 10 mg, Intravenous, Q10 min PRN, Elam Dutch, MD .  levothyroxine (SYNTHROID, LEVOTHROID) injection 44 mcg, 44 mcg, Intravenous, Daily, Javier Glazier, MD, 44 mcg at 04/19/16 0940 .  magnesium sulfate IVPB 2 g 50 mL, 2 g, Intravenous, Daily PRN, Elam Dutch, MD .  midazolam (VERSED) injection 2 mg, 2 mg, Intravenous, Q2H PRN, Asencion Partridge, MD, 2 mg at 04/19/16 1107 .  ondansetron (ZOFRAN) injection 4 mg, 4 mg, Intravenous, Q6H PRN, Gardiner Barefoot, NP, 4 mg at 03/28/2016 1333 .  ondansetron (ZOFRAN) injection 4 mg, 4 mg, Intravenous, Q6H PRN, Elam Dutch, MD .  phenol (CHLORASEPTIC) mouth spray 1 spray, 1 spray, Mouth/Throat, PRN, Elam Dutch, MD .  phenylephrine (NEO-SYNEPHRINE) 40 mg in dextrose 5 % 250 mL (0.16 mg/mL) infusion, 0-200 mcg/min, Intravenous, Titrated, Elam Dutch, MD, Last Rate: 5.6 mL/hr at 04/19/16 1700, 15 mcg/min at 04/19/16 1700 .  potassium chloride SA (K-DUR,KLOR-CON) CR tablet 20-40 mEq, 20-40 mEq, Oral, Daily PRN, Elam Dutch, MD .  RESOURCE THICKENUP CLEAR, , Oral, PRN, Erick Colace, NP .  sodium chloride flush (NS) 0.9 % injection 10-40 mL, 10-40 mL, Intracatheter, Q12H, Raylene Miyamoto, MD, 10 mL at 04/19/16 0942 .  sodium chloride flush (NS) 0.9 % injection 10-40 mL,  10-40 mL, Intracatheter, PRN, Raylene Miyamoto, MD  Description: This is a routine EEG performed using standard international 10-20 electrode placement. A total of 18 channels are recorded, including one for the EKG.  Activating Maneuvers: None  Findings:  The EKG channel demonstrates an  irregularly irregular rhythm with a rate of 90-100 beats per minute.   The patient is intubated and sedated during this study.   The background consists of low voltage delta activity with average frequency of about 1.5 Hz. This is poorly reactive.   There are no focal asymmetries. No epileptiform discharges are present. No seizures are recorded.   The technician noted tremors in the arms and feet during this study. There was no associated change on her EEG with these to suggest an epileptic etiology.    Impression: This is an abnormal EEG due to severe diffuse generalized slowing, consistent with a global encephalopathic process but non-specific as to etiology. Consider repeating the study off sedation if warranted.    Melba Coon, MD [facility]

## 2016-04-19 NOTE — Progress Notes (Signed)
CCS/Thelda Gagan Progress Note 1 Day Post-Op  Subjective: Patient seems to be more agitated and uncomfortable.  Not following commands  Objective: Vital signs in last 24 hours: Temp:  [96 F (35.6 C)-98.2 F (36.8 C)] 98 F (36.7 C) (08/02 0750) Pulse Rate:  [44-112] 97 (08/02 0712) Resp:  [5-22] 15 (08/02 0712) BP: (71-143)/(43-93) 143/48 (08/02 0712) SpO2:  [98 %-100 %] 100 % (08/02 0715) Arterial Line BP: (80-165)/(40-72) 83/62 (08/02 0615) FiO2 (%):  [40 %] 40 % (08/02 0715) Weight:  [69.6 kg (153 lb 7 oz)] 69.6 kg (153 lb 7 oz) (08/02 0448) Last BM Date:  (Unknown)  Intake/Output from previous day: 08/01 0701 - 08/02 0700 In: 3168.6 [I.V.:2798.6; NG/GT:90; IV Piggyback:250] Out: 0626 [Urine:660; Emesis/NG output:400; Drains:1975; Blood:200] Intake/Output this shift: Total I/O In: -  Out: 160 [Urine:60; Drains:100]  General: Agitated and uncomfortable.  Lungs: Claer  Abd: Tender diffusely.  No bowel sounds.  No enteric drainage from VAC.  Total VAC output 750 of serous fluid.  Extremities: No changes  Neuro: Agitated, delirious and somewhat combative.  Lab Results:  '@LABLAST2'$ (wbc:2,hgb:2,hct:2,plt:2) BMET ) Recent Labs  05/11/2016 0330 04/19/16 0430  NA 144 142  K 4.3 4.6  CL 115* 114*  CO2 26 25  GLUCOSE 116* 117*  BUN 28* 41*  CREATININE 0.96 0.95  CALCIUM 7.7* 7.6*   PT/INR  Recent Labs  04/26/2016 0330 04/19/16 0430  LABPROT 16.9* 16.5*  INR 1.37 1.32   ABG  Recent Labs  05/10/2016 0342 04/19/16 0432  PHART 7.372 7.341*  HCO3 30.5* 26.1*    Studies/Results: Dg Chest Port 1 View  Result Date: 04/19/2016 CLINICAL DATA:  Intubated patient, history of previous right lobectomy for malignancy. EXAM: PORTABLE CHEST 1 VIEW COMPARISON:  Portable chest x-ray of April 18, 2016 FINDINGS: The patient is quite rotated toward the right. The left lung is well-expanded. There is layering of pleural fluid posteriorly and inferiorly. On the right there is mild  stable volume loss. There is no pneumothorax. Surgical suture material is present in the upper hemi thorax. A small amount of pleural fluid at the right lung base is suspected. The heart is top-normal in size. The pulmonary vascularity is not engorged. The endotracheal tube tip lies approximately 4.2 cm above the carina. The esophagogastric tube tip projects below the inferior margin of the image. The right internal jugular venous catheter tip projects over the midportion of the SVC. The left PICC line tip projects just inferior to this. IMPRESSION: Allowing for rotation the appearance of the chest has not significantly changed since the previous study. Left greater than right pleural effusions. Mild bibasilar atelectasis. The support tubes are in reasonable position. Electronically Signed   By: David  Martinique M.D.   On: 04/19/2016 07:28   Dg Chest Port 1 View  Result Date: 04/26/2016 CLINICAL DATA:  Ventilator dependent respiratory failure. Ischemic bowel. Followup bibasilar atelectasis and bilateral pleural effusions. EXAM: PORTABLE CHEST 1 VIEW COMPARISON:  04/01/2016, 03/28/2016 and earlier. FINDINGS: Endotracheal tube tip in satisfactory position projecting approximately 5 cm above carina. Left arm PICC tip projects over the upper SVC. Right jugular central venous catheter tip projects over the lower SVC, unchanged. Nasogastric tube courses below the diaphragm into the stomach. Small to moderate-sized left pleural effusion, unchanged. Small right pleural effusion, improved since yesterday. Dense consolidation in the left lower lobe, unchanged. Mild atelectasis in the right lower lobe, unchanged. No new pulmonary parenchymal abnormality. IMPRESSION: 1.  Support apparatus satisfactory. 2. Small to moderate size left  pleural effusion, unchanged. Small right pleural effusion, slightly improved since yesterday. 3. Dense passive atelectasis and/or pneumonia involving the left lower lobe, unchanged. Mild right  basilar atelectasis, unchanged. 4. No new abnormalities. Electronically Signed   By: Evangeline Dakin M.D.   On: 05/06/2016 08:00    Anti-infectives: Anti-infectives    Start     Dose/Rate Route Frequency Ordered Stop   05/11/2016 1000  cefTRIAXone (ROCEPHIN) 2 g in dextrose 5 % 50 mL IVPB     2 g 100 mL/hr over 30 Minutes Intravenous Every 24 hours 04/23/2016 0846     04/17/16 1900  vancomycin (VANCOCIN) 500 mg in sodium chloride 0.9 % 100 mL IVPB     500 mg 100 mL/hr over 60 Minutes Intravenous Every 24 hours 04/17/16 1748 05/13/2016 1924   04/13/16 2130  fluconazole (DIFLUCAN) IVPB 200 mg     200 mg 100 mL/hr over 60 Minutes Intravenous Every 24 hours 04/13/16 2033     04/13/16 1700  vancomycin (VANCOCIN) IVPB 750 mg/150 ml premix  Status:  Discontinued     750 mg 150 mL/hr over 60 Minutes Intravenous Every 24 hours 04/12/16 1814 04/17/16 1748   04/12/16 2000  fluconazole (DIFLUCAN) tablet 200 mg  Status:  Discontinued     200 mg Oral Daily 04/12/16 1758 04/13/16 2028   04/12/16 1400  meropenem (MERREM) 1 g in sodium chloride 0.9 % 100 mL IVPB  Status:  Discontinued     1 g 200 mL/hr over 30 Minutes Intravenous Every 8 hours 04/12/16 1150 05/06/2016 0846   04/10/16 0500  meropenem (MERREM) 1 g in sodium chloride 0.9 % 100 mL IVPB  Status:  Discontinued     1 g 200 mL/hr over 30 Minutes Intravenous Every 12 hours 04/17/2016 1654 04/12/16 1150   04/10/16 0500  vancomycin (VANCOCIN) 500 mg in sodium chloride 0.9 % 100 mL IVPB  Status:  Discontinued     500 mg 100 mL/hr over 60 Minutes Intravenous Every 12 hours 03/20/2016 1654 04/12/16 1814   04/13/2016 1700  meropenem (MERREM) 1 g in sodium chloride 0.9 % 100 mL IVPB     1 g 200 mL/hr over 30 Minutes Intravenous  Once 04/10/2016 1638 04/11/2016 1730   04/08/2016 1700  vancomycin (VANCOCIN) IVPB 1000 mg/200 mL premix  Status:  Discontinued     1,000 mg 200 mL/hr over 60 Minutes Intravenous  Once 03/29/2016 1638 04/14/2016 1641   04/05/2016 1700  vancomycin  (VANCOCIN) 1,250 mg in sodium chloride 0.9 % 250 mL IVPB     1,250 mg 166.7 mL/hr over 90 Minutes Intravenous  Once 04/08/2016 1641 03/28/2016 1900      Assessment/Plan: s/p Procedure(s): SMALL BOWEL RESECTION, ANASTOMSIS AND RE-APPLICATION OF WOUND VAC Pan for second look tomorrow.  Possible closure of the abdomen.  LOS: 10 days   [name]. Dahlia Bailiff, MD, FACS [phone] [facility] Surgery 04/19/2016

## 2016-04-19 NOTE — Progress Notes (Signed)
PARENTERAL NUTRITION CONSULT NOTE - FOLLOW UP  Pharmacy Consult for TPN Indication: massive bowel resection  Patient Measurements: Height: '5\' 6"'$  (167.6 cm) Weight: 153 lb 7 oz (69.6 kg) IBW/kg (Calculated) : 59.3 Adjusted Body Weight: 63.9 kg  Vital Signs: Temp: 98.2 Amy [name] (36.8 C) (08/02 0406) Temp Source: Oral (08/02 0406) BP: 143/48 (08/02 0712) Pulse Rate: 97 (08/02 0712) Intake/Output from previous day: 08/01 0701 - 08/02 0700 In: 3168.6 [I.V.:2798.6; NG/GT:90; IV Piggyback:250] Out: 0962 [Urine:660; Emesis/NG output:400; Drains:1975; Blood:200] Intake/Output from this shift: No intake/output data recorded.   Insulin Requirements in the past 24 hours:  1 unit of SSI  Assessment: 75 yo Amy [name] w/ bowel ischemia, SMA thrombus, s/p OR 7/29 w/ mesenteric artery bypass and SBR, OR 7/30 2 more short segments of small bowel removed- per CCS note overall most of small bowel viable, SMA graft pulse palpable. Plan back to OR likely Tues.  To start TPN for severe protein calorie malnutrition. Critically ill on fent, phenyephrine and vasopressin drips and getting FFP, PRBCs  GI: Post op day #2 of ex lap, artery bypass and lysis of adhesions. OR visit on 8/1 found no frankly necrotic bowel, small portion resected with plans to bring back to OR on 8/3 for possible abdominal closure.  Albumin low down to 1.3, prealbumin low at 15. Last BM was on 7/27. Endo: CBGs controlled 100-140s on SSI. No hypoglycemic event yesterday. Pepcid in TPN Lytes: wnl today. CoCa 9.8. Has PRN replacements ordered for Mg and K. Renal: SCr stable, CrCl ~17m/min. UOP is low but up to 0.4 ml/kg/hr Pulm: Intubated with Fi02 of 40% Cards: BP ok but back on phenylephrine gtt. Hepatobil: AST and ALT elevated but trending down. TBili elevated and up to 3.0 (no jaundice noted). Trig wnl. Neuro: On fentanyl at 3551m/hr. RASS -1 (goal -1) ID: S/P vancomycin for possible MRSA PNA. On ceftriaxone and fluconazole. Afebrile, WBC  overall down but to 34.4  Best Practices: SCDs, famotidine TPN Access: PICC triple lumen TPN start date: 7/30 >>  Current Nutrition: NPO Clinimix E5/15 at 8027mr  Nutritional Goals: per RD recs on 7/31 Kcal: 1271 Protein: 105-115 g Amy [name]/U with RD recs  Plan:  Continue Clinimix E 5/15 at 53m30m Hold 20% lipid emulsion for first 7 days for ICU patients per ASPEN guidelines (Start date 8/6) TPN provides 96 g of protein and 1363 kCals per day meeting > 90% of protein needs and > 100% of kcal needs Unable to meet 100% of patient needs due to current hypocaloric goal Add MVI and TE in TPN Add famotidine '40mg'$  into TPN Continue sensitive SSI and adjust as needed Monitor TPN labs, daily Mg and Phos   NathElenor QuinonesarmD, BCPS Clinical Pharmacist Pager [phone] 7:26 AM

## 2016-04-19 NOTE — Progress Notes (Addendum)
  Progress Note    04/19/2016 8:02 AM 1 Day Post-Op  Subjective:  Sedated on vent  Afebrile  Gtts: Fentanyl  Neo  ABx: Ceftriaxone Vancomycin Fluconazole  Vitals:   04/19/16 0712 04/19/16 0750  BP: (!) 143/48   Pulse: 97   Resp: 15   Temp:  98 F (36.7 C)    Physical Exam: Cardiac:  regular Lungs:  Non labored Incisions:  Right thigh incision is clean with staples in tact without active drainage (serous drainage on bandage); abdominal wound vac with good seal   CBC    Component Value Date/Time   WBC 34.4 (H) 04/19/2016 0430   RBC 3.17 (L) 04/19/2016 0430   HGB 9.4 (L) 04/19/2016 0430   HCT 29.2 (L) 04/19/2016 0430   PLT 70 (L) 04/19/2016 0430   MCV 92.1 04/19/2016 0430   MCH 29.7 04/19/2016 0430   MCHC 32.2 04/19/2016 0430   RDW 17.0 (H) 04/19/2016 0430   LYMPHSABS 1.4 04/19/2016 0430   MONOABS 1.3 (H) 04/19/2016 0430   EOSABS 1.6 (H) 04/19/2016 0430   BASOSABS 0.0 04/19/2016 0430    BMET    Component Value Date/Time   NA 142 04/19/2016 0430   K 4.6 04/19/2016 0430   CL 114 (H) 04/19/2016 0430   CO2 25 04/19/2016 0430   GLUCOSE 117 (H) 04/19/2016 0430   BUN 41 (H) 04/19/2016 0430   CREATININE 0.95 04/19/2016 0430   CALCIUM 7.6 (L) 04/19/2016 0430   GFRNONAA 56 (L) 04/19/2016 0430   GFRAA >60 04/19/2016 0430    INR    Component Value Date/Time   INR 1.32 04/19/2016 0430     Intake/Output Summary (Last 24 hours) at 04/19/16 0802 Last data filed at 04/19/16 0600  Gross per 24 hour  Intake           3048.6 ml  Output             3235 ml  Net           -186.4 ml     Assessment:  75 y.o. female is s/p:  Exploratory laparotomy, lysis of adhesions, aorto to superior mesenteric artery bypass using reversed right greater saphenous vein (Felds) & small bowel resection without anastomosis and placement fo wound vac. 4 Days Post-Op And Exploratory laparotomy with small bowel resection x 2 & placement of abdominal wound vac 2 Days  Post-Op Small bowel resection, anastomosis and re-application of abdominal wound vac 1 Day Post-Op  Plan: -pt taken back to OR yesterday with SB resection - at time of operation, messenteric bypass had palpable pulse. -right thigh incision without active drainage (serous drainage on bandage that was changed yesterday) -DVT prophylaxis:  SCD's - still with thrombocytopenia and no heparin or Lovenox given -still with leukocytosis-abx per primary team -will continue to follow right thigh incision.   [name], PA-C [facility] [phone] 04/19/2016 8:02 AM   Agree with above. Overall stable. Will continue to follow right leg every few days. Abdomen issues primarily per general surgery at this point  Ruta Hinds, MD [facility] [place] Office: [phone] Pager: [phone]

## 2016-04-20 HISTORY — PX: TRACHEOSTOMY: SUR1362

## 2016-04-20 LAB — CBC WITH DIFFERENTIAL/PLATELET
Basophils Absolute: 0 10*3/uL (ref 0.0–0.1)
Basophils Relative: 0 %
Eosinophils Absolute: 1.6 10*3/uL — ABNORMAL HIGH (ref 0.0–0.7)
Eosinophils Relative: 5 %
HCT: 28.5 % — ABNORMAL LOW (ref 36.0–46.0)
Hemoglobin: 9 g/dL — ABNORMAL LOW (ref 12.0–15.0)
Lymphocytes Relative: 3 %
Lymphs Abs: 0.9 10*3/uL (ref 0.7–4.0)
MCH: 29.5 pg (ref 26.0–34.0)
MCHC: 31.6 g/dL (ref 30.0–36.0)
MCV: 93.4 fL (ref 78.0–100.0)
Monocytes Absolute: 2.5 10*3/uL — ABNORMAL HIGH (ref 0.1–1.0)
Monocytes Relative: 8 %
Neutro Abs: 26 10*3/uL — ABNORMAL HIGH (ref 1.7–7.7)
Neutrophils Relative %: 84 %
Platelets: 76 10*3/uL — ABNORMAL LOW (ref 150–400)
RBC: 3.05 MIL/uL — ABNORMAL LOW (ref 3.87–5.11)
RDW: 17.2 % — ABNORMAL HIGH (ref 11.5–15.5)
Smear Review: DECREASED
WBC: 31 10*3/uL — ABNORMAL HIGH (ref 4.0–10.5)

## 2016-04-20 LAB — GLUCOSE, CAPILLARY
GLUCOSE-CAPILLARY: 109 mg/dL — AB (ref 65–99)
GLUCOSE-CAPILLARY: 124 mg/dL — AB (ref 65–99)
Glucose-Capillary: 106 mg/dL — ABNORMAL HIGH (ref 65–99)
Glucose-Capillary: 112 mg/dL — ABNORMAL HIGH (ref 65–99)
Glucose-Capillary: 113 mg/dL — ABNORMAL HIGH (ref 65–99)
Glucose-Capillary: 117 mg/dL — ABNORMAL HIGH (ref 65–99)
Glucose-Capillary: 120 mg/dL — ABNORMAL HIGH (ref 65–99)

## 2016-04-20 LAB — COMPREHENSIVE METABOLIC PANEL
ALT: 72 U/L — ABNORMAL HIGH (ref 14–54)
AST: 112 U/L — ABNORMAL HIGH (ref 15–41)
Albumin: 1.3 g/dL — ABNORMAL LOW (ref 3.5–5.0)
Alkaline Phosphatase: 118 U/L (ref 38–126)
Anion gap: 4 — ABNORMAL LOW (ref 5–15)
BUN: 51 mg/dL — ABNORMAL HIGH (ref 6–20)
CO2: 26 mmol/L (ref 22–32)
Calcium: 7.7 mg/dL — ABNORMAL LOW (ref 8.9–10.3)
Chloride: 110 mmol/L (ref 101–111)
Creatinine, Ser: 1.12 mg/dL — ABNORMAL HIGH (ref 0.44–1.00)
GFR calc Af Amer: 53 mL/min — ABNORMAL LOW (ref >60)
GFR calc non Af Amer: 46 mL/min — ABNORMAL LOW (ref >60)
Glucose, Bld: 113 mg/dL — ABNORMAL HIGH (ref 65–99)
Potassium: 4.6 mmol/L (ref 3.5–5.1)
Sodium: 140 mmol/L (ref 135–145)
Total Bilirubin: 3.3 mg/dL — ABNORMAL HIGH (ref 0.3–1.2)
Total Protein: 3.3 g/dL — ABNORMAL LOW (ref 6.5–8.1)

## 2016-04-20 LAB — PROTIME-INR
INR: 1.3
Prothrombin Time: 16.3 seconds — ABNORMAL HIGH (ref 11.4–15.2)

## 2016-04-20 LAB — APTT: APTT: 36 s (ref 24–36)

## 2016-04-20 LAB — SURGICAL PCR SCREEN
MRSA, PCR: POSITIVE — AB
STAPHYLOCOCCUS AUREUS: POSITIVE — AB

## 2016-04-20 LAB — CORTISOL: Cortisol, Plasma: 10.8 ug/dL

## 2016-04-20 LAB — PHOSPHORUS: Phosphorus: 4 mg/dL (ref 2.5–4.6)

## 2016-04-20 LAB — MAGNESIUM: MAGNESIUM: 2 mg/dL (ref 1.7–2.4)

## 2016-04-20 LAB — FIBRINOGEN: FIBRINOGEN: 484 mg/dL — AB (ref 210–475)

## 2016-04-20 MED ORDER — SODIUM CHLORIDE 0.9% FLUSH
10.0000 mL | INTRAVENOUS | Status: DC | PRN
  Administered 2016-04-22: 10 mL
  Filled 2016-04-20: qty 40

## 2016-04-20 MED ORDER — PROPOFOL 10 MG/ML IV BOLUS
INTRAVENOUS | Status: AC
  Filled 2016-04-20: qty 20

## 2016-04-20 MED ORDER — FENTANYL CITRATE (PF) 250 MCG/5ML IJ SOLN
INTRAMUSCULAR | Status: AC
  Filled 2016-04-20: qty 5

## 2016-04-20 MED ORDER — SODIUM CHLORIDE 0.9% FLUSH
10.0000 mL | Freq: Two times a day (BID) | INTRAVENOUS | Status: DC
  Administered 2016-04-20 – 2016-04-26 (×8): 10 mL

## 2016-04-20 MED ORDER — CHLORHEXIDINE GLUCONATE CLOTH 2 % EX PADS
6.0000 | MEDICATED_PAD | Freq: Every day | CUTANEOUS | Status: AC
  Administered 2016-04-20 – 2016-04-24 (×4): 6 via TOPICAL

## 2016-04-20 MED ORDER — SODIUM CHLORIDE 0.9% FLUSH
10.0000 mL | Freq: Two times a day (BID) | INTRAVENOUS | Status: DC
  Administered 2016-04-20 – 2016-04-26 (×6): 10 mL

## 2016-04-20 MED ORDER — TRACE MINERALS CR-CU-MN-SE-ZN 10-1000-500-60 MCG/ML IV SOLN
INTRAVENOUS | Status: AC
  Administered 2016-04-20: 18:00:00 via INTRAVENOUS
  Filled 2016-04-20: qty 1920

## 2016-04-20 MED ORDER — MUPIROCIN 2 % EX OINT
1.0000 "application " | TOPICAL_OINTMENT | Freq: Two times a day (BID) | CUTANEOUS | Status: AC
  Administered 2016-04-20 – 2016-04-25 (×9): 1 via NASAL
  Filled 2016-04-20: qty 22

## 2016-04-20 MED ORDER — MIDAZOLAM HCL 2 MG/2ML IJ SOLN
INTRAMUSCULAR | Status: AC
  Filled 2016-04-20: qty 2

## 2016-04-20 MED ORDER — DEXTROSE-NACL 5-0.45 % IV SOLN
INTRAVENOUS | Status: DC
  Administered 2016-04-20 – 2016-04-21 (×3): via INTRAVENOUS

## 2016-04-20 MED ORDER — SODIUM CHLORIDE 0.9% FLUSH
10.0000 mL | INTRAVENOUS | Status: DC | PRN
  Administered 2016-04-21: 10 mL
  Filled 2016-04-20: qty 40

## 2016-04-20 NOTE — Progress Notes (Signed)
Taking the patient back to the OR today for possible closure depending on the state of the bowel and the anastomosis.  No enteric drainage from the NPWD, but out put has been 1300cc.  Hemoglobin in 9.  [name]. Dahlia Bailiff, MD, [place] [phone] [facility] Surgery

## 2016-04-20 NOTE — Progress Notes (Signed)
Surgery delayed until tomorrow because of a run of emergency cases today.  Will put on for 0900 tomorrow AM.  [name]. Dahlia Bailiff, MD, [place] [phone] [facility] Surgery

## 2016-04-20 NOTE — Progress Notes (Signed)
PARENTERAL NUTRITION CONSULT NOTE - FOLLOW UP  Pharmacy Consult for TPN Indication: massive bowel resection  Patient Measurements: Height: '5\' 6"'$  (167.6 cm) Weight: 151 lb 7.3 oz (68.7 kg) IBW/kg (Calculated) : 59.3 Adjusted Body Weight: 63.9 kg  Vital Signs: Temp: 97.9 F (36.6 C) (08/03 0402) Temp Source: Oral (08/03 0402) BP: 114/43 (08/03 0315) Pulse Rate: 84 (08/03 0615) Intake/Output from previous day: 08/02 0701 - 08/03 0700 In: 2800 [I.V.:2620; NG/GT:130; IV Piggyback:50] Out: 3299 [Urine:1425; Emesis/NG output:450; MEQAST:4196] Intake/Output from this shift: No intake/output data recorded.   Insulin Requirements in the past 24 hours:  2 units of SSI  Assessment: 75 yo F w/ bowel ischemia, SMA thrombus, s/p OR 7/29 w/ mesenteric artery bypass and SBR, OR 7/30 2 more short segments of small bowel removed- per CCS note overall most of small bowel viable, SMA graft pulse palpable. Plan back to OR likely Tues.  To start TPN for severe protein calorie malnutrition. Critically ill on fent, phenyephrine and vasopressin drips and getting FFP, PRBCs  GI: Post op day #2 after ex lap, artery bypass and lysis of adhesions. OR visit on 8/1 found no frankly necrotic bowel, small portion resected with plans to bring back to OR on 8/3 for possible abdominal closure.  NG output 450 ml yesterday. Albumin low down to 1.3, prealbumin low at 15. Last BM was on 7/27. Endo: CBGs controlled 100-120s on SSI. Hypoglycemic event on 7/31. Pepcid in TPN Lytes: wnl today. CoCa 9.8. Phos and Mg ok. Has PRN replacements ordered for Mg and K. Renal: SCr up to 1.12 today, CrCl ~72m/min. UOP improved to 0.940mkg/hr yesterday Pulm: Intubated with Fi02 of 40% Cards: BP ok but back on phenylephrine gtt at 2027mmin Hepatobil: AST and ALT elevated but trending down. TBili elevated and up to 3.3 (no jaundice noted). Trig wnl. Neuro: On fentanyl at 350m66mr. Seemed agitated yesterday so goalRASS changed to -2.  Current RASS -2 ID: S/P vancomycin for possible MRSA PNA. On ceftriaxone. Afebrile, WBC overall down but to 31.  Best Practices: SCDs, famotidine TPN Access: PICC triple lumen TPN start date: 7/30 >>  Current Nutrition: NPO Clinimix E5/15 at 80ml13m Nutritional Goals: per RD recs on 8/2 Kcal: 1287 Protein: 105-115 g F/U with RD recs  Plan:  Continue Clinimix E 5/15 at 80ml/5mold 20% lipid emulsion for first 7 days for ICU patients per ASPEN guidelines (Start date 8/6) TPN provides 96 g of protein and 1363 kCals per day meeting > 90% of protein needs and > 100% of kcal needs Unable to meet 100% of patient protein needs with Clinimix due to current hypocaloric goal Add MVI and TE in TPN Add famotidine '40mg'$  into TPN Continue sensitive SSI and adjust as needed Monitor TPN labs, daily Mg and Phos F/U plans after trip to OR today  NathanElenor QuinonesmD, BCPS [facility], [place] Pharmacist Pager [phone] 7:43 AM

## 2016-04-20 NOTE — Progress Notes (Signed)
PULMONARY / CRITICAL CARE MEDICINE   Name: Amy [name] MRN: [id number] DOB: June 06, 1941    ADMISSION DATE:  03/30/2016 CONSULTATION DATE:  7/23  REFERRING MD:  [facility] hospital per family request   CHIEF COMPLAINT:  Acute encephalopathy and progressive respiratory failure   HISTORY OF PRESENT ILLNESS:   This is a 75 year old white female who was initially admitted to [facility] hospital where she underwent an elective loop colostomy takedown on 7/18. Her immediate post-op course was c/b lethargy and actually required narcan in PACU. She never really improved w/ hospital notes continuing to raise concern for altered mental status and difficulty to arouse. She ultimately required emergent intubation for hypercarbia (CO2 52) and inability to protect airway. She was transferred per family request on 7/23 for further evaluation and care. MRI confirmed new CVA. Suspect this was multifactorial: CVA, meds, +/- pneumonia. She was extubated on 7/24 and transferred to the SDU. On 7/28, she developed worsening abdominal pain and leukocytosis. 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 On 7/29, she underwent aorto to superior mesenteric artery bypass with greater saphenous vein graft and small bowel resection. In the immediate post-op period, she became hypotensive to the 50s and was started on pressors. She was transferred back to [facility] service.  SUBJECTIVE:  Plan OR today, on vent, no change in neurostatus, low dose neo  VITAL SIGNS: BP (!) 94/52   Pulse 89   Temp 97.4 F (36.3 C) (Axillary)   Resp 14   Ht '5\' 6"'$  (1.676 m)   Wt 68.7 kg (151 lb 7.3 oz)   SpO2 100%   BMI 24.45 kg/m   HEMODYNAMICS:    VENTILATOR SETTINGS: Vent Mode: PRVC FiO2 (%):  [40 %] 40 % Set Rate:  [14 bmp] 14 bmp Vt Set:  [480 mL] 480 mL PEEP:  [5 cmH20] 5 cmH20 Plateau Pressure:  [17 cmH20-20 cmH20] 18 cmH20  INTAKE / OUTPUT: I/O last 3 completed shifts: In: 4323.1  [I.V.:4013.1; NG/GT:160; IV Piggyback:150] Out: 9381 [Urine:1650; Emesis/NG output:450; Drains:2050]  PHYSICAL EXAMINATION: General: Sedated. No acute distress. Family at bedside.  Integument: Abdominal wound VAC in place HEENT:  Line clean, jvd wnl Cardiovascular: Regular rate, normal S1 & S2 Pulmonary:  Coarse mild Abdomen: Soft. nobowel sounds. Nondistended. , vac clean Neurological: Sedated given versed, perrl  LABS:  BMET  Recent Labs Lab 05/17/2016 0330 04/19/16 0430 04/20/16 0415  NA 144 142 140  K 4.3 4.6 4.6  CL 115* 114* 110  CO2 '26 25 26  '$ BUN 28* 41* 51*  CREATININE 0.96 0.95 1.12*  GLUCOSE 116* 117* 113*    Electrolytes  Recent Labs Lab 04/23/2016 0330 04/19/16 0430 04/20/16 0415  CALCIUM 7.7* 7.6* 7.7*  MG 2.0 1.9 2.0  PHOS 3.1 3.2 4.0    CBC  Recent Labs Lab 05/15/2016 2011 04/19/16 0430 04/20/16 0415  WBC 34.3* 34.4* 31.0*  HGB 9.8* 9.4* 9.0*  HCT 30.0* 29.2* 28.5*  PLT 58* 70* 76*    Coag's  Recent Labs Lab 04/20/2016 0330 04/19/16 0430 04/20/16 0415  APTT 31 34 36  INR 1.37 1.32 1.30    Sepsis Markers  Recent Labs Lab 03/19/2016 1455 03/30/2016 1814 04/17/16 1653  LATICACIDVEN 7.6* 4.3* 1.3    ABG  Recent Labs Lab 03/28/2016 0410 05/18/2016 0342 04/19/16 0432  PHART 7.366 7.372 7.341*  PCO2ART 40.4 52.2* 48.1*  PO2ART 110.0* 132.0* 135.0*    Liver Enzymes  Recent Labs Lab 05/07/2016 0330 04/19/16  0430 04/20/16 0415  AST 183* 113* 112*  ALT 107* 79* 72*  ALKPHOS 95 98 118  BILITOT 3.1* 3.0* 3.3*  ALBUMIN 1.8* 1.3* 1.3*    Cardiac Enzymes No results for input(s): TROPONINI, PROBNP in the last 168 hours.  Glucose  Recent Labs Lab 04/19/16 0742 04/19/16 1158 04/19/16 1616 04/19/16 1934 04/19/16 2354 04/20/16 0353  GLUCAP 121* 125* 112* 99 106* 120*    Imaging No results found. STUDIES:  [facility] head (at [facility] negative  MRI brain  7/23: Bilateral cerebral hemispheres and tight cerebellar hemisphere  patchy acute and early subacute infarcts. No mass effect or hemorrhage. Central thromboembolic source is suspected.  EEG 7/23: Abnormal due to moderate diffuse slowing of the waking background. No seizure or epiletiform discharges.  TTE 7/24: EF 50-55%, lateral wall appears hypokinetic, systolic function normal, mild MR regurg Carotid US 7/24: right mild soft plaque CCA, mild to moderate plaque origin and proximal ICA and ECA 1-39% ICA stenosis.  EEG 8/2>>>enceph, no focus  MICROBIOLOGY: MRSA PCR 7/23:  Positive  Urine Ctx 7/23:  Negative  Blood Ctx x2 7/23:  Negative  Tracheal Asp Ctx 7/23:  MRSA Blood Ctx x2 7/28>>> NGTD Urine Ctx 7/28:  Negative  ANTIBIOTICS: Vancomycin 7/23 >>>10 days  Meropenem 7/23 >> 8/1 Diflucan 7/27 >>>8/2 Ceftriaxone 8/1 >>>  SIGNIFICANT EVENTS: Loop colostomy takedown 7/18 7/18-7/22: progressively lethargic  7/23 transferred to [facility] s/p getting intubated for AMS and hypercarbia. Surgical service consulted at [facility] on arrival  7/24 on CCB gtt for af. But fully awake and following commands. Extubated. 7/25 SLP evaluation for diet, ASA started, FEES 7/28 developed worsening abdominal pain, leukocytosis, CT showing complete SMA occlusion 7/29 Underwent aorto to SMA bypass with greater saphenous vein graft and small bowel resection 7/29 Developed hypotension to the 50s in post-op period, started on pressors 7/29 Code status changed to DNR 7/30 Ex Lap by CCS w/ necrotic bowel & ischemia s/p resection w/ intact bypass & oozing over peritoneal surfaces. 8/1 OR, limited small bowel resection, anastamosis x2, vascular graft intact 8/3 neo needed again, back to OR planned  LINES/TUBES: OETT 7/23>>>7/24; 7/29 >> R IJ CVL 7/19 >> L Radial Art Line 7/29 >> 8/1 patient pulled R Radial Art Line 8/1 >> FOLEY 7/29 >> R NGT 7/29 >> IABP 7/29 >>?  ASSESSMENT / PLAN:  PULMONARY A: Acute Hypercarbic Respiratory Failure Acute Hypoxic Respiratory Failure MRSA  Pneumonia H/O COPD H/O OSA (intolerant of CPAP) H/O RUL Lobectomy - For NSCLC. At risk TRALI P:   abdo remains open with vac No new abg No sbt planned Even to neg 300 cc goals Would consider trach pending OR findings on survivability   CARDIOVASCULAR A:  Shock -SIRS / sedation Atrial Fibrillation Elevated Troponin I - Likely demand ischemia. 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 Bowel Ischemia - S/P resection 7/29 & further resection 7/30. Shock Liver H/O GERD  P:   TPN Pepcid IV q12hr OR today  RENAL A:   ARF, ATN? R/o insensible losses P:   Would not use lasix May re increase volume maintained  - open abdo, insensible losses  HEMATOLOGIC A:   DIC/Coagulopathy - S/P Massive Transfusion. S/P 8u FFP 7/30 & 8u FFP 7/29. Anemia - Secondary to acute blood loss. S/P 4u PRBC 7/30 &  8u PRBC 7/29. Leukocytosis - Secondary to Sepsis / SIRS Thrombocytopenia - Secondary to consumption w/ DIC. S/P net 3u plts Embolic CVA  P:  Cbc in am  SCD  INFECTIOUS A:   Sepsis MRSA Pneumonia- possible At risk abdo contam P:   Keep ceftriaxone  ENDOCRINE A:   H/O Hypothyroidism (TSH 28.2) Hypoglycemia, borderline at times  P:   Switch to Levothyroxine 63mg IV daily Continuing D5 1/2NS to 75 Continuing Accu-Checks q4hr SSI limit  NEUROLOGIC A:   Sedation on Ventilator Post-operative Pain 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   P:   Goal RASS: -2 Fentanyl gtt & IV prn Pain Versed IV prn  Re assess with wua post op  FAMILY UPDATE: I have updated family today in full  Ccm time 35 min  DLavon Paganini FTitus Mould MD, FLa PlataPgr: 3MeridenPulmonary & Critical Care 04/20/2016 8:28 AM

## 2016-04-20 NOTE — Plan of Care (Signed)
Problem: Cardiac: Goal: Will show no evidence of cardiac arrhythmias Outcome: Progressing Pt chronic Afib  Problem: Physical Regulation: Goal: Ability to maintain clinical measurements within normal limits will improve Outcome: Progressing Pt on low dose neo. Goal: Postoperative complications will be avoided or minimized Outcome: Not Progressing To OR for possible closure.  Problem: Respiratory: Goal: Ability to maintain adequate ventilation will improve Outcome: Not Progressing ABG 04/19/16 parameters reviewed by CCM. No SBT until abdominal closure Goal: Respiratory status will improve Outcome: Not Progressing See previous

## 2016-04-20 NOTE — Progress Notes (Signed)
Inpatient Rehabilitation  Continuing to follow along for readiness for IP Rehab, note potential for surgery today.  Will continue to follow for therapy recommendations and medical readiness.   Carmelia Roller., CCC/SLP Admission Coordinator  [place]  Cell [phone]

## 2016-04-20 NOTE — Progress Notes (Signed)
Art line set up changed.

## 2016-04-20 NOTE — Progress Notes (Signed)
MD notified of cool right lower extremity, tibial pulse noted with doppler, no interventions at this time, will continue to monitor.   Sherrie Mustache

## 2016-04-20 NOTE — Progress Notes (Signed)
Art line dressing changed. Pt has weeping edema in right extremity.

## 2016-04-21 ENCOUNTER — Encounter (HOSPITAL_COMMUNITY): Admission: EM | Disposition: E | Payer: Self-pay | Source: Other Acute Inpatient Hospital | Attending: Internal Medicine

## 2016-04-21 ENCOUNTER — Encounter (HOSPITAL_COMMUNITY): Payer: Self-pay | Admitting: Anesthesiology

## 2016-04-21 ENCOUNTER — Inpatient Hospital Stay (HOSPITAL_COMMUNITY): Payer: Medicare Other

## 2016-04-21 ENCOUNTER — Inpatient Hospital Stay (HOSPITAL_COMMUNITY): Payer: Medicare Other | Admitting: Certified Registered Nurse Anesthetist

## 2016-04-21 HISTORY — PX: LAPAROTOMY: SHX154

## 2016-04-21 LAB — POCT I-STAT 3, ART BLOOD GAS (G3+)
Acid-base deficit: 4 mmol/L — ABNORMAL HIGH (ref 0.0–2.0)
BICARBONATE: 25.3 meq/L — AB (ref 20.0–24.0)
Bicarbonate: 22.5 mEq/L (ref 20.0–24.0)
O2 Saturation: 100 %
O2 Saturation: 99 %
PCO2 ART: 45.6 mmHg — AB (ref 35.0–45.0)
PH ART: 7.383 (ref 7.350–7.450)
PH ART: 7.302 — AB (ref 7.350–7.450)
TCO2: 27 mmol/L (ref 0–100)
TCO2: 24 mmol/L (ref 0–100)
pCO2 arterial: 42.4 mmHg (ref 35.0–45.0)
pO2, Arterial: 505 mmHg — ABNORMAL HIGH (ref 80.0–100.0)
pO2, Arterial: 129 mmHg — ABNORMAL HIGH (ref 80.0–100.0)

## 2016-04-21 LAB — PROTIME-INR
INR: 1.19
PROTHROMBIN TIME: 15.2 s (ref 11.4–15.2)

## 2016-04-21 LAB — GLUCOSE, CAPILLARY
GLUCOSE-CAPILLARY: 150 mg/dL — AB (ref 65–99)
GLUCOSE-CAPILLARY: 181 mg/dL — AB (ref 65–99)
GLUCOSE-CAPILLARY: 158 mg/dL — AB (ref 65–99)
Glucose-Capillary: 102 mg/dL — ABNORMAL HIGH (ref 65–99)
Glucose-Capillary: 102 mg/dL — ABNORMAL HIGH (ref 65–99)

## 2016-04-21 LAB — CBC WITH DIFFERENTIAL/PLATELET
BASOS ABS: 0 10*3/uL (ref 0.0–0.1)
BASOS PCT: 0 %
EOS ABS: 1.7 10*3/uL — AB (ref 0.0–0.7)
Eosinophils Relative: 6 %
HCT: 29 % — ABNORMAL LOW (ref 36.0–46.0)
HEMOGLOBIN: 9.1 g/dL — AB (ref 12.0–15.0)
LYMPHS PCT: 3 %
Lymphs Abs: 0.8 10*3/uL (ref 0.7–4.0)
MCH: 29.6 pg (ref 26.0–34.0)
MCHC: 31.4 g/dL (ref 30.0–36.0)
MCV: 94.5 fL (ref 78.0–100.0)
Monocytes Absolute: 2.2 10*3/uL — ABNORMAL HIGH (ref 0.1–1.0)
Monocytes Relative: 8 %
NEUTROS PCT: 83 %
Neutro Abs: 23 10*3/uL — ABNORMAL HIGH (ref 1.7–7.7)
Platelets: 81 10*3/uL — ABNORMAL LOW (ref 150–400)
RBC: 3.07 MIL/uL — ABNORMAL LOW (ref 3.87–5.11)
RDW: 17 % — ABNORMAL HIGH (ref 11.5–15.5)
WBC: 27.7 10*3/uL — ABNORMAL HIGH (ref 4.0–10.5)

## 2016-04-21 LAB — COMPREHENSIVE METABOLIC PANEL
ALBUMIN: 1.3 g/dL — AB (ref 3.5–5.0)
ALT: 67 U/L — AB (ref 14–54)
AST: 109 U/L — AB (ref 15–41)
Alkaline Phosphatase: 129 U/L — ABNORMAL HIGH (ref 38–126)
Anion gap: 4 — ABNORMAL LOW (ref 5–15)
BILIRUBIN TOTAL: 3.7 mg/dL — AB (ref 0.3–1.2)
BUN: 60 mg/dL — AB (ref 6–20)
CHLORIDE: 109 mmol/L (ref 101–111)
CO2: 24 mmol/L (ref 22–32)
CREATININE: 1.21 mg/dL — AB (ref 0.44–1.00)
Calcium: 7.7 mg/dL — ABNORMAL LOW (ref 8.9–10.3)
GFR, EST AFRICAN AMERICAN: 49 mL/min — AB (ref >60)
GFR, EST NON AFRICAN AMERICAN: 42 mL/min — AB (ref >60)
Glucose, Bld: 110 mg/dL — ABNORMAL HIGH (ref 65–99)
Potassium: 4.8 mmol/L (ref 3.5–5.1)
Sodium: 137 mmol/L (ref 135–145)
TOTAL PROTEIN: 3.5 g/dL — AB (ref 6.5–8.1)

## 2016-04-21 LAB — PREALBUMIN: PREALBUMIN: 5.7 mg/dL — AB (ref 18–38)

## 2016-04-21 LAB — FIBRINOGEN: Fibrinogen: 596 mg/dL — ABNORMAL HIGH (ref 210–475)

## 2016-04-21 LAB — APTT: aPTT: 39 seconds — ABNORMAL HIGH (ref 24–36)

## 2016-04-21 LAB — MAGNESIUM: MAGNESIUM: 1.9 mg/dL (ref 1.7–2.4)

## 2016-04-21 LAB — PHOSPHORUS: Phosphorus: 4.7 mg/dL — ABNORMAL HIGH (ref 2.5–4.6)

## 2016-04-21 LAB — TRIGLYCERIDES: TRIGLYCERIDES: 105 mg/dL (ref <150)

## 2016-04-21 SURGERY — LAPAROTOMY, EXPLORATORY
Anesthesia: General | Site: Abdomen

## 2016-04-21 MED ORDER — CEFTRIAXONE SODIUM 2 G IJ SOLR
2.0000 g | Freq: Once | INTRAMUSCULAR | Status: AC
  Filled 2016-04-21: qty 2

## 2016-04-21 MED ORDER — HYDROMORPHONE HCL 1 MG/ML IJ SOLN
0.2500 mg | INTRAMUSCULAR | Status: DC | PRN
  Administered 2016-04-23 – 2016-04-28 (×7): 0.5 mg via INTRAVENOUS
  Filled 2016-04-21 (×9): qty 1

## 2016-04-21 MED ORDER — LACTATED RINGERS IV SOLN
INTRAVENOUS | Status: DC | PRN
  Administered 2016-04-21: 09:00:00 via INTRAVENOUS

## 2016-04-21 MED ORDER — HYDROCORTISONE NA SUCCINATE PF 100 MG IJ SOLR
50.0000 mg | Freq: Four times a day (QID) | INTRAMUSCULAR | Status: DC
  Administered 2016-04-21 – 2016-04-22 (×4): 50 mg via INTRAVENOUS
  Filled 2016-04-21 (×4): qty 2

## 2016-04-21 MED ORDER — ETOMIDATE 2 MG/ML IV SOLN
20.0000 mg | Freq: Once | INTRAVENOUS | Status: AC
  Administered 2016-04-21: 20 mg via INTRAVENOUS

## 2016-04-21 MED ORDER — FENTANYL BOLUS VIA INFUSION
100.0000 ug | INTRAVENOUS | Status: DC | PRN
  Administered 2016-04-21 (×2): 100 ug via INTRAVENOUS

## 2016-04-21 MED ORDER — 0.9 % SODIUM CHLORIDE (POUR BTL) OPTIME
TOPICAL | Status: DC | PRN
  Administered 2016-04-21 (×3): 1000 mL

## 2016-04-21 MED ORDER — FENTANYL CITRATE (PF) 250 MCG/5ML IJ SOLN
INTRAMUSCULAR | Status: AC
  Filled 2016-04-21: qty 5

## 2016-04-21 MED ORDER — PROMETHAZINE HCL 25 MG/ML IJ SOLN: 6.2500 mg | INTRAMUSCULAR | Status: DC | PRN

## 2016-04-21 MED ORDER — FENTANYL CITRATE (PF) 100 MCG/2ML IJ SOLN
INTRAMUSCULAR | Status: DC | PRN
  Administered 2016-04-21: 25 ug via INTRAVENOUS
  Administered 2016-04-21 (×2): 50 ug via INTRAVENOUS
  Administered 2016-04-21: 25 ug via INTRAVENOUS
  Administered 2016-04-21 (×2): 50 ug via INTRAVENOUS

## 2016-04-21 MED ORDER — MIDAZOLAM HCL 2 MG/2ML IJ SOLN
INTRAMUSCULAR | Status: AC
  Filled 2016-04-21: qty 2

## 2016-04-21 MED ORDER — SODIUM CHLORIDE 0.9 % IV BOLUS (SEPSIS)
500.0000 mL | Freq: Once | INTRAVENOUS | Status: AC
  Administered 2016-04-21: 500 mL via INTRAVENOUS

## 2016-04-21 MED ORDER — LABETALOL HCL 5 MG/ML IV SOLN
10.0000 mg | INTRAVENOUS | Status: DC | PRN
Start: 2016-04-21 — End: 2016-04-30
  Administered 2016-04-23 – 2016-04-26 (×6): 10 mg via INTRAVENOUS
  Filled 2016-04-21 (×6): qty 4

## 2016-04-21 MED ORDER — MAGNESIUM SULFATE IN D5W 1-5 GM/100ML-% IV SOLN
1.0000 g | Freq: Once | INTRAVENOUS | Status: AC
  Administered 2016-04-21: 1 g via INTRAVENOUS
  Filled 2016-04-21: qty 100

## 2016-04-21 MED ORDER — M.V.I. ADULT IV INJ
INTRAVENOUS | Status: AC
  Administered 2016-04-21: 18:00:00 via INTRAVENOUS
  Filled 2016-04-21: qty 1920

## 2016-04-21 MED ORDER — FENTANYL CITRATE (PF) 100 MCG/2ML IJ SOLN: 25.0000 ug | INTRAMUSCULAR | Status: DC | PRN

## 2016-04-21 MED ORDER — ARTIFICIAL TEARS OP OINT
TOPICAL_OINTMENT | OPHTHALMIC | Status: DC | PRN
  Administered 2016-04-21: 1 via OPHTHALMIC

## 2016-04-21 MED ORDER — ROCURONIUM BROMIDE 100 MG/10ML IV SOLN
INTRAVENOUS | Status: DC | PRN
  Administered 2016-04-21: 50 mg via INTRAVENOUS

## 2016-04-21 SURGICAL SUPPLY — 42 items
BLADE SURG ROTATE 9660 (MISCELLANEOUS) IMPLANT
CANISTER SUCTION 2500CC (MISCELLANEOUS) ×3 IMPLANT
CHLORAPREP W/TINT 26ML (MISCELLANEOUS) IMPLANT
COVER SURGICAL LIGHT HANDLE (MISCELLANEOUS) ×3 IMPLANT
DRAPE LAPAROSCOPIC ABDOMINAL (DRAPES) ×3 IMPLANT
DRAPE WARM FLUID 44X44 (DRAPE) ×3 IMPLANT
DRSG OPSITE POSTOP 4X10 (GAUZE/BANDAGES/DRESSINGS) IMPLANT
DRSG OPSITE POSTOP 4X8 (GAUZE/BANDAGES/DRESSINGS) IMPLANT
DRSG VAC ATS LRG SENSATRAC (GAUZE/BANDAGES/DRESSINGS) ×3 IMPLANT
ELECT BLADE 6.5 EXT (BLADE) ×3 IMPLANT
ELECT CAUTERY BLADE 6.4 (BLADE) ×3 IMPLANT
ELECT REM PT RETURN 9FT ADLT (ELECTROSURGICAL) ×3
ELECTRODE REM PT RTRN 9FT ADLT (ELECTROSURGICAL) ×1 IMPLANT
GLOVE BIOGEL PI IND STRL 7.0 (GLOVE) ×1 IMPLANT
GLOVE BIOGEL PI IND STRL 8 (GLOVE) ×3 IMPLANT
GLOVE BIOGEL PI INDICATOR 7.0 (GLOVE) ×2
GLOVE BIOGEL PI INDICATOR 8 (GLOVE) ×6
GLOVE ECLIPSE 7.5 STRL STRAW (GLOVE) ×9 IMPLANT
GOWN STRL REUS W/ TWL LRG LVL3 (GOWN DISPOSABLE) ×1 IMPLANT
GOWN STRL REUS W/ TWL XL LVL3 (GOWN DISPOSABLE) ×2 IMPLANT
GOWN STRL REUS W/TWL LRG LVL3 (GOWN DISPOSABLE) ×2
GOWN STRL REUS W/TWL XL LVL3 (GOWN DISPOSABLE) ×4
KIT BASIN OR (CUSTOM PROCEDURE TRAY) ×3 IMPLANT
KIT ROOM TURNOVER OR (KITS) ×3 IMPLANT
LIGASURE IMPACT 36 18CM CVD LR (INSTRUMENTS) IMPLANT
NS IRRIG 1000ML POUR BTL (IV SOLUTION) ×6 IMPLANT
PACK GENERAL/GYN (CUSTOM PROCEDURE TRAY) ×3 IMPLANT
PAD ARMBOARD 7.5X6 YLW CONV (MISCELLANEOUS) ×3 IMPLANT
SEPRAFILM PROCEDURAL PACK 3X5 (MISCELLANEOUS) IMPLANT
SPECIMEN JAR LARGE (MISCELLANEOUS) IMPLANT
SPONGE LAP 18X18 X RAY DECT (DISPOSABLE) IMPLANT
STAPLER VISISTAT 35W (STAPLE) ×3 IMPLANT
SUCTION POOLE TIP (SUCTIONS) ×3 IMPLANT
SUT NOVA 1 T20/GS 25DT (SUTURE) ×15 IMPLANT
SUT PDS AB 1 TP1 96 (SUTURE) ×6 IMPLANT
SUT SILK 2 0 SH CR/8 (SUTURE) ×3 IMPLANT
SUT SILK 2 0 TIES 10X30 (SUTURE) ×3 IMPLANT
SUT SILK 3 0 SH CR/8 (SUTURE) ×3 IMPLANT
SUT SILK 3 0 TIES 10X30 (SUTURE) ×3 IMPLANT
TOWEL OR 17X26 10 PK STRL BLUE (TOWEL DISPOSABLE) ×3 IMPLANT
TRAY FOLEY CATH 16FRSI W/METER (SET/KITS/TRAYS/PACK) IMPLANT
YANKAUER SUCT BULB TIP NO VENT (SUCTIONS) ×3 IMPLANT

## 2016-04-21 NOTE — Progress Notes (Addendum)
  Progress Note    05/12/2016 9:20 AM 3 Days Post-Op  Subjective:  Vented and sedated.  Afebrile HR  80's-90's Afib 62'Z-308'M systolic 57% .40FiO2   Gtts; Fentanyl Neo TPN  Vitals:   05/08/2016 0700 05/10/2016 0823  BP:    Pulse: 82   Resp: 18   Temp:  98.2 F (36.8 C)    Physical Exam: Incisions:  there continues to be a continuous bloody/serosanginous drainage from the distal portion of the proximal incision.  Otherwise, leg incision looks okay with staples in tact.  Abdomen:  Soft; wound vac with good seal  CBC    Component Value Date/Time   WBC 27.7 (H) 04/23/2016 0500   RBC 3.07 (L) 04/19/2016 0500   HGB 9.1 (L) 05/13/2016 0500   HCT 29.0 (L) 04/27/2016 0500   PLT 81 (L) 05/13/2016 0500   MCV 94.5 05/05/2016 0500   MCH 29.6 05/17/2016 0500   MCHC 31.4 04/22/2016 0500   RDW 17.0 (H) 04/19/2016 0500   LYMPHSABS 0.8 05/11/2016 0500   MONOABS 2.2 (H) 04/20/2016 0500   EOSABS 1.7 (H) 05/09/2016 0500   BASOSABS 0.0 05/13/2016 0500    BMET    Component Value Date/Time   NA 137 04/20/2016 0500   K 4.8 05/07/2016 0500   CL 109 04/29/2016 0500   CO2 24 05/03/2016 0500   GLUCOSE 110 (H) 05/09/2016 0500   BUN 60 (H) 05/03/2016 0500   CREATININE 1.21 (H) 05/06/2016 0500   CALCIUM 7.7 (L) 04/23/2016 0500   GFRNONAA 42 (L) 04/20/2016 0500   GFRAA 49 (L) 05/10/2016 0500    INR    Component Value Date/Time   INR 1.19 05/07/2016 0500     Intake/Output Summary (Last 24 hours) at 04/26/2016 0920 Last data filed at 04/25/2016 0800  Gross per 24 hour  Intake          4597.38 ml  Output             2485 ml  Net          2112.38 ml     Assessment:  75 y.o. female is s/p:  Exploratory laparotomy, lysis of adhesions, aorto to superior mesenteric artery bypass using reversed right greater saphenous vein (Felds) & small bowel resection without anastomosis and placement fo wound vac. 6 Days Post-Op And Exploratory laparotomy with small bowel resection x 2 &  placement of abdominal wound vac 4 Days Post-Op Small bowel resection, anastomosis and re-application of abdominal wound vac 3 Days Post-Op  Plan: -pt returning back to the OR today with surgery -there continues to be a continuous bloody/serosanginous drainage from the distal portion of the proximal incision.  Hgb unchanged.  Continue current care.  Otherwise, leg incision looks okay with staples in tact.  -leukocytosis slowly improving    [name], PA-C [facility] [phone] 04/22/2016 9:20 AM   Several staples removed from right groin incision old blood and serous fluid evacuated Will do local wound care Seems to be doing ok from mesenteric bypass as bowel viable and overall improving Will see again Monday  Dr Scot Dock here this weekend if questions Family updated today.  Ruta Hinds, MD [facility] [place] Office: [phone] Pager: [phone]

## 2016-04-21 NOTE — Op Note (Signed)
OPERATIVE REPORT  DATE OF OPERATION: 05/18/2016  PATIENT:  Amy [name]  75 y.o. female  PRE-OPERATIVE DIAGNOSIS:  Repeat exploratory laparotomy  POST-OPERATIVE DIAGNOSIS:  Viable bowel, no enteric leakage or ischemia  FINDINGS:  Normal bowel without evidence of ischemia or necrosis.  No bleeding.  Bypass intact  PROCEDURE:  Procedure(s): EXPLORATORY LAPAROTOMY,  CLOSURE OF ABDOMEN  SURGEON:  Surgeon(s): Judeth Horn, MD  ASSISTANT: None  ANESTHESIA:   general  COMPLICATIONS:  None  EBL: <2- ml  BLOOD ADMINISTERED: none  DRAINS: Subcutaneous negative pressure wound dressing.   SPECIMEN:  No Specimen  COUNTS CORRECT:  YES  PROCEDURE DETAILS: The patient was taken to the operating room and placed on the table in supine position. She was directly brought in from the intensive care unit intubated and sedated.  A proper timeout was performed identifying the patient and the procedure to be performed. The outer portion of her negative pressure wound dressing was removed and she was prepped with Betadine in the usual sterile manner. Once were prepped and draped the inner portion of the negative pressure wound dressing was removed revealing the underlying bowel. Ran the small bowel from the ligament of Treitz down to the terminal ileum going through to bowel anastomoses on the way where they appeared to be intact and viable. We also inspected the previous: Anastomosis which appeared to be intact and viable.  We washed out the abdomen with saline solution then closed the abdomen using interrupted #1 Novafil sutures. A subcutaneous negative pressure wound dressing was placed. All counts were correct including needles, sponges, and instruments.  PATIENT DISPOSITION:  ICU - intubated and critically ill.   Anibal Quinby 8/4/201712:27 PM

## 2016-04-21 NOTE — Transfer of Care (Signed)
Immediate Anesthesia Transfer of Care Note  Patient: Amy [name]  Procedure(s) Performed: Procedure(s): EXPLORATORY LAPAROTOMY,  CLOSURE OF ABDOMEN (N/A)  Patient Location: SICU  Anesthesia Type:General  Level of Consciousness: Patient remains intubated per anesthesia plan  Airway & Oxygen Therapy: Patient remains intubated per anesthesia plan and Patient placed on Ventilator (see vital sign flow sheet for setting)  Post-op Assessment: Report given to RN and Post -op Vital signs reviewed and stable  Post vital signs: Reviewed and stable  Last Vitals:  Vitals:   05/13/2016 0900 04/20/2016 0905  BP:  131/77  Pulse: 84 88  Resp: 15 13  Temp:      Last Pain:  Vitals:   04/29/2016 0823  TempSrc: Axillary  PainSc:       Patients Stated Pain Goal: 0 (83/15/17 6160)  Complications: No apparent anesthesia complications

## 2016-04-21 NOTE — Progress Notes (Addendum)
Versed '2mg'$ , Succ '10mg'$ , lido '5mg'$ , wasted with Benjaman Pott, RN after placement of trach. Kathleen Argue S 3:50 PM  Jerline Pain Anselmo Pickler

## 2016-04-21 NOTE — Progress Notes (Signed)
PARENTERAL NUTRITION CONSULT NOTE - FOLLOW UP  Pharmacy Consult for TPN Indication: massive bowel resection  Patient Measurements: Height: '5\' 6"'$  (167.6 cm) Weight: 153 lb 3.5 oz (69.5 kg) IBW/kg (Calculated) : 59.3 Adjusted Body Weight: 63.9 kg  Vital Signs: Temp: 97.6 F (36.4 C) (08/04 0403) Temp Source: Oral (08/04 0403) BP: 125/48 (08/03 2041) Pulse Rate: 82 (08/04 0700) Intake/Output from previous day: 08/03 0701 - 08/04 0700 In: 4711.1 [I.V.:4571.1; NG/GT:90; IV Piggyback:50] Out: 2660 [Urine:810; Emesis/NG output:350; Drains:1500] Intake/Output from this shift: No intake/output data recorded.   Insulin Requirements in the past 24 hours:  1  units of SSI  Assessment: 75 yo F w/ bowel ischemia, SMA thrombus, s/p OR 7/29 w/ mesenteric artery bypass and SBR, OR 7/30 2 more short segments of small bowel removed- per CCS note overall most of small bowel viable, SMA graft pulse palpable. Plan back to OR likely Tues.  To start TPN for severe protein calorie malnutrition. Critically ill on fent, phenyephrine and vasopressin drips and getting FFP, PRBCs.  GI: Post op day #3 after ex lap, artery bypass and lysis of adhesions. OR visit on 8/1 found no frankly necrotic bowel, small portion resected with plans to bring back to OR on 8/4 for possible abdominal closure.  NG output 350 ml & drain output 1.5L yesterday. Albumin low down to 1.3, prealbumin down 15 >> 5.7 in 5 days. Last BM was on 7/27.  Endo: CBGs controlled on sensitive SSI. Hypoglycemic event on 7/31. Pepcid in TPN  Lytes: wnl today. CoCa 9.9. Phos elevated at 4.7 (CaxPhos ~48) and Mg 1.9. Has PRN replacements ordered for Mg and K.  Renal: SCr up to 1.21 today, CrCl ~35-64m/min. UOP trending down, 0.5 ml/kg/hr yesterday - Net + 14L this admission. 8/3 RN noted weeping edema of RUE.   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 Intubated with Fi02 of 40%  Cards: BP low on phenylephrine gtt at 358m/min, HR 80-90s  in Afib. Last CVP 7. 7/24 TTE- EF 50-55%, mild MR regurg.    Hepatobil: AST and ALT elevated due to shock liver - slowly resolving.TBili elevated and up to 3.7 (slight jaundice noted on eye exam with RN today). Trig wnl.  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 On fentanyl at 35066mhr. Seemed agitated yesterday so goal RASS changed to -2. Current RASS -2. GCS 10- not following commands. 8/2 EEG- global encephalopathic process, non-specific etiology.   ID: S/P 10 days of vancomycin for MRSA PNA. Now on ceftriaxone d#4. Afebrile, WBC overall down but to 31.  Best Practices: SCDs, famotidine TPN Access: PICC triple lumen TPN start date: 7/30 >>  Current Nutrition: NPO Clinimix E 5/15 at 36m32m (Provides 96g of protein and 1363 kcals per day - >90% of protein needs and >100% of kcal needs) IVF- D5 + 1/2NS @ 75ml70m(initiated 8/3 AM) >> reducing to 50ml/49mer Dr. FeinstTitus Moulditional Goals: per RD recs on 8/2 Kcal: 1287 Protein: 105-115 g F/U with RD recs  Plan:  1) Continue Clinimix 5/15 at 36ml/h35mNo electrolytes today due to elevated Phos and worsening renal function. Meeting >90% protein and >100% of kcal needs - unable to meet 100% protein needs Clinimix due to hypocaloric goal.  2) Hold 20% lipid emulsion for first 7 days for ICU patients per ASPEN guidelines (Start date 8/6). 3) Removing TE from TPN today due to rising Tbili and suspected jaundice on eye exam 4) Add MVI  in TPN 5) Decrease famotidine to '20mg'$   in TPN 6) Discussed IV fluids with Dr. Titus Mould - plan to decrease IV Fluids to D5-1/2NS @ 50 ml/hr & keep TPN rate the same.  7) Replace Magnesium 1g today due to level <2 and removing electrolytes from TPN today 8) Continue sensitive SSI and adjust as needed -Monitor TPN labs, daily Mg and Phos -F/U plans after trip to OR today  Sloan Leiter, PharmD, BCPS Clinical Pharmacist [phone] 05/15/2016 7:20 AM

## 2016-04-21 NOTE — Progress Notes (Signed)
PULMONARY / CRITICAL CARE MEDICINE   Name: Amy [name] MRN: [id number] DOB: [birth year]    ADMISSION DATE:  03/26/2016 CONSULTATION DATE:  7/23  REFERRING MD:  [facility] hospital per family request   CHIEF COMPLAINT:  Acute encephalopathy and progressive respiratory failure   HISTORY OF PRESENT ILLNESS:   This is a 75 year old white female who was initially admitted to [facility] hospital where she underwent an elective loop colostomy takedown on 7/18. Her immediate post-op course was c/b lethargy and actually required narcan in PACU. She never really improved w/ hospital notes continuing to raise concern for altered mental status and difficulty to arouse. She ultimately required emergent intubation for hypercarbia (CO2 52) and inability to protect airway. She was transferred per family request on 7/23 for further evaluation and care. MRI confirmed new CVA. Suspect this was multifactorial: CVA, meds, +/- pneumonia. She was extubated on 7/24 and transferred to the SDU. On 7/28, she developed worsening abdominal pain and leukocytosis. 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 On 7/29, she underwent aorto to superior mesenteric artery bypass with greater saphenous vein graft and small bowel resection. In the immediate post-op period, she became hypotensive to the 50s and was started on pressors. She was transferred back to [facility] service.  SUBJECTIVE:  Plan OR today at 9 am, no sbt  VITAL SIGNS: BP (!) 125/48   Pulse 82   Temp 97.6 F (36.4 C) (Oral)   Resp 18   Ht '5\' 6"'$  (1.676 m)   Wt 69.5 kg (153 lb 3.5 oz)   SpO2 100%   BMI 24.73 kg/m   HEMODYNAMICS: CVP:  [6 mmHg-7 mmHg] 7 mmHg  VENTILATOR SETTINGS: Vent Mode: PRVC FiO2 (%):  [40 %] 40 % Set Rate:  [14 bmp] 14 bmp Vt Set:  [480 mL] 480 mL PEEP:  [5 cmH20] 5 cmH20 Plateau Pressure:  [19 cmH20-23 cmH20] 20 cmH20  INTAKE / OUTPUT: I/O last 3 completed shifts: In: 6231.1 [I.V.:6061.1;  NG/GT:120; IV Piggyback:50] Out: 9937 [Urine:1385; Emesis/NG output:350; Drains:2150]  PHYSICAL EXAMINATION: General: Sedated. No acute distress. Family at bedside.  Integument: Abdominal wound VAC dry HEENT:  Line clean, jvd wnl Cardiovascular: Regular rate, normal S1 & S2 Pulmonary: distant clear, slight coarse anterior Abdomen: Soft. No bowel sounds. Nondistended. , vac clean Neurological: Sedated, not fc  LABS:  BMET  Recent Labs Lab 04/19/16 0430 04/20/16 0415 05/07/2016 0500  NA 142 140 137  K 4.6 4.6 4.8  CL 114* 110 109  CO2 '25 26 24  '$ BUN 41* 51* 60*  CREATININE 0.95 1.12* 1.21*  GLUCOSE 117* 113* 110*    Electrolytes  Recent Labs Lab 04/19/16 0430 04/20/16 0415 05/02/2016 0500  CALCIUM 7.6* 7.7* 7.7*  MG 1.9 2.0 1.9  PHOS 3.2 4.0 4.7*    CBC  Recent Labs Lab 04/19/16 0430 04/20/16 0415 04/29/2016 0500  WBC 34.4* 31.0* 27.7*  HGB 9.4* 9.0* 9.1*  HCT 29.2* 28.5* 29.0*  PLT 70* 76* 81*    Coag's  Recent Labs Lab 04/19/16 0430 04/20/16 0415 04/25/2016 0500  APTT 34 36 39*  INR 1.32 1.30 1.19    Sepsis Markers  Recent Labs Lab 03/27/2016 1455 03/19/2016 1814 04/17/16 1653  LATICACIDVEN 7.6* 4.3* 1.3    ABG  Recent Labs Lab 04/10/2016 0410 04/23/2016 0342 04/19/16 0432  PHART 7.366 7.372 7.341*  PCO2ART 40.4 52.2* 48.1*  PO2ART 110.0* 132.0* 135.0*    Liver Enzymes  Recent Labs Lab 04/19/16  0430 04/20/16 0415 05/03/2016 0500  AST 113* 112* 109*  ALT 79* 72* 67*  ALKPHOS 98 118 129*  BILITOT 3.0* 3.3* 3.7*  ALBUMIN 1.3* 1.3* 1.3*    Cardiac Enzymes No results for input(s): TROPONINI, PROBNP in the last 168 hours.  Glucose  Recent Labs Lab 04/20/16 0759 04/20/16 1250 04/20/16 1548 04/20/16 1933 04/20/16 2345 05/17/2016 0347  GLUCAP 113* 112* 117* 109* 124* 102*    Imaging No results found. STUDIES:  CT head (at [facility]): negative  MRI brain  7/23: Bilateral cerebral hemispheres and tight cerebellar hemisphere  patchy acute and early subacute infarcts. No mass effect or hemorrhage. Central thromboembolic source is suspected.  EEG 7/23: Abnormal due to moderate diffuse slowing of the waking background. No seizure or epiletiform discharges.  TTE 7/24: EF 50-55%, lateral wall appears hypokinetic, systolic function normal, mild MR regurg Carotid US 7/24: right mild soft plaque CCA, mild to moderate plaque origin and proximal ICA and ECA 1-39% ICA stenosis.  EEG 8/2>>>enceph, no focus  MICROBIOLOGY: MRSA PCR 7/23:  Positive  Urine Ctx 7/23:  Negative  Blood Ctx x2 7/23:  Negative  Tracheal Asp Ctx 7/23:  MRSA Blood Ctx x2 7/28>>> NGTD Urine Ctx 7/28:  Negative  ANTIBIOTICS: Vancomycin 7/23 >>>10 days  Meropenem 7/23 >> 8/1 Diflucan 7/27 >>>8/2 Ceftriaxone 8/1 >>>  SIGNIFICANT EVENTS: Loop colostomy takedown 7/18 7/18-7/22: progressively lethargic  7/23 transferred to [facility] s/p getting intubated for AMS and hypercarbia. Surgical service consulted at [facility] on arrival  7/24 on CCB gtt for af. But fully awake and following commands. Extubated. 7/25 SLP evaluation for diet, ASA started, FEES 7/28 developed worsening abdominal pain, leukocytosis, CT showing complete SMA occlusion 7/29 Underwent aorto to SMA bypass with greater saphenous vein graft and small bowel resection 7/29 Developed hypotension to the 50s in post-op period, started on pressors 7/29 Code status changed to DNR 7/30 Ex Lap by CCS w/ necrotic bowel & ischemia s/p resection w/ intact bypass & oozing over peritoneal surfaces. 8/1 OR, limited small bowel resection, anastamosis x2, vascular graft intact 8/3 neo needed again 8/4 - OR trip  LINES/TUBES: OETT 7/23>>>7/24; 7/29 >> R IJ CVL 7/19 >> L Radial Art Line 7/29 >> 8/1 patient pulled R Radial Art Line 8/1 >>>plan to dc post op 8 /4 FOLEY 7/29 >> R NGT 7/29 >> IABP 7/29 >>?  ASSESSMENT / PLAN:  PULMONARY A: Acute Hypercarbic Respiratory Failure Acute Hypoxic Respiratory  Failure MRSA Pneumonia H/O COPD H/O OSA (intolerant of CPAP) H/O RUL Lobectomy - For NSCLC. P:   abdo remains open with vac No sbt planned Pos balance goals NO SBT with open abdo remains an issue If abdo remains open , may consider some form of sbt PS 15  CARDIOVASCULAR A:  Shock -SIRS / sedation Atrial Fibrillation Elevated Troponin I - Likely demand ischemia. 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 Bowel Ischemia - S/P resection 7/29 & further resection 7/30. Shock Liver H/O GERD  P:   TPN d/w pharmacy Pepcid IV q12hr, follow plat count OR today this am   RENAL A:   ARF, ATN most likely as pos 2 liters with continued rise R/o insensible losses P:   Allow pos balance again Total intake hourly with tpn  goal 100-125 Chem in am  Have concerns with arf, would be a horrible candidate CVVHD  HEMATOLOGIC A:   DIC/Coagulopathy - S/P Massive Transfusion. S/P 8u FFP 7/30 & 8u FFP 7/29. Anemia - Secondary to acute blood loss. S/P 4u PRBC 7/30 & 8u PRBC 7/29. Leukocytosis - Secondary to Sepsis / SIRS Thrombocytopenia - Secondary to consumption w/ DIC. S/P net 3u plts Embolic CVA  P:  Cbc in am, WBC down slight SCD  INFECTIOUS A:   Sepsis MRSA Pneumonia- possible At risk abdo contam P:   Keep ceftriaxone, d/w CCS  ENDOCRINE A:   H/O Hypothyroidism (TSH 28.2) Hypoglycemia, borderline at times  P:   Levothyroxine 33mg IV daily Continuing D5 1/2NS to 50 Continuing Accu-Checks q4hr SSI limit  NEUROLOGIC A:   Sedation on Ventilator Post-operative Pain 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   P:   Goal RASS: -2 Fentanyl gtt & IV prn Pain Versed IV prn  Re assess with wua post op  FAMILY UPDATE:  I have updated family  Ccm time 35 min  DLavon Paganini FTitus Mould MD, FOtisPgr: 3[facility] 05/11/2016 8:24 AM

## 2016-04-21 NOTE — Anesthesia Preprocedure Evaluation (Signed)
Anesthesia Evaluation  Patient identified by MRN, date of birth, ID band Patient unresponsive    Reviewed: Allergy & Precautions, NPO status , Patient's Chart, lab work & pertinent test results, Unable to perform ROS - Chart review only  Airway       Comment: Currently intubated with 7.45m Dental   Pulmonary sleep apnea , COPD,           Cardiovascular + Peripheral Vascular Disease       Neuro/Psych PSYCHIATRIC DISORDERS CVA    GI/Hepatic GERD  ,  Endo/Other  Hypothyroidism   Renal/GU      Musculoskeletal   Abdominal   Peds  Hematology   Anesthesia Other Findings   Reproductive/Obstetrics                             Anesthesia Physical Anesthesia Plan  ASA:   Anesthesia Plan:    Post-op Pain Management:    Induction:   Airway Management Planned:   Additional Equipment:   Intra-op Plan:   Post-operative Plan:   Informed Consent:   History available from chart only  Plan Discussed with: Anesthesiologist, CRNA and Surgeon  Anesthesia Plan Comments:         Anesthesia Quick Evaluation

## 2016-04-21 NOTE — Progress Notes (Signed)
Dr. Lake Bells notified about UOP of 10 cc/hour. Order received to increase IV fluids to 100/hour. Will implement and continue to monitor closely.

## 2016-04-21 NOTE — Progress Notes (Signed)
[facility] Progress Note 3 Days Post-Op  Subjective: Patient doing the same.  Still agitated and not fully awake.  Objective: Vital signs in last 24 hours: Temp:  [97.6 F (36.4 C)-98.7 F (37.1 C)] 97.6 F (36.4 C) (08/04 0403) Pulse Rate:  [44-109] 82 (08/04 0700) Resp:  [10-25] 18 (08/04 0700) BP: (87-125)/(46-57) 125/48 (08/03 2041) SpO2:  [97 %-100 %] 100 % (08/04 0820) Arterial Line BP: (80-147)/(44-115) 101/54 (08/04 0700) FiO2 (%):  [40 %] 40 % (08/04 0820) Weight:  [69.5 kg (153 lb 3.5 oz)] 69.5 kg (153 lb 3.5 oz) (08/04 0500) Last BM Date:  (Unknown)  Intake/Output from previous day: 08/03 0701 - 08/04 0700 In: 4711.1 [I.V.:4571.1; NG/GT:90; IV Piggyback:50] Out: 2660 [Urine:810; Emesis/NG output:350; Drains:1500] Intake/Output this shift: No intake/output data recorded.  General: No real changes  Lungs: Clear  Abd: 1500 cc from [facility] yesterday.  Serous and not enteric.  Extremities: Edematous all over and oozing a lot of serosanguinous fluid from right groin incision from graft site  Neuro: Agitated, not responsive.  Delirious.   Recent Labs  04/20/16 0415 05/18/2016 0500  NA 140 137  K 4.6 4.8  CL 110 109  CO2 26 24  GLUCOSE 113* 110*  BUN 51* 60*  CREATININE 1.12* 1.21*  CALCIUM 7.7* 7.7*   PT/INR  Recent Labs  04/20/16 0415 05/08/2016 0500  LABPROT 16.3* 15.2  INR 1.30 1.19   ABG  Recent Labs  04/19/16 0432  PHART 7.341*  HCO3 26.1*    Studies/Results: No results found.  Anti-infectives: Anti-infectives    Start     Dose/Rate Route Frequency Ordered Stop   04/27/2016 1000  cefTRIAXone (ROCEPHIN) 2 g in dextrose 5 % 50 mL IVPB     2 g 100 mL/hr over 30 Minutes Intravenous Every 24 hours 05/16/2016 0846     04/17/16 1900  vancomycin (VANCOCIN) 500 mg in sodium chloride 0.9 % 100 mL IVPB     500 mg 100 mL/hr over 60 Minutes Intravenous Every 24 hours 04/17/16 1748 04/29/2016 1924   04/13/16 2130  fluconazole (DIFLUCAN) IVPB 200 mg   Status:  Discontinued     200 mg 100 mL/hr over 60 Minutes Intravenous Every 24 hours 04/13/16 2033 04/19/16 0939   04/13/16 1700  vancomycin (VANCOCIN) IVPB 750 mg/150 ml premix  Status:  Discontinued     750 mg 150 mL/hr over 60 Minutes Intravenous Every 24 hours 04/12/16 1814 04/17/16 1748   04/12/16 2000  fluconazole (DIFLUCAN) tablet 200 mg  Status:  Discontinued     200 mg Oral Daily 04/12/16 1758 04/13/16 2028   04/12/16 1400  meropenem (MERREM) 1 g in sodium chloride 0.9 % 100 mL IVPB  Status:  Discontinued     1 g 200 mL/hr over 30 Minutes Intravenous Every 8 hours 04/12/16 1150 05/03/2016 0846   04/10/16 0500  meropenem (MERREM) 1 g in sodium chloride 0.9 % 100 mL IVPB  Status:  Discontinued     1 g 200 mL/hr over 30 Minutes Intravenous Every 12 hours 04/11/2016 1654 04/12/16 1150   04/10/16 0500  vancomycin (VANCOCIN) 500 mg in sodium chloride 0.9 % 100 mL IVPB  Status:  Discontinued     500 mg 100 mL/hr over 60 Minutes Intravenous Every 12 hours 04/14/2016 1654 04/12/16 1814   04/07/2016 1700  meropenem (MERREM) 1 g in sodium chloride 0.9 % 100 mL IVPB     1 g 200 mL/hr over 30 Minutes Intravenous  Once 04/07/2016 1638 04/01/2016 1730  04/06/2016 1700  vancomycin (VANCOCIN) IVPB 1000 mg/200 mL premix  Status:  Discontinued     1,000 mg 200 mL/hr over 60 Minutes Intravenous  Once 03/25/2016 1638 04/14/2016 1641   03/27/2016 1700  vancomycin (VANCOCIN) 1,250 mg in sodium chloride 0.9 % 250 mL IVPB     1,250 mg 166.7 mL/hr over 90 Minutes Intravenous  Once 04/02/2016 1641 04/01/2016 1900      Assessment/Plan: s/p Procedure(s): SMALL BOWEL RESECTION, ANASTOMSIS AND RE-APPLICATION OF WOUND VAC OR today  Possible loop ostomy of some type.  LOS: 12 days   [name]. Dahlia Bailiff, MD, FACS [phone] [facility] Surgery 05/04/2016

## 2016-04-21 NOTE — OR Nursing (Signed)
[place] notified of closing '@1120'$  per [facility], on transport call '@1142'$ 

## 2016-04-21 NOTE — Progress Notes (Signed)
[place] Progress Note Patient Name: Amy [name] DOB: 1941/01/03 MRN: [id number]   Date of Service  04/29/2016  HPI/Events of Note  oliguria  eICU Interventions  500cc saline bolus     Intervention Category Intermediate Interventions: Oliguria - evaluation and management  [name] 05/14/2016, 6:48 PM

## 2016-04-21 NOTE — Anesthesia Preprocedure Evaluation (Addendum)
Anesthesia Evaluation  Patient identified by MRN, date of birth, ID band Patient awake    Reviewed: Allergy & Precautions, NPO status , Patient's Chart, lab work & pertinent test results  Airway Mallampati: II  TM Distance: >3 FB Neck ROM: Full    Dental no notable dental hx.    Pulmonary sleep apnea , pneumonia, COPD,    breath sounds clear to auscultation   + intubated    Cardiovascular + Peripheral Vascular Disease  Normal cardiovascular exam Rhythm:Regular Rate:Normal  On phenylephrine at 15 mcg/minute.   Neuro/Psych PSYCHIATRIC DISORDERS CVA    GI/Hepatic Neg liver ROS, GERD  ,  Endo/Other  Hypothyroidism   Renal/GU negative Renal ROS  negative genitourinary   Musculoskeletal negative musculoskeletal ROS (+)   Abdominal   Peds negative pediatric ROS (+)  Hematology  (+) anemia ,   Anesthesia Other Findings ------------------------------------------------------------------- Study Conclusions  - Left ventricle: overall poor endocardial definition posterior   lateral wall appears hypokinetic. The cavity size was normal.   Wall thickness was normal. Systolic function was normal. The   estimated ejection fraction was in the range of 50% to 55%. - Mitral valve: Calcified annulus. Mildly thickened leaflets .   There was mild regurgitation. - Pericardium, extracardiac: There was a left pleural effusion.   Reproductive/Obstetrics negative OB ROS                          Anesthesia Physical Anesthesia Plan  ASA: III  Anesthesia Plan: General   Post-op Pain Management:    Induction: Intravenous  Airway Management Planned: Oral ETT  Additional Equipment:   Intra-op Plan:   Post-operative Plan: Extubation in OR  Informed Consent: I have reviewed the patients History and Physical, chart, labs and discussed the procedure including the risks, benefits and alternatives for the  proposed anesthesia with the patient or authorized representative who has indicated his/her understanding and acceptance.   Dental advisory given  Plan Discussed with: CRNA  Anesthesia Plan Comments:         Anesthesia Quick Evaluation

## 2016-04-21 NOTE — Progress Notes (Signed)
Nutrition Follow-up  DOCUMENTATION CODES:   Not applicable  INTERVENTION:    Continue TPN dosing per Pharmacy.  NUTRITION DIAGNOSIS:   Inadequate oral intake related to inability to eat as evidenced by NPO status.  Ongoing  GOAL:   Patient will meet greater than or equal to 90% of their needs  Met  MONITOR:   Vent status, Labs, Weight trends, Skin, I & O's  REASON FOR ASSESSMENT:   Consult New TPN/TNA  ASSESSMENT:   75 y.o. Female who presented for evaluation of abdominal pain x 1 day. 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 Her KUB was suggestive of an ileus. She is s/p elective colostomy takedown at [facility] on 04/04/16. Her post-op course was complicated altered mental status, lethargy and respiratory distress requiring emergent intubation. She was transferred to [facility] on 04/10/2016 per family's request.    Patient s/p procedure 7/30: EXPLORATORY LAPAROTOMY SMALL BOWEL RESECTION x 2 PLACEMENT OF ABDOMINAL WOUND VAC Labs reviewed: phosphorus elevated. Medications reviewed and include Solumedrol, Novolog.  Patient is currently in the OR for possible loop ostomy. Patient is receiving TPN with Clinimix E 5/15 @ 80 ml/hr. Lipids being held. Provides 1920 ml, 1363 kcal, and 96 grams protein per day. Meets 100% minimum estimated energy needs and 91% minimum estimated protein needs.  Patient is currently intubated on ventilator support MV: 8.1 L/min Temp (24hrs), Avg:98.1 F (36.7 C), Min:97.6 F (36.4 C), Max:98.7 F (37.1 C)   Diet Order:  Diet NPO time specified .TPN (CLINIMIX-E) Adult TPN (CLINIMIX) Adult without lytes  Skin:   VAC to abdominal wound  Last BM:  7/27  Height:   Ht Readings from Last 1 Encounters:  04/08/2016 5' 6"  (1.676 m)    Weight:   Wt Readings from Last 1 Encounters:  04/26/2016 153 lb 3.5 oz (69.5 kg)    Ideal Body Weight:  59 kg  BMI:  Body mass index is 24.73 kg/m.  Estimated  Nutritional Needs:   Kcal:  1287  Protein:  105-115 gm  Fluid:  per MD  EDUCATION NEEDS:   No education needs identified at this time  Molli Barrows, [place], [place], [place] Pager [phone] After Hours Pager [phone]

## 2016-04-21 NOTE — Procedures (Signed)
Name:  Amy [name] MRN:  [id number] DOB:  Jul 26, 1941  OPERATIVE NOTE  Procedure:  Percutaneous tracheostomy.  Indications:  Ventilator-dependent respiratory failure.  Consent:  Procedure, alternatives, risks and benefits discussed with medical POA.  Questions answered.  Consent obtained.  Anesthesia:  Versed, fent drip and bolus , etomidate total 20 mg divided  Procedure summary:  Appropriate equipment was assembled.  The patient was identified as Radiographer, therapeutic and safety timeout was performed. The patient was placed in supine position with a towel roll behind shoulder blades and neck extended.  Sterile technique was used. The patient's neck and upper chest were prepped using chlorhexidine / alcohol scrub and the field was draped in usual sterile fashion with full body drape. After the adequate sedation / anesthesia was achieved, attention was directed at the midline trachea, where the cricothyroid membrane was palpated. Approximately two fingerbreadths above the sternal notch, a verticle incision was created with a scalpel after local infiltration with 0.2% Lidocaine. Then, using Seldinger technique and a percutaneous tracheostomy set, the trachea was entered with a 14 gauge needle with an overlying sheath. This was all confirmed under direct visualization of a fiberoptic flexible bronchoscope. Entrance into the trachea was identified through the third tracheal ring interspace. Following this, a guidewire was inserted. The needle was removed, leaving the sheath and the guidewire intact. Next, the sheath was removed and a small dilator was inserted. The tracheal rings were then dilated. A #6 Shiley was then opened. The balloon was checked. It was placed over a tracheal dilator, which was then advanced over the guidewire and through the previously dilated tract. The Shiley tracheostomy tube was noted to pass in the trachea with little resistance. The guidewire and dilator tubes were removed from the  trachea. An inner cannula was placed through the tracheostomy tube. The tracheostomy was then secured at the anterior neck with 4 monofilament sutures. The oral endotracheal tube was removed and the ventilator was attached to the newly placed tracheostomy tube. Adequate tidal volumes were noted. The cuff was inflated and no evidence of air leak was noted. No evidence of bleeding was noted. At this point, the procedure was concluded. Post-procedure chest x-ray was ordered.  Complications:  No immediate complications were noted.  Hemodynamic parameters and oxygenation remained stable throughout the procedure.  Estimated blood loss:  Less then 1 mL.  Raylene Miyamoto., MD Pulmonary and [place] Pager: [phone]  05/17/2016, 2:29 PM  Can follow up in trach clinic [phone]

## 2016-04-21 NOTE — Progress Notes (Signed)
Arterial line dressing changed. Weeping edema

## 2016-04-21 NOTE — Plan of Care (Signed)
Problem: Activity: Goal: Risk for activity intolerance will decrease Outcome: Not Progressing On bedrest. Remains intubated  Problem: Nutrition: Goal: Adequate nutrition will be maintained Outcome: Progressing On TNA  Problem: Bowel/Gastric: Goal: Will not experience complications related to bowel motility Outcome: Not Progressing To OR for abdominal closure today  Problem: Cardiac: Goal: Ability to maintain an adequate cardiac output will improve Outcome: Progressing Chronic Afib. On low dose pressor Goal: Will show no evidence of cardiac arrhythmias Outcome: Progressing See previous  Problem: Respiratory: Goal: Ability to maintain adequate ventilation will improve Outcome: Not Progressing Remains intubated Goal: Respiratory status will improve Outcome: Not Progressing No SBT until abdominal closure

## 2016-04-21 NOTE — Anesthesia Postprocedure Evaluation (Signed)
Anesthesia Post Note  Patient: Amy [name]  Procedure(s) Performed: Procedure(s) (LRB): EXPLORATORY LAPAROTOMY,  CLOSURE OF ABDOMEN (N/A)  Patient location during evaluation: SICU Anesthesia Type: General Level of consciousness: sedated Pain management: pain level controlled Vital Signs Assessment: post-procedure vital signs reviewed and stable Respiratory status: respiratory function stable and patient remains intubated per anesthesia plan Cardiovascular status: blood pressure returned to baseline and stable Postop Assessment: no signs of nausea or vomiting Anesthetic complications: no    Last Vitals:  Vitals:   05/04/2016 1200 05/15/2016 1215  BP:  (!) 164/92  Pulse:  (!) 40  Resp:  14  Temp: 36.4 C     Last Pain:  Vitals:   04/25/2016 1200  TempSrc: Oral  PainSc:                  [name] J

## 2016-04-21 NOTE — Procedures (Signed)
Bronchoscopy Procedure Note Amy [name] [id number] 1940/11/06  Procedure: Bronchoscopy Indications: Diagnostic evaluation of the airways  Procedure Details Consent: Risks of procedure as well as the alternatives and risks of each were explained to the (patient/caregiver).  Consent for procedure obtained. Time Out: Verified patient identification, verified procedure, site/side was marked, verified correct patient position, special equipment/implants available, medications/allergies/relevent history reviewed, required imaging and test results available.  Performed  In preparation for procedure, patient was given 100% FiO2 and bronchoscope lubricated. Sedation: Benzodiazepines and Etomidate  Airway entered and the following bronchi were examined: RUL, RML, RLL, LUL, LLL and Bronchi.   Bronchoscope removed.    Evaluation Hemodynamic Status: BP stable throughout; O2 sats: stable throughout Patient's Current Condition: stable Specimens:  None Complications: No apparent complications Patient did tolerate procedure well.   Jennet Maduro 05/09/2016

## 2016-04-21 NOTE — Procedures (Signed)
Bedside Tracheostomy Insertion Procedure Note   Patient Details:   Name: Amy [name] DOB: Jun 11, 1941 MRN: [id number]  Procedure: Tracheostomy  Pre Procedure Assessment: ET Tube Size:7.0 ET Tube secured at lip (cm):24 Bite block in place: No Breath Sounds: Diminished  Post Procedure Assessment: BP (!) 164/92 (BP Location: Right Arm)   Pulse (!) 40   Temp 97.6 F (36.4 C) (Oral)   Resp 14   Ht '5\' 6"'$  (1.676 m)   Wt 153 lb 3.5 oz (69.5 kg)   SpO2 100%   BMI 24.73 kg/m  O2 sats: stable throughout Complications: No apparent complications Patient did tolerate procedure well Tracheostomy Brand:Shiley Tracheostomy Style:Cuffed Tracheostomy Size: 6.0 cuffed Tracheostomy Secured SEL:[id number], velcro Tracheostomy Placement Confirmation:Trach cuff visualized and in place, cxr taken for placement    Amy [name] 05/09/2016, 2:51 PM

## 2016-04-22 LAB — HEPATIC FUNCTION PANEL
ALBUMIN: 1 g/dL — AB (ref 3.5–5.0)
ALK PHOS: 128 U/L — AB (ref 38–126)
ALT: 51 U/L (ref 14–54)
AST: 78 U/L — ABNORMAL HIGH (ref 15–41)
Bilirubin, Direct: 2 mg/dL — ABNORMAL HIGH (ref 0.1–0.5)
Indirect Bilirubin: 0.9 mg/dL (ref 0.3–0.9)
Total Bilirubin: 2.9 mg/dL — ABNORMAL HIGH (ref 0.3–1.2)

## 2016-04-22 LAB — FIBRINOGEN: Fibrinogen: 653 mg/dL — ABNORMAL HIGH (ref 210–475)

## 2016-04-22 LAB — GLUCOSE, CAPILLARY
GLUCOSE-CAPILLARY: 120 mg/dL — AB (ref 65–99)
GLUCOSE-CAPILLARY: 120 mg/dL — AB (ref 65–99)
Glucose-Capillary: 131 mg/dL — ABNORMAL HIGH (ref 65–99)
Glucose-Capillary: 149 mg/dL — ABNORMAL HIGH (ref 65–99)
Glucose-Capillary: 159 mg/dL — ABNORMAL HIGH (ref 65–99)
Glucose-Capillary: 92 mg/dL (ref 65–99)
Glucose-Capillary: 98 mg/dL (ref 65–99)

## 2016-04-22 LAB — CBC WITH DIFFERENTIAL/PLATELET
BASOS ABS: 0 10*3/uL (ref 0.0–0.1)
BASOS PCT: 0 %
EOS PCT: 0 %
Eosinophils Absolute: 0 10*3/uL (ref 0.0–0.7)
HEMATOCRIT: 31.4 % — AB (ref 36.0–46.0)
HEMOGLOBIN: 10.1 g/dL — AB (ref 12.0–15.0)
LYMPHS ABS: 0.8 10*3/uL (ref 0.7–4.0)
Lymphocytes Relative: 2 %
MCH: 30.2 pg (ref 26.0–34.0)
MCHC: 32.2 g/dL (ref 30.0–36.0)
MCV: 94 fL (ref 78.0–100.0)
MONOS PCT: 2 %
Monocytes Absolute: 0.8 10*3/uL (ref 0.1–1.0)
NEUTROS ABS: 37.5 10*3/uL — AB (ref 1.7–7.7)
Neutrophils Relative %: 96 %
Platelets: 104 10*3/uL — ABNORMAL LOW (ref 150–400)
RBC: 3.34 MIL/uL — ABNORMAL LOW (ref 3.87–5.11)
RDW: 16.9 % — ABNORMAL HIGH (ref 11.5–15.5)
WBC: 39.1 10*3/uL — ABNORMAL HIGH (ref 4.0–10.5)

## 2016-04-22 LAB — PROTIME-INR
INR: 1.18
Prothrombin Time: 15.1 seconds (ref 11.4–15.2)

## 2016-04-22 LAB — PHOSPHORUS: PHOSPHORUS: 4.2 mg/dL (ref 2.5–4.6)

## 2016-04-22 LAB — APTT: APTT: 37 s — AB (ref 24–36)

## 2016-04-22 LAB — MAGNESIUM: MAGNESIUM: 2 mg/dL (ref 1.7–2.4)

## 2016-04-22 MED ORDER — STERILE WATER FOR INJECTION IJ SOLN
INTRAMUSCULAR | Status: AC
  Administered 2016-04-22: 10 mL
  Filled 2016-04-22: qty 10

## 2016-04-22 MED ORDER — HYDROCORTISONE NA SUCCINATE PF 100 MG IJ SOLR: 50.0000 mg | Freq: Two times a day (BID) | INTRAMUSCULAR | Status: DC

## 2016-04-22 MED ORDER — FENTANYL CITRATE (PF) 100 MCG/2ML IJ SOLN
25.0000 ug | INTRAMUSCULAR | Status: DC | PRN
  Administered 2016-04-22: 50 ug via INTRAVENOUS
  Administered 2016-04-22 – 2016-04-24 (×9): 100 ug via INTRAVENOUS
  Administered 2016-04-24 (×2): 50 ug via INTRAVENOUS
  Administered 2016-04-24: 100 ug via INTRAVENOUS
  Administered 2016-04-25: 50 ug via INTRAVENOUS
  Administered 2016-04-25 (×3): 100 ug via INTRAVENOUS
  Administered 2016-04-25 (×2): 50 ug via INTRAVENOUS
  Administered 2016-04-26 (×2): 100 ug via INTRAVENOUS
  Administered 2016-04-26: 50 ug via INTRAVENOUS
  Administered 2016-04-27: 25 ug via INTRAVENOUS
  Administered 2016-04-27: 50 ug via INTRAVENOUS
  Administered 2016-04-27 – 2016-04-30 (×3): 100 ug via INTRAVENOUS
  Filled 2016-04-22 (×28): qty 2

## 2016-04-22 MED ORDER — FUROSEMIDE 10 MG/ML IJ SOLN
40.0000 mg | Freq: Once | INTRAMUSCULAR | Status: AC
  Administered 2016-04-22: 40 mg via INTRAVENOUS
  Filled 2016-04-22: qty 4

## 2016-04-22 MED ORDER — TRACE MINERALS CR-CU-MN-SE-ZN 10-1000-500-60 MCG/ML IV SOLN
INTRAVENOUS | Status: AC
  Administered 2016-04-22: 18:00:00 via INTRAVENOUS
  Filled 2016-04-22: qty 1920

## 2016-04-22 NOTE — Progress Notes (Signed)
[facility] Progress Note 1 Day Post-Op  Subjective: Patient seems to be resting well. Has followed some commands.  Urine output down and WBC elevated.  Objective: Vital signs in last 24 hours: Temp:  [97.6 F (36.4 C)-98.2 F (36.8 C)] 98 F (36.7 C) (08/05 0403) Pulse Rate:  [37-112] 112 (08/05 0724) Resp:  [10-24] 18 (08/05 0724) BP: (79-191)/(37-117) 191/73 (08/05 0724) SpO2:  [96 %-100 %] 100 % (08/05 0725) Arterial Line BP: (64-241)/(36-121) 141/55 (08/05 0715) FiO2 (%):  [40 %] 40 % (08/05 0724) Weight:  [73.1 kg (161 lb 2.5 oz)] 73.1 kg (161 lb 2.5 oz) (08/05 0330) Last BM Date:  (Prior to surgery)  Intake/Output from previous day: 08/04 0701 - 08/05 0700 In: 5119.2 [I.V.:4789.2; NG/GT:180; IV Piggyback:150] Out: 930 [Urine:520; Emesis/NG output:160; Drains:250] Intake/Output this shift: No intake/output data recorded.  General: No resting distress and oly on FIO2 40%  Lungs: Clear.  Has  New trach.  Sats good on FIO2 40%  Abd: Soft, tender to deep palpation.  VAC is fine.  Minimal output from VAC.    Extremities: Edematous  Neuro: Following some commands.  Lab Results:  '@LABLAST2'$ (wbc:2,hgb:2,hct:2,plt:2) BMET ) Recent Labs  04/20/16 0415 04/28/2016 0500  NA 140 137  K 4.6 4.8  CL 110 109  CO2 26 24  GLUCOSE 113* 110*  BUN 51* 60*  CREATININE 1.12* 1.21*  CALCIUM 7.7* 7.7*   PT/INR  Recent Labs  04/22/2016 0500 04/22/16 0330  LABPROT 15.2 15.1  INR 1.19 1.18   ABG  Recent Labs  05/17/2016 1436 05/14/2016 1535  PHART 7.383 7.302*  HCO3 25.3* 22.5    Studies/Results: Dg Chest Port 1 View  Result Date: 04/20/2016 CLINICAL DATA:  Evaluate tracheostomy tube placement EXAM: PORTABLE CHEST 1 VIEW COMPARISON:  04/19/2016 FINDINGS: The endotracheal tube tip is above the carina. There is a left arm PICC line with tip in the projection of the cavoatrial junction. Right IJ catheter tip is also in the projection of the cavoatrial junction. Normal heart  size. Moderate bilateral pleural effusions are identified right greater than left. These appears similar to previous exam. IMPRESSION: 1. The tracheostomy tube tip is in satisfactory position above the carina. 2. Moderate bilateral pleural effusions appear similar to previous exam. Electronically Signed   By: Kerby Moors M.D.   On: 05/18/2016 14:50    Anti-infectives: Anti-infectives    Start     Dose/Rate Route Frequency Ordered Stop   04/23/2016 1015  cefTRIAXone (ROCEPHIN) 2 g in dextrose 5 % 50 mL IVPB     2 g 100 mL/hr over 30 Minutes Intravenous  Once 04/28/2016 1010 04/28/2016 1045   05/03/2016 1000  cefTRIAXone (ROCEPHIN) 2 g in dextrose 5 % 50 mL IVPB     2 g 100 mL/hr over 30 Minutes Intravenous Every 24 hours 05/05/2016 0846     04/17/16 1900  vancomycin (VANCOCIN) 500 mg in sodium chloride 0.9 % 100 mL IVPB     500 mg 100 mL/hr over 60 Minutes Intravenous Every 24 hours 04/17/16 1748 05/17/2016 1924   04/13/16 2130  fluconazole (DIFLUCAN) IVPB 200 mg  Status:  Discontinued     200 mg 100 mL/hr over 60 Minutes Intravenous Every 24 hours 04/13/16 2033 04/19/16 0939   04/13/16 1700  vancomycin (VANCOCIN) IVPB 750 mg/150 ml premix  Status:  Discontinued     750 mg 150 mL/hr over 60 Minutes Intravenous Every 24 hours 04/12/16 1814 04/17/16 1748   04/12/16 2000  fluconazole (DIFLUCAN) tablet 200  mg  Status:  Discontinued     200 mg Oral Daily 04/12/16 1758 04/13/16 2028   04/12/16 1400  meropenem (MERREM) 1 g in sodium chloride 0.9 % 100 mL IVPB  Status:  Discontinued     1 g 200 mL/hr over 30 Minutes Intravenous Every 8 hours 04/12/16 1150 05/08/2016 0846   04/10/16 0500  meropenem (MERREM) 1 g in sodium chloride 0.9 % 100 mL IVPB  Status:  Discontinued     1 g 200 mL/hr over 30 Minutes Intravenous Every 12 hours 03/19/2016 1654 04/12/16 1150   04/10/16 0500  vancomycin (VANCOCIN) 500 mg in sodium chloride 0.9 % 100 mL IVPB  Status:  Discontinued     500 mg 100 mL/hr over 60 Minutes  Intravenous Every 12 hours 04/10/2016 1654 04/12/16 1814   04/05/2016 1700  meropenem (MERREM) 1 g in sodium chloride 0.9 % 100 mL IVPB     1 g 200 mL/hr over 30 Minutes Intravenous  Once 03/29/2016 1638 03/24/2016 1730   03/22/2016 1700  vancomycin (VANCOCIN) IVPB 1000 mg/200 mL premix  Status:  Discontinued     1,000 mg 200 mL/hr over 60 Minutes Intravenous  Once 04/02/2016 1638 04/14/2016 1641   04/10/2016 1700  vancomycin (VANCOCIN) 1,250 mg in sodium chloride 0.9 % 250 mL IVPB     1,250 mg 166.7 mL/hr over 90 Minutes Intravenous  Once 04/01/2016 1641 03/24/2016 1900      Assessment/Plan: s/p Procedure(s): EXPLORATORY LAPAROTOMY,  CLOSURE OF ABDOMEN Continue ABX therapy due to Post-op infection  WBC elevated, but it was as high as 34K 3 days ago.  Postop bump expected to some degree.  Will recheck tomorrow. Minimal VAC output. DC IJ line and arterial line.  LOS: 13 days   [name]. Dahlia Bailiff, MD, FACS [phone] [facility] 04/22/2016

## 2016-04-22 NOTE — Progress Notes (Signed)
Dr. Jimmy Footman made aware of pt's UOP 10-15 cc/hour via camera rounding. No new orders at this time, will continue to monitor.

## 2016-04-22 NOTE — Progress Notes (Signed)
PARENTERAL NUTRITION CONSULT NOTE - FOLLOW UP  Pharmacy Consult for TPN Indication: massive bowel resection  Patient Measurements: Height: '5\' 6"'$  (167.6 cm) Weight: 161 lb 2.5 oz (73.1 kg) IBW/kg (Calculated) : 59.3 Adjusted Body Weight: 63.9 kg  Vital Signs: Temp: 98 F (36.7 C) (08/05 0403) Temp Source: Oral (08/05 0403) BP: 138/96 (08/05 0700) Pulse Rate: 82 (08/05 0715) Intake/Output from previous day: 08/04 0701 - 08/05 0700 In: 5119.2 [I.V.:4789.2; NG/GT:180; IV Piggyback:150] Out: 930 [Urine:520; Emesis/NG output:160; Drains:250] Intake/Output from this shift: No intake/output data recorded.   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 Plan back to OR likely Tues.  To start TPN for severe protein calorie malnutrition. Critically ill on fent, phenyephrine and vasopressin drips and getting FFP, PRBCs.  GI: Post op day #1 after second ex lap and abdominal closure. NG output 350 ml & drain output 1.5L yesterday. Albumin low down to 1.3, prealbumin down to 5.7. Last BM was on 7/27.  Endo: CBGs controlled (<180) on sensitive SSI. Hypoglycemic event on 7/31. Started on hydrocortisone on 8/4. Pepcid in TPN  Lytes: wnl today. Phos down to 4.2. CoCa 9.9. (CaxPhos ~42) and Mg 2.0. Has PRN replacements ordered for Mg and K.  Renal: SCr up to 1.21, CrCl ~35-64m/min. UOP trending down, 0.3 ml/kg/hr yesterday - Net + 18L this admission. 8/3 RN noted weeping edema of RUE. D51/2NS increased last night up to 1063mhr due to low UOP? Discussed with Dr. McLake Bellsnd will stop fluids this morning, giving a trial of lasix.  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 Intubated with Fi02 of 40%  Cards: BP ok on phenylephrine gtt at 2085mmin,  HR 80-90s in Afib. Last CVP 7. 7/24 TTE- EF 50-55%, mild MR regurg.    Hepatobil: AST and ALT elevated due to shock liver - slowly resolving.TBili elevated and up to 3.7 (slight jaundice noted on eye exam with RN). Trig wnl.  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 Fentanyl gtt titrated off. GCS down to 8, RASS -1 (goal 0) 8/2 EEG- global encephalopathic process, non-specific etiology.   ID: S/P 10 days of vancomycin for MRSA PNA. Now on ceftriaxone d#5. Afebrile, WBC overall down but still elevated at 39.1  Best Practices: SCDs, famotidine TPN Access: PICC triple lumen TPN start date: 7/30 >>  Current Nutrition: NPO Clinimix E 5/15 at 70m46m (Provides 96g of protein and 1363 kcals per day - >90% of protein needs and >100% of kcal needs) IVF- D5 + 1/2NS @ 100ml61m(stopped morning of 8/5)  Nutritional Goals: per RD recs on 8/4 Kcal: 1287 Protein: 105-115 g F/U with RD recs  Plan:  Continue Clinimix 5/15 (w/o lytes) at 70ml/54mold 20% lipid emulsion for first 7 days for ICU patients per ASPEN guidelines (Start date 8/6) After discussion with Dr. [place] Bellsstop D51/2NS this morning TPN provides 96 g of protein and 1363 kCals per day meeting ~90% of protein and > 100% of kCal needs Add MVI and half TE (jaundice on exam with elevated Tbili) in TPN Continue famotidine '20mg'$  in TPN Continue sensitive SSI and adjust as needed Monitor TPN labs, daily Mg and Phos, Bmet tomorrow, CBGs closely F/U improvement after massive bowel resection  NathanElenor Quinones  PharmD, BCPS Clinical Pharmacist Pager [phone] 04/22/2016 7:37 AM

## 2016-04-22 NOTE — Progress Notes (Signed)
PULMONARY / CRITICAL CARE MEDICINE   Name: Amy [name] MRN: [id number] DOB: [birth year]    ADMISSION DATE:  04/14/2016 CONSULTATION DATE:  7/23  REFERRING MD:  [facility] hospital per family request   CHIEF COMPLAINT:  Acute encephalopathy and progressive respiratory failure    HISTORY OF PRESENT ILLNESS:   This is a 75 year old white female who was initially admitted to [facility] hospital where she underwent an elective loop colostomy takedown on 7/18. Her immediate post-op course was c/b lethargy and actually required narcan in PACU. She never really improved w/ hospital notes continuing to raise concern for altered mental status and difficulty to arouse. She ultimately required emergent intubation for hypercarbia (CO2 52) and inability to protect airway. She was transferred per family request on 7/23 for further evaluation and care. MRI confirmed new CVA. Suspect this was multifactorial: CVA, meds, +/- pneumonia. She was extubated on 7/24 and transferred to the SDU. On 7/28, she developed worsening abdominal pain and leukocytosis. 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 On 7/29, she underwent aorto to superior mesenteric artery bypass with greater saphenous vein graft and small bowel resection. In the immediate post-op period, she became hypotensive to the 50s and was started on pressors. She was transferred back to [facility] service. After multiple surgeries her abdomen was closed on 8/4 as her gut appeared healthy at that point, tracheostomy placed on 8/4.  SUBJECTIVE:   her abdomen was closed on 8/4 as her gut appeared healthy at that point, tracheostomy placed on 8/4.  Low urine output overnight  WBC up 39k   VITAL SIGNS: BP (!) 138/96   Pulse 82   Temp 98 F (36.7 C) (Oral)   Resp 18   Ht '5\' 6"'$  (1.676 m)   Wt 73.1 kg (161 lb 2.5 oz)   SpO2 100%   BMI 26.01 kg/m   HEMODYNAMICS:    VENTILATOR SETTINGS: Vent Mode: PRVC FiO2 (%):  [40  %] 40 % Set Rate:  [14 bmp] 14 bmp Vt Set:  [480 mL] 480 mL PEEP:  [5 cmH20] 5 cmH20 Plateau Pressure:  [18 cmH20-23 cmH20] 20 cmH20  INTAKE / OUTPUT: I/O last 3 completed shifts: In: 7499.8 [I.V.:7169.8; NG/GT:180; IV Piggyback:150] Out: 2300 [Urine:940; Emesis/NG output:360; Drains:1000]  PHYSICAL EXAMINATION: General: on vent, no distress HENT: tracheostomy clean, dry PULM: CTA B, vent supported breaths CV: RRR, no mgr GI: midline scar with wound vac Derm: massive anasarca, some scattered bruising, scleral icterus? Neuro: Opens eyes to voice but doesn't follow commands  LABS:  BMET  Recent Labs Lab 04/19/16 0430 04/20/16 0415 04/19/2016 0500  NA 142 140 137  K 4.6 4.6 4.8  CL 114* 110 109  CO2 '25 26 24  '$ BUN 41* 51* 60*  CREATININE 0.95 1.12* 1.21*  GLUCOSE 117* 113* 110*    Electrolytes  Recent Labs Lab 04/19/16 0430 04/20/16 0415 05/15/2016 0500 04/22/16 0330  CALCIUM 7.6* 7.7* 7.7*  --   MG 1.9 2.0 1.9 2.0  PHOS 3.2 4.0 4.7* 4.2    CBC  Recent Labs Lab 04/20/16 0415 05/17/2016 0500 04/22/16 0330  WBC 31.0* 27.7* 39.1*  HGB 9.0* 9.1* 10.1*  HCT 28.5* 29.0* 31.4*  PLT 76* 81* 104*    Coag's  Recent Labs Lab 04/20/16 0415 05/15/2016 0500 04/22/16 0330  APTT 36 39* 37*  INR 1.30 1.19 1.18    Sepsis Markers  Recent Labs Lab 03/26/2016 1455 04/10/2016 1814 04/17/16 1653  LATICACIDVEN 7.6*  4.3* 1.3    ABG  Recent Labs Lab 04/19/16 0432 05/11/2016 1436 05/04/2016 1535  PHART 7.341* 7.383 7.302*  PCO2ART 48.1* 42.4 45.6*  PO2ART 135.0* 505.0* 129.0*    Liver Enzymes  Recent Labs Lab 04/19/16 0430 04/20/16 0415 04/20/2016 0500  AST 113* 112* 109*  ALT 79* 72* 67*  ALKPHOS 98 118 129*  BILITOT 3.0* 3.3* 3.7*  ALBUMIN 1.3* 1.3* 1.3*    Cardiac Enzymes No results for input(s): TROPONINI, PROBNP in the last 168 hours.  Glucose  Recent Labs Lab 04/29/2016 0835 05/18/2016 1211 05/13/2016 1627 04/26/2016 1957 05/06/2016 2337  04/22/16 0359  GLUCAP 102* 150* 181* 158* 149* 120*    Imaging Dg Chest Port 1 View  Result Date: 05/02/2016 CLINICAL DATA:  Evaluate tracheostomy tube placement EXAM: PORTABLE CHEST 1 VIEW COMPARISON:  04/19/2016 FINDINGS: The endotracheal tube tip is above the carina. There is a left arm PICC line with tip in the projection of the cavoatrial junction. Right IJ catheter tip is also in the projection of the cavoatrial junction. Normal heart size. Moderate bilateral pleural effusions are identified right greater than left. These appears similar to previous exam. IMPRESSION: 1. The tracheostomy tube tip is in satisfactory position above the carina. 2. Moderate bilateral pleural effusions appear similar to previous exam. Electronically Signed   By: Kerby Moors M.D.   On: 05/14/2016 14:50   STUDIES:  CT head (at [facility]): negative  MRI brain  7/23: Bilateral cerebral hemispheres and tight cerebellar hemisphere patchy acute and early subacute infarcts. No mass effect or hemorrhage. Central thromboembolic source is suspected.  EEG 7/23: Abnormal due to moderate diffuse slowing of the waking background. No seizure or epiletiform discharges.  TTE 7/24: EF 50-55%, lateral wall appears hypokinetic, systolic function normal, mild MR regurg Carotid US 7/24: right mild soft plaque CCA, mild to moderate plaque origin and proximal ICA and ECA 1-39% ICA stenosis.  EEG 8/2>>>enceph, no focus  MICROBIOLOGY: MRSA PCR 7/23:  Positive  Urine Ctx 7/23:  Negative  Blood Ctx x2 7/23:  Negative  Tracheal Asp Ctx 7/23:  MRSA Blood Ctx x2 7/28>>> NGTD Urine Ctx 7/28:  Negative  ANTIBIOTICS: Vancomycin 7/23 >>>10 days  Meropenem 7/23 >> 8/1 Diflucan 7/27 >>>8/2 Ceftriaxone 8/1 >>>  SIGNIFICANT EVENTS: Loop colostomy takedown 7/18 7/18-7/22: progressively lethargic  [facility]/p getting intubated for AMS and hypercarbia. [facility] on [facility]  7/24 on CCB gtt for af.  But fully awake and following commands. Extubated. 7/25 SLP evaluation for diet, ASA started, FEES 7/28 developed worsening abdominal pain, leukocytosis, CT showing complete SMA occlusion 7/29 Underwent aorto to SMA bypass with greater saphenous vein graft and small bowel resection 7/29 Developed hypotension to the 50s in post-op period, started on pressors 7/29 Code status changed to DNR 7/30 Ex Lap by CCS w/ necrotic bowel & ischemia s/p resection w/ intact bypass & oozing over peritoneal surfaces. 8/1 OR, limited small bowel resection, anastamosis x2, vascular graft intact 8/3 neo needed again 8/4 - belly closed, Tracheostomy placed, low UOP  LINES/TUBES: OETT 7/23>>>7/24; 7/29 >>8/4 R IJ CVL 7/19 >> 8/5 L Radial Art Line 7/29 >> 8/1 patient pulled R Radial Art Line 8/1 >>>plan to dc post op 8 /4 FOLEY 7/29 >> R NGT 7/29 >> IABP 7/29 >> out (date?) Tracheostomy 8/4 >>  ASSESSMENT / PLAN:  PULMONARY A: Acute Hypercarbic Respiratory Failure Acute Hypoxic Respiratory Failure MRSA Pneumonia H/O COPD H/O OSA (intolerant of CPAP) H/O RUL Lobectomy -  For NSCLC P:   SBT today VAP prevention Trach care  CARDIOVASCULAR A:  Shock -SIRS / sedation > resolving Atrial Fibrillation Elevated Troponin I - Likely demand ischemia H/O HTN  H/O Bilateral ICA Stenosis  P:  Neo-Synephrine, titrate to map >60 Tele D/c arterial line Wean hydrocortisone  GASTROINTESTINAL A:   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 H/O GERD  P:   TPN d/w pharmacy Pepcid IV q12hr, follow plat count Send LFT Nutrition per surgery  RENAL A:   ARF, ATN most likely as pos 2 liters with continued rise, oliguria 8/4 Anasarca P:   D/C IVF Lasix today Continue foley Monitor UOP  HEMATOLOGIC A:   Leukocytosis > steroid related? Anemia improving Thrombocytopenia improving  P:  Monitor for  bleeding Transfuse for Hgb < 10  INFECTIOUS A:   WBC up 8/5, source? MRSA Pneumonia- possible At risk abdo contam P:   Keep ceftriaxone again 8/5 Remove CVL today, arterial line out Wean solumedrol Culture if febrile  ENDOCRINE A:   H/O Hypothyroidism (TSH 28.2) P:   Levothyroxine 25mg IV daily Stop D5 Continuing Accu-Checks q4hr SSI   NEUROLOGIC A:   Encephalopathy> ICU related, medication related Post-operative Pain 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   P:   Goal RASS: 0 Fentanyl IV prn only D/c versed   FAMILY UPDATE: Husband updated 8/5  Ccm time 37 min  BRoselie Awkward MD [place] [facility] Pager: [phone]: [phone] 3pm or if no response, call [phone] 04/22/2016 8:00 AM

## 2016-04-23 LAB — MAGNESIUM: Magnesium: 1.8 mg/dL (ref 1.7–2.4)

## 2016-04-23 LAB — PROTIME-INR
INR: 1.21
Prothrombin Time: 15.4 seconds — ABNORMAL HIGH (ref 11.4–15.2)

## 2016-04-23 LAB — GLUCOSE, CAPILLARY
GLUCOSE-CAPILLARY: 101 mg/dL — AB (ref 65–99)
GLUCOSE-CAPILLARY: 106 mg/dL — AB (ref 65–99)
GLUCOSE-CAPILLARY: 114 mg/dL — AB (ref 65–99)
Glucose-Capillary: 122 mg/dL — ABNORMAL HIGH (ref 65–99)
Glucose-Capillary: 122 mg/dL — ABNORMAL HIGH (ref 65–99)

## 2016-04-23 LAB — FIBRINOGEN: Fibrinogen: 631 mg/dL — ABNORMAL HIGH (ref 210–475)

## 2016-04-23 LAB — CBC WITH DIFFERENTIAL/PLATELET
BASOS ABS: 0 10*3/uL (ref 0.0–0.1)
Basophils Relative: 0 %
EOS PCT: 0 %
Eosinophils Absolute: 0 10*3/uL (ref 0.0–0.7)
HEMATOCRIT: 27.1 % — AB (ref 36.0–46.0)
HEMOGLOBIN: 8.7 g/dL — AB (ref 12.0–15.0)
LYMPHS ABS: 0.4 10*3/uL — AB (ref 0.7–4.0)
LYMPHS PCT: 1 %
MCH: 29.9 pg (ref 26.0–34.0)
MCHC: 32.1 g/dL (ref 30.0–36.0)
MCV: 93.1 fL (ref 78.0–100.0)
MONOS PCT: 4 %
Monocytes Absolute: 1.4 10*3/uL — ABNORMAL HIGH (ref 0.1–1.0)
NEUTROS ABS: 34.4 10*3/uL — AB (ref 1.7–7.7)
Neutrophils Relative %: 95 %
Platelets: 119 10*3/uL — ABNORMAL LOW (ref 150–400)
RBC: 2.91 MIL/uL — AB (ref 3.87–5.11)
RDW: 16.7 % — ABNORMAL HIGH (ref 11.5–15.5)
WBC: 36.2 10*3/uL — AB (ref 4.0–10.5)

## 2016-04-23 LAB — TYPE AND SCREEN
ABO/RH(D): O POS
Antibody Screen: NEGATIVE
DONOR AG TYPE: NEGATIVE
Donor AG Type: NEGATIVE
UNIT DIVISION: 0
UNIT DIVISION: 0
UNIT DIVISION: 0
Unit division: 0

## 2016-04-23 LAB — BASIC METABOLIC PANEL
ANION GAP: 6 (ref 5–15)
BUN: 89 mg/dL — AB (ref 6–20)
CHLORIDE: 103 mmol/L (ref 101–111)
CO2: 20 mmol/L — ABNORMAL LOW (ref 22–32)
Calcium: 7.8 mg/dL — ABNORMAL LOW (ref 8.9–10.3)
Creatinine, Ser: 1.71 mg/dL — ABNORMAL HIGH (ref 0.44–1.00)
GFR, EST AFRICAN AMERICAN: 32 mL/min — AB (ref >60)
GFR, EST NON AFRICAN AMERICAN: 28 mL/min — AB (ref >60)
Glucose, Bld: 110 mg/dL — ABNORMAL HIGH (ref 65–99)
POTASSIUM: 4.7 mmol/L (ref 3.5–5.1)
SODIUM: 129 mmol/L — AB (ref 135–145)

## 2016-04-23 LAB — PHOSPHORUS: PHOSPHORUS: 4 mg/dL (ref 2.5–4.6)

## 2016-04-23 LAB — APTT: APTT: 35 s (ref 24–36)

## 2016-04-23 MED ORDER — TRACE MINERALS CR-CU-MN-SE-ZN 10-1000-500-60 MCG/ML IV SOLN
INTRAVENOUS | Status: AC
  Administered 2016-04-23: 17:00:00 via INTRAVENOUS
  Filled 2016-04-23: qty 1800

## 2016-04-23 MED ORDER — FUROSEMIDE 10 MG/ML IJ SOLN
60.0000 mg | Freq: Once | INTRAMUSCULAR | Status: AC
  Administered 2016-04-23: 60 mg via INTRAVENOUS
  Filled 2016-04-23: qty 6

## 2016-04-23 MED ORDER — FAT EMULSION 20 % IV EMUL
240.0000 mL | INTRAVENOUS | Status: AC
  Administered 2016-04-23: 240 mL via INTRAVENOUS
  Filled 2016-04-23: qty 250

## 2016-04-23 NOTE — Progress Notes (Signed)
Progress Note: General Surgery Service   Subjective: No acute events  Objective: Vital signs in last 24 hours: Temp:  [97.1 F (36.2 C)-98.5 F (36.9 C)] 98 F (36.7 C) (08/06 0826) Pulse Rate:  [73-124] 96 (08/06 0900) Resp:  [13-32] 24 (08/06 0900) BP: (97-191)/(48-143) 137/101 (08/06 0900) SpO2:  [98 %-100 %] 100 % (08/06 0900) FiO2 (%):  [40 %] 40 % (08/06 0721) Weight:  [72.8 kg (160 lb 7.9 oz)] 72.8 kg (160 lb 7.9 oz) (08/06 0300) Last BM Date:  (Prior to surgery)  Intake/Output from previous day: 08/05 0701 - 08/06 0700 In: 1950 [I.V.:1920; NG/GT:30] Out: 1330 [Urine:880; Emesis/NG output:200; Drains:250] Intake/Output this shift: No intake/output data recorded.  Lungs: coarse b/l, trach in place  Cardiovascular: irreg irreg  Abd: soft, edematous, vac in place and functional  Extremities: edematous throughout  Neuro: GCS 7T  Lab Results: CBC   Recent Labs  04/22/16 0330 04/23/16 0415  WBC 39.1* 36.2*  HGB 10.1* 8.7*  HCT 31.4* 27.1*  PLT 104* 119*   BMET  Recent Labs  05/11/2016 0500 04/23/16 0415  NA 137 129*  K 4.8 4.7  CL 109 103  CO2 24 20*  GLUCOSE 110* 110*  BUN 60* 89*  CREATININE 1.21* 1.71*  CALCIUM 7.7* 7.8*   PT/INR  Recent Labs  04/22/16 0330 04/23/16 0415  LABPROT 15.1 15.4*  INR 1.18 1.21   ABG  Recent Labs  05/05/2016 1436 05/15/2016 1535  PHART 7.383 7.302*  HCO3 25.3* 22.5    Studies/Results:  Anti-infectives: Anti-infectives    Start     Dose/Rate Route Frequency Ordered Stop   05/15/2016 1015  cefTRIAXone (ROCEPHIN) 2 g in dextrose 5 % 50 mL IVPB     2 g 100 mL/hr over 30 Minutes Intravenous  Once 04/22/2016 1010 04/20/2016 1045   05/14/2016 1000  cefTRIAXone (ROCEPHIN) 2 g in dextrose 5 % 50 mL IVPB     2 g 100 mL/hr over 30 Minutes Intravenous Every 24 hours 04/20/2016 0846     04/17/16 1900  vancomycin (VANCOCIN) 500 mg in sodium chloride 0.9 % 100 mL IVPB     500 mg 100 mL/hr over 60 Minutes Intravenous  Every 24 hours 04/17/16 1748 05/12/2016 1924   04/13/16 2130  fluconazole (DIFLUCAN) IVPB 200 mg  Status:  Discontinued     200 mg 100 mL/hr over 60 Minutes Intravenous Every 24 hours 04/13/16 2033 04/19/16 0939   04/13/16 1700  vancomycin (VANCOCIN) IVPB 750 mg/150 ml premix  Status:  Discontinued     750 mg 150 mL/hr over 60 Minutes Intravenous Every 24 hours 04/12/16 1814 04/17/16 1748   04/12/16 2000  fluconazole (DIFLUCAN) tablet 200 mg  Status:  Discontinued     200 mg Oral Daily 04/12/16 1758 04/13/16 2028   04/12/16 1400  meropenem (MERREM) 1 g in sodium chloride 0.9 % 100 mL IVPB  Status:  Discontinued     1 g 200 mL/hr over 30 Minutes Intravenous Every 8 hours 04/12/16 1150 05/10/2016 0846   04/10/16 0500  meropenem (MERREM) 1 g in sodium chloride 0.9 % 100 mL IVPB  Status:  Discontinued     1 g 200 mL/hr over 30 Minutes Intravenous Every 12 hours 03/27/2016 1654 04/12/16 1150   04/10/16 0500  vancomycin (VANCOCIN) 500 mg in sodium chloride 0.9 % 100 mL IVPB  Status:  Discontinued     500 mg 100 mL/hr over 60 Minutes Intravenous Every 12 hours 04/06/2016 1654 04/12/16 1814  03/30/2016 1700  meropenem (MERREM) 1 g in sodium chloride 0.9 % 100 mL IVPB     1 g 200 mL/hr over 30 Minutes Intravenous  Once 04/13/2016 1638 03/23/2016 1730   03/28/2016 1700  vancomycin (VANCOCIN) IVPB 1000 mg/200 mL premix  Status:  Discontinued     1,000 mg 200 mL/hr over 60 Minutes Intravenous  Once 04/14/2016 1638 04/10/2016 1641   03/19/2016 1700  vancomycin (VANCOCIN) 1,250 mg in sodium chloride 0.9 % 250 mL IVPB     1,250 mg 166.7 mL/hr over 90 Minutes Intravenous  Once 04/13/2016 1641 04/07/2016 1900      Medications: Scheduled Meds: . antiseptic oral rinse  7 mL Mouth Rinse QID  . budesonide (PULMICORT) nebulizer solution  0.5 mg Nebulization BID  . cefTRIAXone (ROCEPHIN)  IV  2 g Intravenous Q24H  . chlorhexidine  15 mL Mouth Rinse BID  . Chlorhexidine Gluconate Cloth  6 each Topical Daily  . insulin aspart   0-9 Units Subcutaneous Q4H  . ipratropium-albuterol  3 mL Nebulization Q6H  . levothyroxine  44 mcg Intravenous Daily  . mupirocin ointment  1 application Nasal BID  . sodium chloride flush  10-40 mL Intracatheter Q12H  . sodium chloride flush  10-40 mL Intracatheter Q12H  . sodium chloride flush  10-40 mL Intracatheter Q12H   Continuous Infusions: . TPN (CLINIMIX) Adult without lytes     And  . fat emulsion    . phenylephrine (NEO-SYNEPHRINE) Adult infusion Stopped (04/22/16 1238)  . TPN (CLINIMIX) Adult without lytes 80 mL/hr at 04/23/16 0700   PRN Meds:.sodium chloride, acetaminophen **OR** acetaminophen, albuterol, fentaNYL (SUBLIMAZE) injection, guaiFENesin-dextromethorphan, HYDROmorphone (DILAUDID) injection, labetalol, magnesium sulfate 1 - 4 g bolus IVPB, ondansetron, ondansetron, phenol, potassium chloride, promethazine, RESOURCE THICKENUP CLEAR, sodium chloride flush, sodium chloride flush, sodium chloride flush  Assessment/Plan: Patient Active Problem List   Diagnosis Date Noted  . Acute weakness   . Cerebrovascular accident (CVA) due to thrombosis of precerebral artery ([place])   . Abdominal pain   . Pneumonia of both upper lobes due to methicillin resistant Staphylococcus aureus (MRSA) ([place])   . Ischemic bowel disease ([place])   . HCAP (healthcare-associated pneumonia)   . Carotid artery stenosis   . Oral candidiasis   . Ileus, postoperative   . Pleural effusion   . History of CVA (cerebrovascular accident)   . COPD exacerbation ([place])   . OSA (obstructive sleep apnea)   . Supplemental oxygen dependent   . Paroxysmal atrial fibrillation ([facility])   . Dysphagia, post-stroke   . Hx of colostomy   . Tachycardia   . Atrial fibrillation with RVR ([place])   . Acute blood loss anemia   . Leukocytosis   . Acute respiratory failure ([place])   . Cerebrovascular accident (CVA) due to bilateral embolism of carotid arteries   . Acute encephalopathy 04/03/2016   s/p  Procedure(s): EXPLORATORY LAPAROTOMY,  CLOSURE OF ABDOMEN 04/28/2016 -continue TPN -continue wound vac -continue NG tube -await return of bowel function   LOS: 14 days   Mickeal Skinner, MD Pg# [phone] [facility]

## 2016-04-23 NOTE — Progress Notes (Signed)
PULMONARY / CRITICAL CARE MEDICINE   Name: Amy [name] MRN: [id number] DOB: April 16, 1941    ADMISSION DATE:  03/24/2016 CONSULTATION DATE:  7/23  REFERRING MD:  [facility] hospital per family request   CHIEF COMPLAINT:  Acute encephalopathy and progressive respiratory failure   HISTORY OF PRESENT ILLNESS:   This is a 75 year old white female who was initially admitted to [facility] hospital where she underwent an elective loop colostomy takedown on 7/18. Her immediate post-op course was c/b lethargy and actually required narcan in PACU. She never really improved w/ hospital notes continuing to raise concern for altered mental status and difficulty to arouse. She ultimately required emergent intubation for hypercarbia (CO2 52) and inability to protect airway. She was transferred per family request on 7/23 for further evaluation and care. MRI confirmed new CVA. Suspect this was multifactorial: CVA, meds, +/- pneumonia. She was extubated on 7/24 and transferred to the SDU. On 7/28, she developed worsening abdominal pain and leukocytosis. 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 On 7/29, she underwent aorto to superior mesenteric artery bypass with greater saphenous vein graft and small bowel resection. In the immediate post-op period, she became hypotensive to the 50s and was started on pressors. She was transferred back to [facility] service. After multiple surgeries her abdomen was closed on 8/4 as her gut appeared healthy at that point, tracheostomy placed on 8/4.  SUBJECTIVE:   UOP up WBC down slightly Didn't wean   VITAL SIGNS: BP (!) 145/76   Pulse 98   Temp 97.4 F (36.3 C) (Axillary)   Resp 14   Ht '5\' 6"'$  (1.676 m)   Wt 72.8 kg (160 lb 7.9 oz)   SpO2 100%   BMI 25.90 kg/m   HEMODYNAMICS:    VENTILATOR SETTINGS: Vent Mode: PRVC FiO2 (%):  [40 %] 40 % Set Rate:  [14 bmp] 14 bmp Vt Set:  [480 mL] 480 mL PEEP:  [5 cmH20] 5 cmH20 Plateau  Pressure:  [13 cmH20-20 cmH20] 20 cmH20  INTAKE / OUTPUT: I/O last 3 completed shifts: In: 8527 [I.V.:4183; NG/GT:120] Out: 7824 [Urine:1150; Emesis/NG output:260; Drains:300]  PHYSICAL EXAMINATION: General: on vent, no distress HENT: tracheostomy clean, dry PULM: CTA B, vent supported breaths CV: RRR, no mgr GI: midline scar with wound vac Derm: massive anasarca, some scattered bruising, scleral icterus? Neuro: Opens eyes to voice, more awake 04/23/2016  LABS:  BMET  Recent Labs Lab 04/20/16 0415 04/24/2016 0500 04/23/16 0415  NA 140 137 129*  K 4.6 4.8 4.7  CL 110 109 103  CO2 26 24 20*  BUN 51* 60* 89*  CREATININE 1.12* 1.21* 1.71*  GLUCOSE 113* 110* 110*    Electrolytes  Recent Labs Lab 04/20/16 0415 05/13/2016 0500 04/22/16 0330 04/23/16 0415  CALCIUM 7.7* 7.7*  --  7.8*  MG 2.0 1.9 2.0 1.8  PHOS 4.0 4.7* 4.2 4.0    CBC  Recent Labs Lab 05/17/2016 0500 04/22/16 0330 04/23/16 0415  WBC 27.7* 39.1* 36.2*  HGB 9.1* 10.1* 8.7*  HCT 29.0* 31.4* 27.1*  PLT 81* 104* 119*    Coag's  Recent Labs Lab 05/16/2016 0500 04/22/16 0330 04/23/16 0415  APTT 39* 37* 35  INR 1.19 1.18 1.21    Sepsis Markers  Recent Labs Lab 04/17/16 1653  LATICACIDVEN 1.3    ABG  Recent Labs Lab 04/19/16 0432 05/09/2016 1436 05/15/2016 1535  PHART 7.341* 7.383 7.302*  PCO2ART 48.1* 42.4 45.6*  PO2ART 135.0* 505.0*  129.0*    Liver Enzymes  Recent Labs Lab 04/20/16 0415 05/08/2016 0500 04/22/16 0808  AST 112* 109* 78*  ALT 72* 67* 51  ALKPHOS 118 129* 128*  BILITOT 3.3* 3.7* 2.9*  ALBUMIN 1.3* 1.3* 1.0*    Cardiac Enzymes No results for input(s): TROPONINI, PROBNP in the last 168 hours.  Glucose  Recent Labs Lab 04/22/16 1018 04/22/16 1149 04/22/16 1635 04/22/16 1941 04/22/16 2319 04/23/16 0359  GLUCAP 159* 131* 120* 98 92 101*    Imaging No results found. STUDIES:  [facility] ([facility] negative  MRI brain  7/23: Bilateral cerebral  hemispheres and tight cerebellar hemisphere patchy acute and early subacute infarcts. No mass effect or hemorrhage. Central thromboembolic source is suspected.  EEG 7/23: Abnormal due to moderate diffuse slowing of the waking background. No seizure or epiletiform discharges.  TTE 7/24: EF 50-55%, lateral wall appears hypokinetic, systolic function normal, mild MR regurg Carotid US 7/24: right mild soft plaque CCA, mild to moderate plaque origin and proximal ICA and ECA 1-39% ICA stenosis.  EEG 8/2>>>enceph, no focus  MICROBIOLOGY: MRSA PCR 7/23:  Positive  Urine Ctx 7/23:  Negative  Blood Ctx x2 7/23:  Negative  Tracheal Asp Ctx 7/23:  MRSA Blood Ctx x2 7/28>>> NGTD Urine Ctx 7/28:  Negative  ANTIBIOTICS: Vancomycin 7/23 >>>10 days  Meropenem 7/23 >> 8/1 Diflucan 7/27 >>>8/2 Ceftriaxone 8/1 >>>  SIGNIFICANT EVENTS: Loop colostomy takedown 7/18 7/18-7/22: progressively lethargic  [facility]/23 [facility]p getting intubated for AMS and hypercarbia. [facility] at [facility] on [facility]  7/24 on CCB gtt for af. But fully awake and following commands. Extubated. 7/25 SLP evaluation for diet, ASA started, FEES 7/28 developed worsening abdominal pain, leukocytosis, CT showing complete SMA occlusion 7/29 Underwent aorto to SMA bypass with greater saphenous vein graft and small bowel resection 7/29 Developed hypotension to the 50s in post-op period, started on pressors 7/29 Code status changed to DNR 7/30 Ex Lap by CCS w/ necrotic bowel & ischemia s/p resection w/ intact bypass & oozing over peritoneal surfaces. 8/1 OR, limited small bowel resection, anastamosis x2, vascular graft intact 8/3 neo needed again 8/4 - belly closed, Tracheostomy placed, low UOP  LINES/TUBES: OETT 7/23>>>7/24; 7/29 >>8/4 R IJ CVL 7/19 >> 8/5 L Radial Art Line 7/29 >> 8/1 patient pulled R Radial Art Line 8/1 >>>plan to dc post op 8 /4 FOLEY 7/29 >> R NGT 7/29 >> IABP 7/29 >> out  (date?) Tracheostomy 8/4 >>  ASSESSMENT / PLAN:  PULMONARY A: Acute Hypercarbic Respiratory Failure Acute Hypoxic Respiratory Failure MRSA Pneumonia H/O COPD H/O OSA (intolerant of CPAP) H/O RUL Lobectomy - For NSCLC P:   Lasix 04/23/2016 x1 dose PSV as tolerated today VAP prevention Trach care  CARDIOVASCULAR A:  Shock -SIRS / sedation > resolving Atrial Fibrillation Elevated Troponin I - Likely demand ischemia H/O HTN  H/O Bilateral ICA Stenosis P:  D/c hydrocortisone Tele Monitor BP  GASTROINTESTINAL A:   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 H/O GERD  P:   TPN per surgery Pepcid IV q12hr Nutrition per surgery  RENAL A:   ARF, ATN  Anasarca P:   Lasix 04/23/2016 x1 dose Continue foley Monitor UOP  HEMATOLOGIC A:   Leukocytosis > steroid related? Anemia improving Thrombocytopenia improving  P:  Monitor for bleeding Transfuse for Hgb < 10  INFECTIOUS A:   WBC up 8/5, better 8/6  source? Steroids? MRSA Pneumonia At risk abdo contam P:   Keep ceftriaxone again 8/6 Stop steroids Culture if febrile  ENDOCRINE A:   H/O Hypothyroidism (TSH 28.2) P:   Levothyroxine 65mg IV daily Continuing Accu-Checks q4hr SSI   NEUROLOGIC A:   Encephalopathy> ICU related, medication related Post-operative Pain 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   P:   Goal RASS: 0 Fentanyl IV prn only   FAMILY UPDATE: Husband updated 8/6  Ccm time 34 min  BRoselie Awkward MD [place] [facility] Pager: [phone]: [phone] 3pm or if no response, call [phone] 04/23/2016 8:08 AM

## 2016-04-23 NOTE — Progress Notes (Signed)
NG tube retaped

## 2016-04-23 NOTE — Progress Notes (Signed)
PARENTERAL NUTRITION CONSULT NOTE - FOLLOW UP  Pharmacy Consult for TPN Indication: massive bowel resection  Patient Measurements: Height: '5\' 6"'$  (167.6 cm) Weight: 160 lb 7.9 oz (72.8 kg) IBW/kg (Calculated) : 59.3 Adjusted Body Weight: 63.9 kg  Vital Signs: Temp: 97.4 F (36.3 C) (08/06 0400) Temp Source: Axillary (08/06 0400) BP: 145/76 (08/06 0721) Pulse Rate: 98 (08/06 0721) Intake/Output from previous day: 08/05 0701 - 08/06 0700 In: 1950 [I.V.:1920; NG/GT:30] Out: 1330 [Urine:880; Emesis/NG output:200; Drains:250] Intake/Output from this shift: No intake/output data recorded.  Insulin Requirements in the past 24 hours:  3  units of SSI  Assessment: 75 yo F w/ bowel ischemia, SMA thrombus, s/p OR 7/29 w/ mesenteric artery bypass and SBR, OR 7/30 2 more short segments of small bowel removed- per CCS note overall most of small bowel viable, SMA graft pulse palpable. Plan back to OR likely Tues.  To start TPN for severe protein calorie malnutrition. Critically ill on fent, phenyephrine and vasopressin drips and getting FFP, PRBCs.  GI: Post op day #2 after second ex lap and abdominal closure. NG output 250 ml & drain output 242m yesterday which is down overall. Albumin low down to 1.0. Prealbumin down to 5.7. Last BM was on 7/27.  Endo: CBGs controlled (100-150s) on sensitive SSI. Hypoglycemic event on 7/31. Given one dose of hydrocortisone on 8/4. Pepcid in TPN  Lytes: wnl today exc Na low at 129. Phos ok at 4.0, Mg 1.8. CoCa 10.3. (CaxPhos ~40) Has PRN replacements ordered for Mg and K.  Renal: SCr up to 1.71, CrCl ~35-484mmin. UOP trending down overall, 0.5 ml/kg/hr yesterday. Net + ~18L this admission. 8/3 RN noted weeping edema of RUE. Discussed with MD and fluids stopped on 8/5. Trial of lasix '40mg'$  IV yesterday.  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 Intubated with Fi02 of 40%  Cards: BP ok and HR 80-90s in Afib. Neo gtt titrated off. 7/24 TTE- EF 50-55%,  mild MR regurg.    Hepatobil: AST and ALT elevated due to shock liver but almost down to wnl. TBili elevated but back down to 2.9 (peaked at 3.7) RN reported slight jaundice on eye exam on 8/4. Trig wnl.  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 Fentanyl gtt titrated off. GCS down to 8, RASS -1 (goal 0) 8/2 EEG- global encephalopathic process, non-specific etiology.   ID: S/P 10 days of vancomycin for MRSA PNA. Now on ceftriaxone d#6. Afebrile, WBC overall down but still elevated at 36.2  Best Practices: SCDs, famotidine TPN Access: PICC triple lumen TPN start date: 7/30 >>  Current Nutrition: NPO Clinimix E 5/15 at 8082mr (Provides 96g of protein and 1363 kcals per day - >90% of protein needs and >100% of kcal needs)  Nutritional Goals: per RD recs on 8/4 Kcal: 1287 Protein: 105-115 g F/U with RD recs  Plan:  Decrease Clinimix 5/15 (w/o lytes) to 25m71m Start IV fat emulsions today at 10ml8mDue to hypocaloric / high protein goal we will not be able to meet patient's needs with Clinimix. Would recommend starting MWF IV fat emulsion starting on 8/7.  This would provide a daily average of 90g of protein and 1484 kcal per day meeting 82% of protein and > 100% of kcal needs Add MVI and half TE (jaundice on exam with elevated Tbili) in TPN Continue famotidine '20mg'$  in TPN Continue sensitive SSI and adjust as needed Monitor TPN labs, daily Mg and Phos, Na closely F/U improvement after massive bowel resection  NathaOvid Curd  Oneita Hurt, PharmD, BCPS Clinical Pharmacist Pager [phone] 04/23/2016 7:38 AM

## 2016-04-24 ENCOUNTER — Encounter (HOSPITAL_COMMUNITY): Payer: Self-pay | Admitting: General Surgery

## 2016-04-24 LAB — GLUCOSE, CAPILLARY
GLUCOSE-CAPILLARY: 110 mg/dL — AB (ref 65–99)
GLUCOSE-CAPILLARY: 74 mg/dL (ref 65–99)
Glucose-Capillary: 101 mg/dL — ABNORMAL HIGH (ref 65–99)
Glucose-Capillary: 105 mg/dL — ABNORMAL HIGH (ref 65–99)
Glucose-Capillary: 111 mg/dL — ABNORMAL HIGH (ref 65–99)
Glucose-Capillary: 111 mg/dL — ABNORMAL HIGH (ref 65–99)
Glucose-Capillary: 112 mg/dL — ABNORMAL HIGH (ref 65–99)

## 2016-04-24 LAB — COMPREHENSIVE METABOLIC PANEL
ALT: 90 U/L — ABNORMAL HIGH (ref 14–54)
ANION GAP: 8 (ref 5–15)
AST: 165 U/L — ABNORMAL HIGH (ref 15–41)
Albumin: 1.2 g/dL — ABNORMAL LOW (ref 3.5–5.0)
Alkaline Phosphatase: 170 U/L — ABNORMAL HIGH (ref 38–126)
BUN: 99 mg/dL — ABNORMAL HIGH (ref 6–20)
CHLORIDE: 102 mmol/L (ref 101–111)
CO2: 20 mmol/L — ABNORMAL LOW (ref 22–32)
CREATININE: 1.94 mg/dL — AB (ref 0.44–1.00)
Calcium: 7.7 mg/dL — ABNORMAL LOW (ref 8.9–10.3)
GFR, EST AFRICAN AMERICAN: 28 mL/min — AB (ref >60)
GFR, EST NON AFRICAN AMERICAN: 24 mL/min — AB (ref >60)
Glucose, Bld: 108 mg/dL — ABNORMAL HIGH (ref 65–99)
POTASSIUM: 3.3 mmol/L — AB (ref 3.5–5.1)
SODIUM: 130 mmol/L — AB (ref 135–145)
Total Bilirubin: 5.1 mg/dL — ABNORMAL HIGH (ref 0.3–1.2)
Total Protein: 3.8 g/dL — ABNORMAL LOW (ref 6.5–8.1)

## 2016-04-24 LAB — CBC WITH DIFFERENTIAL/PLATELET
BASOS ABS: 0 10*3/uL (ref 0.0–0.1)
Basophils Relative: 0 %
EOS PCT: 1 %
Eosinophils Absolute: 0.2 10*3/uL (ref 0.0–0.7)
HCT: 24.6 % — ABNORMAL LOW (ref 36.0–46.0)
Hemoglobin: 8.1 g/dL — ABNORMAL LOW (ref 12.0–15.0)
LYMPHS ABS: 0.5 10*3/uL — AB (ref 0.7–4.0)
Lymphocytes Relative: 3 %
MCH: 30.3 pg (ref 26.0–34.0)
MCHC: 32.9 g/dL (ref 30.0–36.0)
MCV: 92.1 fL (ref 78.0–100.0)
MONO ABS: 0.5 10*3/uL (ref 0.1–1.0)
Monocytes Relative: 3 %
Neutro Abs: 14 10*3/uL — ABNORMAL HIGH (ref 1.7–7.7)
Neutrophils Relative %: 93 %
PLATELETS: 124 10*3/uL — AB (ref 150–400)
RBC: 2.67 MIL/uL — AB (ref 3.87–5.11)
RDW: 16.8 % — AB (ref 11.5–15.5)
WBC: 15.2 10*3/uL — AB (ref 4.0–10.5)

## 2016-04-24 LAB — MAGNESIUM: Magnesium: 1.7 mg/dL (ref 1.7–2.4)

## 2016-04-24 LAB — FIBRINOGEN: FIBRINOGEN: 631 mg/dL — AB (ref 210–475)

## 2016-04-24 LAB — TRIGLYCERIDES: TRIGLYCERIDES: 144 mg/dL (ref <150)

## 2016-04-24 LAB — PREALBUMIN: PREALBUMIN: 7.4 mg/dL — AB (ref 18–38)

## 2016-04-24 LAB — APTT: aPTT: 33 seconds (ref 24–36)

## 2016-04-24 LAB — PROTIME-INR
INR: 1.12
Prothrombin Time: 14.4 seconds (ref 11.4–15.2)

## 2016-04-24 LAB — PHOSPHORUS: PHOSPHORUS: 3.8 mg/dL (ref 2.5–4.6)

## 2016-04-24 MED ORDER — TRACE MINERALS CR-CU-MN-SE-ZN 10-1000-500-60 MCG/ML IV SOLN
INTRAVENOUS | Status: AC
  Administered 2016-04-24: 19:00:00 via INTRAVENOUS
  Filled 2016-04-24: qty 1800

## 2016-04-24 MED ORDER — FAT EMULSION 20 % IV EMUL
240.0000 mL | INTRAVENOUS | Status: AC
  Administered 2016-04-24: 240 mL via INTRAVENOUS
  Filled 2016-04-24: qty 250

## 2016-04-24 MED ORDER — MAGNESIUM SULFATE IN D5W 1-5 GM/100ML-% IV SOLN
1.0000 g | Freq: Once | INTRAVENOUS | Status: AC
  Administered 2016-04-24: 1 g via INTRAVENOUS
  Filled 2016-04-24: qty 100

## 2016-04-24 MED ORDER — POTASSIUM CHLORIDE 10 MEQ/50ML IV SOLN: 10.0000 meq | INTRAVENOUS | Status: DC

## 2016-04-24 MED ORDER — POTASSIUM CHLORIDE 10 MEQ/50ML IV SOLN
10.0000 meq | INTRAVENOUS | Status: AC
  Administered 2016-04-24 (×3): 10 meq via INTRAVENOUS
  Filled 2016-04-24 (×2): qty 50

## 2016-04-24 MED ORDER — ALTEPLASE 2 MG IJ SOLR
2.0000 mg | Freq: Once | INTRAMUSCULAR | Status: AC
  Administered 2016-04-24: 2 mg
  Filled 2016-04-24: qty 2

## 2016-04-24 NOTE — Consult Note (Addendum)
[facility] Nurse wound consult note Reason for Consult: Consult requested for right thigh wound Vac application.  Pt has a full thickness wound which has a large amt yellow drainage and is followed by the Vascular team. Applied Vac to contain drainage to site; 2X1X4 cm with tunneling at 1:00 o'clock to 4 cm.  Wound bed is red and moist with large amt yellow drainage, no odor.  Applied one piece black foam to the wound, then drape to protect over the staples which are located next to the wound, then another piece of black foam to 164m over the incisional area which is weeping mod amt yellow drainage.    Pt also has a Vac dressing to full thickness post-op abd wounds; this is due to be changed and pt is followed by the surgical team. Midline abd 30X3X4cm, beefy red, mod amt pink drainage in the cannister.  Applied 2 pieces black foam and bridged to another full thickness wound on the left abd; 2 pieces black foam applied , 2X1X2cm with staples surrounding the site. Beefy red with small amt yellow drainage, no odor. Both Vac dressings Yed together to one machine.  WOC will plan to change dressings Q M/W/F. Pt was medicated prior to procedure and tolerated with mod amt pain.  DJulien GirtMSN, RN, [place] [facility]

## 2016-04-24 NOTE — Progress Notes (Signed)
PULMONARY / CRITICAL CARE MEDICINE   Name: Amy [name] MRN: [id number] DOB: 18-Jan-1941    ADMISSION DATE:  03/24/2016 CONSULTATION DATE:  7/23  REFERRING MD:  [facility] hospital per family request   CHIEF COMPLAINT:  Acute encephalopathy and progressive respiratory failure   HISTORY OF PRESENT ILLNESS:   This is a 75 year old white female who was initially admitted to [facility] hospital where she underwent an elective loop colostomy takedown on 7/18. Her immediate post-op course was c/b lethargy and actually required narcan in PACU. She never really improved w/ hospital notes continuing to raise concern for altered mental status and difficulty to arouse. She ultimately required emergent intubation for hypercarbia (CO2 52) and inability to protect airway. She was transferred per family request on 7/23 for further evaluation and care. MRI confirmed new CVA. Suspect this was multifactorial: CVA, meds, +/- pneumonia. She was extubated on 7/24 and transferred to the SDU. On 7/28, she developed worsening abdominal pain and leukocytosis. 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 On 7/29, she underwent aorto to superior mesenteric artery bypass with greater saphenous vein graft and small bowel resection. In the immediate post-op period, she became hypotensive to the 50s and was started on pressors. She was transferred back to [facility] service. After multiple surgeries her abdomen was closed on 8/4 as her gut appeared healthy at that point, tracheostomy placed on 8/4.  SUBJECTIVE:   Intermittently tolerates PSV but then periods of apnea  Note increase S Cr; negative 1300 last 24h, remains 17.8L net positive    VITAL SIGNS: BP (!) 114/95 (BP Location: Right Arm)   Pulse 88   Temp 97.6 F (36.4 C) (Axillary)   Resp 15   Ht '5\' 6"'$  (1.676 m)   Wt 70.7 kg (155 lb 13.8 oz)   SpO2 100%   BMI 25.16 kg/m   HEMODYNAMICS:    VENTILATOR SETTINGS: Vent Mode:  PRVC FiO2 (%):  [40 %] 40 % Set Rate:  [14 bmp] 14 bmp Vt Set:  [480 mL] 480 mL PEEP:  [5 cmH20] 5 cmH20 Plateau Pressure:  [12 cmH20-21 cmH20] 15 cmH20  INTAKE / OUTPUT: I/O last 3 completed shifts: In: 2895 [I.V.:2865; NG/GT:30] Out: 3299 [Urine:3555; Emesis/NG output:250; Drains:400]  PHYSICAL EXAMINATION: General: on vent, no distress HENT: tracheostomy clean, dry PULM: CTA B, vent supported breaths CV: RRR, no mgr GI: midline scar with wound vac Derm: massive anasarca, some scattered bruising, scleral icterus? Neuro: nods to questions, deep breath on command but then quickly back to sleep LABS:  BMET  Recent Labs Lab 04/22/2016 0500 04/23/16 0415 04/24/16 0406  NA 137 129* 130*  K 4.8 4.7 3.3*  CL 109 103 102  CO2 24 20* 20*  BUN 60* 89* 99*  CREATININE 1.21* 1.71* 1.94*  GLUCOSE 110* 110* 108*    Electrolytes  Recent Labs Lab 04/20/2016 0500 04/22/16 0330 04/23/16 0415 04/24/16 0406  CALCIUM 7.7*  --  7.8* 7.7*  MG 1.9 2.0 1.8 1.7  PHOS 4.7* 4.2 4.0 3.8    CBC  Recent Labs Lab 04/22/16 0330 04/23/16 0415 04/24/16 0406  WBC 39.1* 36.2* 15.2*  HGB 10.1* 8.7* 8.1*  HCT 31.4* 27.1* 24.6*  PLT 104* 119* 124*    Coag's  Recent Labs Lab 04/22/16 0330 04/23/16 0415 04/24/16 0406  APTT 37* 35 33  INR 1.18 1.21 1.12    Sepsis Markers  Recent Labs Lab 04/17/16 1653  LATICACIDVEN 1.3    ABG  Recent Labs Lab 04/19/16 0432 05/14/2016 1436 05/18/2016 1535  PHART 7.341* 7.383 7.302*  PCO2ART 48.1* 42.4 45.6*  PO2ART 135.0* 505.0* 129.0*    Liver Enzymes  Recent Labs Lab 05/10/2016 0500 04/22/16 0808 04/24/16 0406  AST 109* 78* 165*  ALT 67* 51 90*  ALKPHOS 129* 128* 170*  BILITOT 3.7* 2.9* 5.1*  ALBUMIN 1.3* 1.0* 1.2*    Cardiac Enzymes No results for input(s): TROPONINI, PROBNP in the last 168 hours.  Glucose  Recent Labs Lab 04/23/16 1221 04/23/16 1537 04/23/16 1936 04/24/16 0009 04/24/16 0350 04/24/16 0743   GLUCAP 122* 114* 122* 110* 111* 101*    Imaging No results found. STUDIES:  CT head (at [facility]): negative  MRI brain  7/23: Bilateral cerebral hemispheres and tight cerebellar hemisphere patchy acute and early subacute infarcts. No mass effect or hemorrhage. Central thromboembolic source is suspected.  EEG 7/23: Abnormal due to moderate diffuse slowing of the waking background. No seizure or epiletiform discharges.  TTE 7/24: EF 50-55%, lateral wall appears hypokinetic, systolic function normal, mild MR regurg Carotid US 7/24: right mild soft plaque CCA, mild to moderate plaque origin and proximal ICA and ECA 1-39% ICA stenosis.  EEG 8/2>>>enceph, no focus  MICROBIOLOGY: MRSA PCR 7/23:  Positive  Urine Ctx 7/23:  Negative  Blood Ctx x2 7/23:  Negative  Tracheal Asp Ctx 7/23:  MRSA Blood Ctx x2 7/28>>> NGTD Urine Ctx 7/28:  Negative  ANTIBIOTICS: Vancomycin 7/23 >>>10 days  Meropenem 7/23 >> 8/1 Diflucan 7/27 >>>8/2 Ceftriaxone 8/1 >>>  SIGNIFICANT EVENTS: Loop colostomy takedown 7/18 7/18-7/22: progressively lethargic  7/23 transferred to [facility] s/p getting intubated for AMS and hypercarbia. Surgical service consulted at [facility] on arrival  7/24 on CCB gtt for af. But fully awake and following commands. Extubated. 7/25 SLP evaluation for diet, ASA started, FEES 7/28 developed worsening abdominal pain, leukocytosis, CT showing complete SMA occlusion 7/29 Underwent aorto to SMA bypass with greater saphenous vein graft and small bowel resection 7/29 Developed hypotension to the 50s in post-op period, started on pressors 7/29 Code status changed to DNR 7/30 Ex Lap by CCS w/ necrotic bowel & ischemia s/p resection w/ intact bypass & oozing over peritoneal surfaces. 8/1 OR, limited small bowel resection, anastamosis x2, vascular graft intact 8/3 neo needed again 8/4 - belly closed, Tracheostomy placed, low UOP  LINES/TUBES: OETT 7/23>>>7/24; 7/29 >>8/4 R IJ CVL 7/19 >> 8/5 L  Radial Art Line 7/29 >> 8/1 patient pulled R Radial Art Line 8/1 >>>plan to dc post op 8 /4 FOLEY 7/29 >> R NGT 7/29 >> IABP 7/29 >> out (date?) Tracheostomy 8/4 >>  ASSESSMENT / PLAN:  PULMONARY A: Acute Hypercarbic Respiratory Failure Acute Hypoxic Respiratory Failure MRSA Pneumonia H/O COPD H/O OSA (intolerant of CPAP) H/O RUL Lobectomy - For NSCLC P:   Hold off on further lasix 8/7 given increase S Cr PSV as tolerated today VAP prevention Trach care  CARDIOVASCULAR A:  Shock -SIRS / sedation > resolving Atrial Fibrillation Elevated Troponin I - Likely demand ischemia H/O HTN  H/O Bilateral ICA Stenosis P:  Off hydrocortisone Tele Monitor BP  GASTROINTESTINAL A:   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 H/O GERD  P:   TPN per surgery Pepcid IV q12hr Nutrition per surgery Wound vac in place, pain control  RENAL A:   ARF, ATN  Anasarca Hypokalemia  P:   Lasix given x 1 on  8/6, hold 8/7 Continue foley Monitor UOP Replace K+  HEMATOLOGIC A:   Leukocytosis > steroid related? Anemia improving Thrombocytopenia improving  P:  Monitor for bleeding Transfuse for Hgb < 10  INFECTIOUS A:   WBC up 8/5, better 8/6 source? Steroids? MRSA Pneumonia At risk abdo contam P:   Keep ceftriaxone again 8/7, consider d/c 8/8 (7 days empiric rx) Off steroids Culture if febrile  ENDOCRINE A:   H/O Hypothyroidism (TSH 28.2) P:   Levothyroxine 87mg IV daily Continuing Accu-Checks q4hr SSI   NEUROLOGIC A:   Encephalopathy> ICU related, medication related Post-operative Pain 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   P:   Goal RASS: 0 Fentanyl IV prn only   FAMILY UPDATE: Husband updated 8/6  Ccm time 319min   RBaltazar Apo MD, PhD 04/24/2016, 11:35 AM [facility] [phone] if no answer  [phone]

## 2016-04-24 NOTE — Progress Notes (Addendum)
[facility] Progress Note  Subjective    Trached, following commands.   Objective Vitals:   04/24/16 1100 04/24/16 1200  BP: (!) 148/107 138/88  Pulse: 96 (!) 54  Resp: (!) 23 (!) 21  Temp:      Intake/Output Summary (Last 24 hours) at 04/24/16 1203 Last data filed at 04/24/16 1000  Gross per 24 hour  Intake             1790 ml  Output             3000 ml  Net            -1210 ml   On trach collar, following commands.  Abdomen with VAC intact Right thigh dressing removed. Two ABD pads are saturated with serosanguinous drainage.  Open wound proximal thigh is clean with serous drainage present. Remaining wounds staples intact with serous drainage.  Feet are warm and edematous bilaterally  Assessment/Planning: 75 y.o. female is s/p: aorto to superior mesenteric artery bypass with reversed right greater saphenous vein.   3 Days Post-Op   Right groin saphenectomy site with copious serous drainage. Continue to pack open wound and keep dressing dry, changing dressings as needed to wick moisture and prevent wound infection.  WBC improving.  Abdomen closed 05/05/2016.   Alvia Grove 04/24/2016 12:03 PM -- Addendum Spoke with Dr. Oneida Alar regarding right thigh drainage. Will order VAC to right thigh.   Virgina Jock, PA-C  Serous drainage right leg most likely secondary to anasarca or lymph leak.  Will place VAC.  Hopefully this will improve as overall nutrition status improves Jaundice ? Cholestasis from TPN or biliary/gallbladder (defer to General surgery)  Ruta Hinds, MD [facility] [place] Office: [phone] Pager: [phone]    Laboratory CBC    Component Value Date/Time   WBC 15.2 (H) 04/24/2016 0406   HGB 8.1 (L) 04/24/2016 0406   HCT 24.6 (L) 04/24/2016 0406   PLT 124 (L) 04/24/2016 0406    BMET    Component Value Date/Time   NA 130 (L) 04/24/2016 0406   K 3.3 (L) 04/24/2016 0406   CL 102 04/24/2016  0406   CO2 20 (L) 04/24/2016 0406   GLUCOSE 108 (H) 04/24/2016 0406   BUN 99 (H) 04/24/2016 0406   CREATININE 1.94 (H) 04/24/2016 0406   CALCIUM 7.7 (L) 04/24/2016 0406   GFRNONAA 24 (L) 04/24/2016 0406   GFRAA 28 (L) 04/24/2016 0406    COAG Lab Results  Component Value Date   INR 1.12 04/24/2016   INR 1.21 04/23/2016   INR 1.18 04/22/2016   No results found for: PTT  Antibiotics Anti-infectives    Start     Dose/Rate Route Frequency Ordered Stop   04/29/2016 1015  cefTRIAXone (ROCEPHIN) 2 g in dextrose 5 % 50 mL IVPB     2 g 100 mL/hr over 30 Minutes Intravenous  Once 05/05/2016 1010 04/27/2016 1045   05/06/2016 1000  cefTRIAXone (ROCEPHIN) 2 g in dextrose 5 % 50 mL IVPB     2 g 100 mL/hr over 30 Minutes Intravenous Every 24 hours 05/13/2016 0846     04/17/16 1900  vancomycin (VANCOCIN) 500 mg in sodium chloride 0.9 % 100 mL IVPB     500 mg 100 mL/hr over 60 Minutes Intravenous Every 24 hours 04/17/16 1748 05/15/2016 1924   04/13/16 2130  fluconazole (DIFLUCAN) IVPB 200 mg  Status:  Discontinued     200 mg 100 mL/hr over 60  Minutes Intravenous Every 24 hours 04/13/16 2033 04/19/16 0939   04/13/16 1700  vancomycin (VANCOCIN) IVPB 750 mg/150 ml premix  Status:  Discontinued     750 mg 150 mL/hr over 60 Minutes Intravenous Every 24 hours 04/12/16 1814 04/17/16 1748   04/12/16 2000  fluconazole (DIFLUCAN) tablet 200 mg  Status:  Discontinued     200 mg Oral Daily 04/12/16 1758 04/13/16 2028   04/12/16 1400  meropenem (MERREM) 1 g in sodium chloride 0.9 % 100 mL IVPB  Status:  Discontinued     1 g 200 mL/hr over 30 Minutes Intravenous Every 8 hours 04/12/16 1150 05/11/2016 0846   04/10/16 0500  meropenem (MERREM) 1 g in sodium chloride 0.9 % 100 mL IVPB  Status:  Discontinued     1 g 200 mL/hr over 30 Minutes Intravenous Every 12 hours 03/24/2016 1654 04/12/16 1150   04/10/16 0500  vancomycin (VANCOCIN) 500 mg in sodium chloride 0.9 % 100 mL IVPB  Status:  Discontinued     500 mg 100 mL/hr  over 60 Minutes Intravenous Every 12 hours 03/18/2016 1654 04/12/16 1814   04/06/2016 1700  meropenem (MERREM) 1 g in sodium chloride 0.9 % 100 mL IVPB     1 g 200 mL/hr over 30 Minutes Intravenous  Once 03/30/2016 1638 04/08/2016 1730   04/14/2016 1700  vancomycin (VANCOCIN) IVPB 1000 mg/200 mL premix  Status:  Discontinued     1,000 mg 200 mL/hr over 60 Minutes Intravenous  Once 03/26/2016 1638 04/07/2016 1641   04/03/2016 1700  vancomycin (VANCOCIN) 1,250 mg in sodium chloride 0.9 % 250 mL IVPB     1,250 mg 166.7 mL/hr over 90 Minutes Intravenous  Once 03/27/2016 1641 03/30/2016 [place], PA-C [facility] Office: [phone] Pager: [phone] 04/24/2016 12:03 PM

## 2016-04-24 NOTE — Progress Notes (Signed)
3 Days Post-Op  Subjective: No acute events.   Objective: Vital signs in last 24 hours: Temp:  [96.9 F (36.1 C)-99.3 F (37.4 C)] 97.6 F (36.4 C) (08/07 0750) Pulse Rate:  [60-98] 88 (08/07 1000) Resp:  [8-28] 15 (08/07 1000) BP: (90-176)/(41-109) 114/95 (08/07 1000) SpO2:  [100 %] 100 % (08/07 1000) FiO2 (%):  [40 %] 40 % (08/07 1000) Weight:  [70.7 kg (155 lb 13.8 oz)] 70.7 kg (155 lb 13.8 oz) (08/07 0454) Last BM Date:  (Prior to surgery)  Intake/Output from previous day: 08/06 0701 - 08/07 0700 In: 1935 [I.V.:1905; NG/GT:30] Out: 0093 [Urine:3065; Emesis/NG output:50; Drains:150] Intake/Output this shift: Total I/O In: 255 [I.V.:255] Out: 475 [Urine:425; Emesis/NG output:20; Drains:30]  Asleep, easily arousable. Follows some commands Trach site ok abd - soft nd wound vac intact. Serous drainage in cannister. Not much out of NG  Lab Results:   Recent Labs  04/23/16 0415 04/24/16 0406  WBC 36.2* 15.2*  HGB 8.7* 8.1*  HCT 27.1* 24.6*  PLT 119* 124*   BMET  Recent Labs  04/23/16 0415 04/24/16 0406  NA 129* 130*  K 4.7 3.3*  CL 103 102  CO2 20* 20*  GLUCOSE 110* 108*  BUN 89* 99*  CREATININE 1.71* 1.94*  CALCIUM 7.8* 7.7*   PT/INR  Recent Labs  04/23/16 0415 04/24/16 0406  LABPROT 15.4* 14.4  INR 1.21 1.12   ABG  Recent Labs  05/08/2016 1436 04/29/2016 1535  PHART 7.383 7.302*  HCO3 25.3* 22.5    Studies/Results: No results found.  Anti-infectives: Anti-infectives    Start     Dose/Rate Route Frequency Ordered Stop   05/02/2016 1015  cefTRIAXone (ROCEPHIN) 2 g in dextrose 5 % 50 mL IVPB     2 g 100 mL/hr over 30 Minutes Intravenous  Once 04/22/2016 1010 05/09/2016 1045   05/16/2016 1000  cefTRIAXone (ROCEPHIN) 2 g in dextrose 5 % 50 mL IVPB     2 g 100 mL/hr over 30 Minutes Intravenous Every 24 hours 04/24/2016 0846     04/17/16 1900  vancomycin (VANCOCIN) 500 mg in sodium chloride 0.9 % 100 mL IVPB     500 mg 100 mL/hr over 60 Minutes  Intravenous Every 24 hours 04/17/16 1748 05/16/2016 1924   04/13/16 2130  fluconazole (DIFLUCAN) IVPB 200 mg  Status:  Discontinued     200 mg 100 mL/hr over 60 Minutes Intravenous Every 24 hours 04/13/16 2033 04/19/16 0939   04/13/16 1700  vancomycin (VANCOCIN) IVPB 750 mg/150 ml premix  Status:  Discontinued     750 mg 150 mL/hr over 60 Minutes Intravenous Every 24 hours 04/12/16 1814 04/17/16 1748   04/12/16 2000  fluconazole (DIFLUCAN) tablet 200 mg  Status:  Discontinued     200 mg Oral Daily 04/12/16 1758 04/13/16 2028   04/12/16 1400  meropenem (MERREM) 1 g in sodium chloride 0.9 % 100 mL IVPB  Status:  Discontinued     1 g 200 mL/hr over 30 Minutes Intravenous Every 8 hours 04/12/16 1150 05/17/2016 0846   04/10/16 0500  meropenem (MERREM) 1 g in sodium chloride 0.9 % 100 mL IVPB  Status:  Discontinued     1 g 200 mL/hr over 30 Minutes Intravenous Every 12 hours 03/30/2016 1654 04/12/16 1150   04/10/16 0500  vancomycin (VANCOCIN) 500 mg in sodium chloride 0.9 % 100 mL IVPB  Status:  Discontinued     500 mg 100 mL/hr over 60 Minutes Intravenous Every 12 hours 03/26/2016 1654  04/12/16 1814   04/08/2016 1700  meropenem (MERREM) 1 g in sodium chloride 0.9 % 100 mL IVPB     1 g 200 mL/hr over 30 Minutes Intravenous  Once 04/02/2016 1638 04/02/2016 1730   03/26/2016 1700  vancomycin (VANCOCIN) IVPB 1000 mg/200 mL premix  Status:  Discontinued     1,000 mg 200 mL/hr over 60 Minutes Intravenous  Once 03/21/2016 1638 04/10/2016 1641   03/28/2016 1700  vancomycin (VANCOCIN) 1,250 mg in sodium chloride 0.9 % 250 mL IVPB     1,250 mg 166.7 mL/hr over 90 Minutes Intravenous  Once 03/27/2016 1641 03/31/2016 1900      Assessment/Plan: S/p colostomy takedown at [facility] S/p multiple explorations, SB resections, SMA bypass S/p SB anastomosis x2 on 8/1 EXPLORATORY LAPAROTOMY,  CLOSURE OF ABDOMEN 8/4  Given severity of bowel condition, would take her very slow with respect to GI nutrition/mgmt Cont NG to LIWS Cont  wound vac Cont TPN Wbc trending down May consider trickle tube feeds in next day or so  Leighton Ruff. Redmond Pulling, MD, FACS General, Bariatric, & Minimally Invasive Surgery [facility], [place]   LOS: 15 days    Gayland Curry 04/24/2016

## 2016-04-24 NOTE — Progress Notes (Signed)
PARENTERAL NUTRITION CONSULT NOTE - FOLLOW UP  Pharmacy Consult for TPN Indication: massive bowel resection  Patient Measurements: Height: '5\' 6"'$  (167.6 cm) Weight: 155 lb 13.8 oz (70.7 kg) IBW/kg (Calculated) : 59.3 Adjusted Body Weight: 63.9 kg  Vital Signs: Temp: 99.3 F (37.4 C) (08/07 0000) Temp Source: Oral (08/07 0000) BP: 120/105 (08/07 0600) Pulse Rate: 88 (08/07 0600) Intake/Output from previous day: 08/06 0701 - 08/07 0700 In: 1935 [I.V.:1905; NG/GT:30] Out: 3818 [Urine:3065; Emesis/NG output:50; Drains:150] Intake/Output from this shift: No intake/output data recorded.  Insulin Requirements in the past 24 hours:  2 units of sensitive SSI  Assessment: 75 yo F w/ bowel ischemia, SMA thrombus, s/p OR 7/29 w/ mesenteric artery bypass and SBR, OR 7/30 2 more short segments of small bowel removed- per [facility] note overall most of small bowel viable, SMA graft pulse palpable. Now s/p abdominal closure on TPN for severe protein calorie malnutrition.   GI: Post op day #3 after second ex lap and abdominal closure. NG output 50 ml & drain output 166m yesterday which is down overall. Albumin up 1 >>1.2. Prealbumin up 5.7>> 7.4. Last BM was on 7/27.  Endo: CBGs controlled (101-122) on sensitive SSI. Hypoglycemic event on 7/31. Off steroids. Pepcid in TPN  Lytes: Na low but up at 130. Phos ok at 3.8. K dropped at 3.3 and Mg trending down at 1.7 with no electrolytes in TPN. CoCa 9.9. Has PRN replacements ordered for Mg and K.  Renal: SCr up to 1.94 in setting of Lasix, CrCl ~20-237mmin. UOP improved at 1.8 yesterday on Lasix IV trial 8/5 and 8/6. Net + ~17.5L this admission. BUN high 99-could be indicator that patient is not clearing protein well and will monitor closely. 8/3 RN noted weeping edema of RUE. Discussed with MD and fluids stopped on 8/5.   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 Trach placed 8/4. Intubated with Fi02 of 40%  Cards: BP ok and HR 80s to 90s (1  episode of 141 this AM) in Afib. Neo off 8/5. 7/24 TTE- EF 50-55%, mild MR regurg.  Wt stable.   Hepatobil: AST and ALT bumped again today (165, 90). TBili bumped again 5.1. RN reported slight jaundice on eye exam on 8/4. Trig wnl.  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 Prn Fentanyl. GCS down to 8, RASS -1 (goal 0) 8/2 EEG- global encephalopathic process, non-specific etiology.   ID: S/P 10 days of vancomycin for MRSA PNA. Now on ceftriaxone d#7. Afebrile, WBC trending down at 15.2.   Best Practices: SCDs, famotidine TPN Access: PICC triple lumen TPN start date: 7/30 >>  Current Nutrition: NPO Clinimix 5/15 at 7528mr + 20% fat emulsion @ 10 ml/hr(Provides 90g of protein and 1484 kcals per day -82% of protein needs and >100% of kcal needs)  Nutritional Goals: per RD recs on 8/4 Kcal: 1287 Protein: 105-115 g F/U with RD recs  Plan:  Continue Clinimix 5/15 (w/o lytes) at 10m56m Continue 20% IV fat emulsions on MWF at 10ml62m- will receive today. Due to hypocaloric / high protein goal we will not be able to meet patient's needs with Clinimix. This would provide a daily average of 90g of protein and 1484 kcal per day meeting 82% of protein and > 100% of kcal needs.  Add MVI and half TE (jaundice on exam with elevated Tbili) in TPN Continue famotidine '20mg'$  in TPN Continue sensitive SSI and adjust as needed Monitor TPN labs, daily Mg and Phos, Na closely F/U improvement  after massive bowel resection  Give Magnesium 1g IV today. Give KCl 69mq IV x2 today.   JSloan Leiter PharmD, BCPS Clinical Pharmacist [phone]/03/2016 7:06 AM

## 2016-04-25 ENCOUNTER — Encounter (HOSPITAL_COMMUNITY): Payer: Self-pay

## 2016-04-25 LAB — CBC WITH DIFFERENTIAL/PLATELET
BASOS ABS: 0 10*3/uL (ref 0.0–0.1)
BASOS PCT: 0 %
EOS PCT: 1 %
Eosinophils Absolute: 0.3 10*3/uL (ref 0.0–0.7)
HEMATOCRIT: 24.4 % — AB (ref 36.0–46.0)
HEMOGLOBIN: 8.1 g/dL — AB (ref 12.0–15.0)
LYMPHS PCT: 2 %
Lymphs Abs: 0.6 10*3/uL — ABNORMAL LOW (ref 0.7–4.0)
MCH: 30 pg (ref 26.0–34.0)
MCHC: 33.2 g/dL (ref 30.0–36.0)
MCV: 90.4 fL (ref 78.0–100.0)
MONOS PCT: 4 %
Monocytes Absolute: 1.3 10*3/uL — ABNORMAL HIGH (ref 0.1–1.0)
NEUTROS ABS: 29.6 10*3/uL — AB (ref 1.7–7.7)
NEUTROS PCT: 93 %
Platelets: 131 10*3/uL — ABNORMAL LOW (ref 150–400)
RBC: 2.7 MIL/uL — ABNORMAL LOW (ref 3.87–5.11)
RDW: 16.8 % — ABNORMAL HIGH (ref 11.5–15.5)
WBC: 31.8 10*3/uL — ABNORMAL HIGH (ref 4.0–10.5)

## 2016-04-25 LAB — PHOSPHORUS: Phosphorus: 3.8 mg/dL (ref 2.5–4.6)

## 2016-04-25 LAB — APTT: aPTT: 32 seconds (ref 24–36)

## 2016-04-25 LAB — PROTIME-INR
INR: 1.26
Prothrombin Time: 15.9 seconds — ABNORMAL HIGH (ref 11.4–15.2)

## 2016-04-25 LAB — GLUCOSE, CAPILLARY
GLUCOSE-CAPILLARY: 113 mg/dL — AB (ref 65–99)
GLUCOSE-CAPILLARY: 106 mg/dL — AB (ref 65–99)
Glucose-Capillary: 125 mg/dL — ABNORMAL HIGH (ref 65–99)
Glucose-Capillary: 110 mg/dL — ABNORMAL HIGH (ref 65–99)
Glucose-Capillary: 106 mg/dL — ABNORMAL HIGH (ref 65–99)

## 2016-04-25 LAB — BASIC METABOLIC PANEL
Anion gap: 8 (ref 5–15)
BUN: 110 mg/dL — AB (ref 6–20)
CHLORIDE: 102 mmol/L (ref 101–111)
CO2: 19 mmol/L — ABNORMAL LOW (ref 22–32)
Calcium: 7.8 mg/dL — ABNORMAL LOW (ref 8.9–10.3)
Creatinine, Ser: 1.99 mg/dL — ABNORMAL HIGH (ref 0.44–1.00)
GFR calc Af Amer: 27 mL/min — ABNORMAL LOW (ref >60)
GFR calc non Af Amer: 23 mL/min — ABNORMAL LOW (ref >60)
Glucose, Bld: 117 mg/dL — ABNORMAL HIGH (ref 65–99)
POTASSIUM: 3.3 mmol/L — AB (ref 3.5–5.1)
SODIUM: 129 mmol/L — AB (ref 135–145)

## 2016-04-25 LAB — MAGNESIUM: MAGNESIUM: 1.9 mg/dL (ref 1.7–2.4)

## 2016-04-25 LAB — FIBRINOGEN: Fibrinogen: 655 mg/dL — ABNORMAL HIGH (ref 210–475)

## 2016-04-25 MED ORDER — SODIUM CHLORIDE 0.9 % IV SOLN
INTRAVENOUS | Status: DC
  Administered 2016-04-28: 14:00:00 via INTRAVENOUS

## 2016-04-25 MED ORDER — CHLORHEXIDINE GLUCONATE 0.12% ORAL RINSE (MEDLINE KIT)
15.0000 mL | Freq: Two times a day (BID) | OROMUCOSAL | Status: DC
  Administered 2016-04-26 – 2016-04-29 (×7): 15 mL via OROMUCOSAL

## 2016-04-25 MED ORDER — TRACE MINERALS CR-CU-MN-SE-ZN 10-1000-500-60 MCG/ML IV SOLN
INTRAVENOUS | Status: AC
  Administered 2016-04-25: 17:00:00 via INTRAVENOUS
  Filled 2016-04-25: qty 1800

## 2016-04-25 MED ORDER — ANTISEPTIC ORAL RINSE SOLUTION (CORINZ)
7.0000 mL | OROMUCOSAL | Status: DC
  Administered 2016-04-25 (×5): 7 mL via OROMUCOSAL

## 2016-04-25 MED ORDER — POTASSIUM CHLORIDE 10 MEQ/50ML IV SOLN
10.0000 meq | INTRAVENOUS | Status: AC
Start: 2016-04-25 — End: 2016-04-25
  Administered 2016-04-25 (×3): 10 meq via INTRAVENOUS
  Filled 2016-04-25 (×3): qty 50

## 2016-04-25 MED ORDER — ANTISEPTIC ORAL RINSE SOLUTION (CORINZ)
7.0000 mL | Freq: Four times a day (QID) | OROMUCOSAL | Status: DC
  Administered 2016-04-26 – 2016-04-29 (×13): 7 mL via OROMUCOSAL

## 2016-04-25 NOTE — Progress Notes (Signed)
4 Days Post-Op  Subjective: No acute events. Pt on vent  Objective: Vital signs in last 24 hours: Temp:  [96.9 F (36.1 C)-98.1 F (36.7 C)] 97.6 F (36.4 C) (08/08 0345) Pulse Rate:  [25-105] 25 (08/08 1000) Resp:  [0-31] 19 (08/08 1000) BP: (100-165)/(43-128) 107/62 (08/08 1000) SpO2:  [97 %-100 %] 100 % (08/08 1000) FiO2 (%):  [40 %] 40 % (08/08 0920) Weight:  [70.3 kg (154 lb 15.7 oz)] 70.3 kg (154 lb 15.7 oz) (08/08 0240) Last BM Date:  (UTA)  Intake/Output from previous day: 08/07 0701 - 08/08 0700 In: 2200 [I.V.:1870; NG/GT:30; IV Piggyback:300] Out: 2390 [Urine:1890; Emesis/NG output:120; Drains:380] Intake/Output this shift: Total I/O In: 325 [I.V.:325] Out: 380 [Urine:330; Drains:50]  More alert today Soft, some TTP, wound vac inplace. Nd. Drains - serous Starting to have some irritation b/t thighs from foley  Lab Results:   Recent Labs  04/24/16 0406 04/25/16 0410  WBC 15.2* 31.8*  HGB 8.1* 8.1*  HCT 24.6* 24.4*  PLT 124* 131*   BMET  Recent Labs  04/24/16 0406 04/25/16 0410  NA 130* 129*  K 3.3* 3.3*  CL 102 102  CO2 20* 19*  GLUCOSE 108* 117*  BUN 99* 110*  CREATININE 1.94* 1.99*  CALCIUM 7.7* 7.8*   PT/INR  Recent Labs  04/24/16 0406 04/25/16 0410  LABPROT 14.4 15.9*  INR 1.12 1.26   ABG No results for input(s): PHART, HCO3 in the last 72 hours.  Invalid input(s): PCO2, PO2  Studies/Results: No results found.  Anti-infectives: Anti-infectives    Start     Dose/Rate Route Frequency Ordered Stop   05/18/2016 1015  cefTRIAXone (ROCEPHIN) 2 g in dextrose 5 % 50 mL IVPB     2 g 100 mL/hr over 30 Minutes Intravenous  Once 04/29/2016 1010 05/03/2016 1045   04/22/2016 1000  cefTRIAXone (ROCEPHIN) 2 g in dextrose 5 % 50 mL IVPB     2 g 100 mL/hr over 30 Minutes Intravenous Every 24 hours 05/16/2016 0846 04/28/16 2359   04/17/16 1900  vancomycin (VANCOCIN) 500 mg in sodium chloride 0.9 % 100 mL IVPB     500 mg 100 mL/hr over 60 Minutes  Intravenous Every 24 hours 04/17/16 1748 05/11/2016 1924   04/13/16 2130  fluconazole (DIFLUCAN) IVPB 200 mg  Status:  Discontinued     200 mg 100 mL/hr over 60 Minutes Intravenous Every 24 hours 04/13/16 2033 04/19/16 0939   04/13/16 1700  vancomycin (VANCOCIN) IVPB 750 mg/150 ml premix  Status:  Discontinued     750 mg 150 mL/hr over 60 Minutes Intravenous Every 24 hours 04/12/16 1814 04/17/16 1748   04/12/16 2000  fluconazole (DIFLUCAN) tablet 200 mg  Status:  Discontinued     200 mg Oral Daily 04/12/16 1758 04/13/16 2028   04/12/16 1400  meropenem (MERREM) 1 g in sodium chloride 0.9 % 100 mL IVPB  Status:  Discontinued     1 g 200 mL/hr over 30 Minutes Intravenous Every 8 hours 04/12/16 1150 05/18/2016 0846   04/10/16 0500  meropenem (MERREM) 1 g in sodium chloride 0.9 % 100 mL IVPB  Status:  Discontinued     1 g 200 mL/hr over 30 Minutes Intravenous Every 12 hours 03/26/2016 1654 04/12/16 1150   04/10/16 0500  vancomycin (VANCOCIN) 500 mg in sodium chloride 0.9 % 100 mL IVPB  Status:  Discontinued     500 mg 100 mL/hr over 60 Minutes Intravenous Every 12 hours 04/14/2016 1654 04/12/16 1814  04/01/2016 1700  meropenem (MERREM) 1 g in sodium chloride 0.9 % 100 mL IVPB     1 g 200 mL/hr over 30 Minutes Intravenous  Once 04/10/2016 1638 04/04/2016 1730   03/26/2016 1700  vancomycin (VANCOCIN) IVPB 1000 mg/200 mL premix  Status:  Discontinued     1,000 mg 200 mL/hr over 60 Minutes Intravenous  Once 04/17/2016 1638 04/06/2016 1641   04/14/2016 1700  vancomycin (VANCOCIN) 1,250 mg in sodium chloride 0.9 % 250 mL IVPB     1,250 mg 166.7 mL/hr over 90 Minutes Intravenous  Once 03/23/2016 1641 03/18/2016 1900      Assessment/Plan: S/p colostomy takedown at [facility] S/p multiple explorations, SB resections, SMA bypass S/p SB anastomosis x2 on 8/1 EXPLORATORY LAPAROTOMY,  CLOSURE OF ABDOMEN 8/4  Cont TPN Start TRICKLE tube feeds Discussed foley/thigh with nurse  Leighton Ruff. Redmond Pulling, MD, FACS General, Bariatric,  & Minimally Invasive Surgery [facility], [place]   LOS: 16 days    Gayland Curry 04/25/2016

## 2016-04-25 NOTE — Progress Notes (Signed)
PARENTERAL NUTRITION CONSULT NOTE - FOLLOW UP  Pharmacy Consult for TPN Indication: massive bowel resection  Patient Measurements: Height: '5\' 6"'$  (167.6 cm) Weight: 154 lb 15.7 oz (70.3 kg) IBW/kg (Calculated) : 59.3 Adjusted Body Weight: 63.9 kg  Vital Signs: Temp: 97.6 F (36.4 C) (08/08 0345) Temp Source: Oral (08/08 0345) BP: 146/52 (08/08 0700) Pulse Rate: 78 (08/08 0700) Intake/Output from previous day: 08/07 0701 - 08/08 0700 In: 2200 [I.V.:1870; NG/GT:30; IV Piggyback:300] Out: 2390 [Urine:1890; Emesis/NG output:120; Drains:380] Intake/Output from this shift: No intake/output data recorded.  Insulin Requirements in the past 24 hours:  0 units of sensitive SSI  Assessment: 75 yo F w/ bowel ischemia, SMA thrombus, s/p OR 7/29 w/ mesenteric artery bypass and SBR, OR 7/30 2 more short segments of small bowel removed- per CCS note overall most of small bowel viable, SMA graft pulse palpable. Now s/p abdominal closure on TPN for severe protein calorie malnutrition.   GI: Post op day #4 after second ex lap and abdominal closure. NG output 120 ml & drain output 359m yesterday which is up some. Albumin up 1 >>1.2. Prealbumin up 5.7>> 7.4. Last BM was on 7/27.  Endo: CBGs controlled (74-117) on sensitive SSI. Off steroids. Pepcid in TPN  Lytes: Na low at 129. Phos ok at 3.8. K remains low at 3.3 and Mg up some to 1.9 after replacement yesterday with no electrolytes in TPN. CoCa 10. Has PRN replacements ordered for Mg and K.  Renal: SCr up to 1.99 s/p lasix IV trials 8/5 & 8/6, CrCl ~20-244mmin. UOP 1.1 yesterday without lasix. Net + ~17.5L this admission. BUN high 110-could be indicator that patient is not clearing protein well and will monitor closely. Additional IV fluids stopped 8/5. RLE serous drainage noted - likely due ot anasarca.   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 Trach placed 8/4- remains on vent, Fi02 of 40%  Cards: BP ok and HR 70s to 80s (1 episode  of 141 this AM) in Afib. Neo off 8/5. 7/24 TTE- EF 50-55%, mild MR regurg.  Wt stable.   Hepatobil: AST and ALT bumped (165, 90). TBili bumped 5.1. Significant jaundice on exam- will recheck Tbili in AM. Trig wnl.  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 Prn Fentanyl. GCS down to 8, RASS -1 (goal 0) 8/2 EEG- global encephalopathic process, non-specific etiology.   ID: S/P 10 days of vancomycin for MRSA PNA. Repeat cultures negative. Now on ceftriaxone d#8. Afebrile, WBC up again at 31.8. Not covering anaerobes if concerned for intra-abdominal. Also, fungal risk with TPN.   Best Practices: SCDs, famotidine TPN Access: PICC triple lumen TPN start date: 7/30 >>  Current Nutrition: NPO Clinimix 5/15 at 7562mr + 20% fat emulsion @ 10 ml/hr(Provides 90g of protein and 1484 kcals per day -82% of protein needs and >100% of kcal needs)  Nutritional Goals: per RD recs on 8/4 Kcal: 1287 Protein: 105-115 g  Plan:  Continue Clinimix E 5/15 at 35m39m - adding back lytes today and reassess in AM. Continue 20% IV fat emulsions on MWF at 10ml49m-no lipids today. Due to hypocaloric / high protein goal we will not be able to meet patient's needs with Clinimix. This would provide a daily average of 90g of protein and 1484 kcal per day meeting 82% of protein and > 100% of kcal needs.  Add MVI and half TE (jaundice on exam with elevated Tbili) in TPN Continue famotidine '20mg'$  in TPN Continue sensitive SSI and adjust as needed  Repeat CMET, Mg and Phos in AM. F/U improvement after massive bowel resection   Sloan Leiter, PharmD, BCPS Clinical Pharmacist [phone] 04/25/2016 7:34 AM

## 2016-04-25 NOTE — Progress Notes (Signed)
PULMONARY / CRITICAL CARE MEDICINE   Name: Amy [name] MRN: [id number] DOB: Feb 20, 1941    ADMISSION DATE:  03/19/2016 CONSULTATION DATE:  7/23  REFERRING MD:  [facility] hospital per family request   CHIEF COMPLAINT:  Acute encephalopathy and progressive respiratory failure   HISTORY OF PRESENT ILLNESS:   This is a 75 year old white female who was initially admitted to [facility] hospital where she underwent an elective loop colostomy takedown on 7/18. Her immediate post-op course was c/b lethargy and actually required narcan in PACU. She never really improved w/ hospital notes continuing to raise concern for altered mental status and difficulty to arouse. She ultimately required emergent intubation for hypercarbia (CO2 52) and inability to protect airway. She was transferred per family request on 7/23 for further evaluation and care. MRI confirmed new CVA. Suspect this was multifactorial: CVA, meds, +/- pneumonia. She was extubated on 7/24 and transferred to the SDU. On 7/28, she developed worsening abdominal pain and leukocytosis. 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 On 7/29, she underwent aorto to superior mesenteric artery bypass with greater saphenous vein graft and small bowel resection. In the immediate post-op period, she became hypotensive to the 50s and was started on pressors. She was transferred back to [facility] service. After multiple surgeries her abdomen was closed on 8/4 as her gut appeared healthy at that point, tracheostomy placed on 8/4.  SUBJECTIVE:   Will try t collar    VITAL SIGNS: BP (!) 127/99   Pulse (!) 105   Temp 97.6 F (36.4 C) (Oral)   Resp (!) 21   Ht '5\' 6"'$  (1.676 m)   Wt 154 lb 15.7 oz (70.3 kg)   SpO2 100%   BMI 25.01 kg/m   HEMODYNAMICS:    VENTILATOR SETTINGS: Vent Mode: PSV;CPAP FiO2 (%):  [40 %] 40 % Set Rate:  [14 bmp] 14 bmp Vt Set:  [480 mL] 480 mL PEEP:  [5 cmH20] 5 cmH20 Pressure Support:  [8  cmH20] 8 cmH20 Plateau Pressure:  [11 cmH20-19 cmH20] 14 cmH20  INTAKE / OUTPUT: I/O last 3 completed shifts: In: 3875 [I.V.:2890; NG/GT:60; IV Piggyback:300] Out: 6433 [Urine:3190; Emesis/NG output:170; Drains:480]  PHYSICAL EXAMINATION: General: on vent, no distress, no follows commands HENT: tracheostomy clean, dry PULM: CTA B, vent supported breaths, decreased bs in bases CV: RRR, no mgr GI: midline scar with wound vac,wound vac to right thigh Derm: massive anasarca, some scattered bruising, scleral icterus? Neuro: nods to questions, deep breath on command but then quickly back to sleep LABS:  BMET  Recent Labs Lab 04/23/16 0415 04/24/16 0406 04/25/16 0410  NA 129* 130* 129*  K 4.7 3.3* 3.3*  CL 103 102 102  CO2 20* 20* 19*  BUN 89* 99* 110*  CREATININE 1.71* 1.94* 1.99*  GLUCOSE 110* 108* 117*    Electrolytes  Recent Labs Lab 04/23/16 0415 04/24/16 0406 04/25/16 0410  CALCIUM 7.8* 7.7* 7.8*  MG 1.8 1.7 1.9  PHOS 4.0 3.8 3.8    CBC  Recent Labs Lab 04/23/16 0415 04/24/16 0406 04/25/16 0410  WBC 36.2* 15.2* 31.8*  HGB 8.7* 8.1* 8.1*  HCT 27.1* 24.6* 24.4*  PLT 119* 124* 131*    Coag's  Recent Labs Lab 04/23/16 0415 04/24/16 0406 04/25/16 0410  APTT 35 33 32  INR 1.21 1.12 1.26    Sepsis Markers No results for input(s): LATICACIDVEN, PROCALCITON, O2SATVEN in the last 168 hours.  ABG  Recent Labs Lab 04/19/16  0432 05/15/2016 1436 04/27/2016 1535  PHART 7.341* 7.383 7.302*  PCO2ART 48.1* 42.4 45.6*  PO2ART 135.0* 505.0* 129.0*    Liver Enzymes  Recent Labs Lab 05/15/2016 0500 04/22/16 0808 04/24/16 0406  AST 109* 78* 165*  ALT 67* 51 90*  ALKPHOS 129* 128* 170*  BILITOT 3.7* 2.9* 5.1*  ALBUMIN 1.3* 1.0* 1.2*    Cardiac Enzymes No results for input(s): TROPONINI, PROBNP in the last 168 hours.  Glucose  Recent Labs Lab 04/24/16 0743 04/24/16 1242 04/24/16 1634 04/24/16 1929 04/24/16 2333 04/25/16 0343  GLUCAP 101*  105* 112* 74 111* 113*    Imaging No results found. STUDIES:  CT head (at [facility]): negative  MRI brain  7/23: Bilateral cerebral hemispheres and tight cerebellar hemisphere patchy acute and early subacute infarcts. No mass effect or hemorrhage. Central thromboembolic source is suspected.  EEG 7/23: Abnormal due to moderate diffuse slowing of the waking background. No seizure or epiletiform discharges.  TTE 7/24: EF 50-55%, lateral wall appears hypokinetic, systolic function normal, mild MR regurg Carotid US 7/24: right mild soft plaque CCA, mild to moderate plaque origin and proximal ICA and ECA 1-39% ICA stenosis.  EEG 8/2>>>enceph, no focus  MICROBIOLOGY: MRSA PCR 7/23:  Positive  Urine Ctx 7/23:  Negative  Blood Ctx x2 7/23:  Negative  Tracheal Asp Ctx 7/23:  MRSA Blood Ctx x2 7/28>>> NGTD Urine Ctx 7/28:  Negative  ANTIBIOTICS: Vancomycin 7/23 >>>10 days  Meropenem 7/23 >> 8/1 Diflucan 7/27 >>>8/2 Ceftriaxone 8/1 >>>  SIGNIFICANT EVENTS: Loop colostomy takedown 7/18 7/18-7/22: progressively lethargic  7/23 transferred to [facility] s/p getting intubated for AMS and hypercarbia. Surgical service consulted at [facility] on arrival  7/24 on CCB gtt for af. But fully awake and following commands. Extubated. 7/25 SLP evaluation for diet, ASA started, FEES 7/28 developed worsening abdominal pain, leukocytosis, CT showing complete SMA occlusion 7/29 Underwent aorto to SMA bypass with greater saphenous vein graft and small bowel resection 7/29 Developed hypotension to the 50s in post-op period, started on pressors 7/29 Code status changed to DNR 7/30 Ex Lap by CCS w/ necrotic bowel & ischemia s/p resection w/ intact bypass & oozing over peritoneal surfaces. 8/1 OR, limited small bowel resection, anastamosis x2, vascular graft intact 8/3 neo needed again 8/4 - belly closed, Tracheostomy placed, low UOP  LINES/TUBES: OETT 7/23>>>7/24; 7/29 >>8/4 R IJ CVL 7/19 >> 8/5 L Radial Art Line  7/29 >> 8/1 patient pulled R Radial Art Line 8/1 >>>plan to dc post op 8 /4 FOLEY 7/29 >> R NGT 7/29 >> IABP 7/29 >> out (date?) Tracheostomy 8/4 >>  ASSESSMENT / PLAN:  PULMONARY A: Acute Hypercarbic Respiratory Failure Acute Hypoxic Respiratory Failure MRSA Pneumonia H/O COPD H/O OSA (intolerant of CPAP) H/O RUL Lobectomy - For NSCLC P:   Try trach collar 8/8 VAP prevention Trach care  CARDIOVASCULAR A:  Shock -SIRS / sedation > resolving Atrial Fibrillation Elevated Troponin I - Likely demand ischemia H/O HTN  H/O Bilateral ICA Stenosis P:  Off hydrocortisone Tele Monitor BP  GASTROINTESTINAL A:   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 H/O GERD  P:   TPN per surgery(consider trickle feeds per CCS) Pepcid IV q12hr Nutrition per surgery Wound vac in place, pain control  RENAL Lab Results  Component Value Date   CREATININE 1.99 (H) 04/25/2016   CREATININE 1.94 (H) 04/24/2016   CREATININE 1.71 (H) 04/23/2016  A:   ARF, ATN  Anasarca Hypokalemia  P:   Lasix given x 1 on 8/6, hold 8/8, may need lasix if UOP drops  Continue foley Monitor UOP Replace K+  HEMATOLOGIC A:   Leukocytosis > steroid related? Anemia improving Thrombocytopenia improving  P:  Monitor for bleeding Transfuse for Hgb < 10  INFECTIOUS A:   WBC up 8/5, better 8/6 source? Steroids? 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   P:   Goal RASS: 0 Fentanyl IV prn only   FAMILY UPDATE: Husband updated  8/6  SRichardson LandryMinor ACNP LMaryanna ShapePCCM Pager [phone] 3 pm If no answer page [phone]/04/2016, 9:10 AM   STAFF NOTE: I, DMerrie Roof MD FACP have personally reviewed patient's available data, including medical history, events of note, physical examination and test results as part of my evaluation. I have discussed with resident/NP and other care providers such as pharmacist, RN and RRT. In addition, I personally evaluated patient and elicited key findings of: awake, follows commands, trach clean overall, abdo soft, wound wnl, edema remains, I evaluated her on PS weaning her volumes are 900 plus and rate wnl, would move to trach collar attempt with direct observation by RT in room, goal 1-2 hours, if successful with her mental status would support PMV and secretion status low, I think that wbc yesterday of 15 k was error, likely overal trend is down to 31 k from 37 k , bun (tpn also contribution) noted , remains with edema, holding lasix today as she is progressing from a resp standpoint, if output drops would re add lasix however, would like to discuss with surgery role for trickel feeds if able, k supp, I updated her husband in the room The patient is critically ill with multiple organ systems failure and requires high complexity decision making for assessment and support, frequent evaluation and titration of therapies, application of advanced monitoring technologies and extensive interpretation of multiple databases.   Critical Care Time devoted to patient care services described in this note is 30  Minutes. This time reflects time of care of this signee: DMerrie Roof MD FACP. This critical care time does not reflect procedure time, or teaching time or supervisory time of PA/NP/Med student/Med Resident etc but could involve care discussion time. Rest per NP/medical resident whose note is outlined above and that I agree with   DLavon Paganini FTitus Mould MD, FMossyrockPgr: [facility] 04/25/2016 9:52 AM

## 2016-04-26 ENCOUNTER — Inpatient Hospital Stay (HOSPITAL_COMMUNITY): Payer: Medicare Other

## 2016-04-26 LAB — CBC WITH DIFFERENTIAL/PLATELET
BASOS ABS: 0 10*3/uL (ref 0.0–0.1)
Basophils Relative: 0 %
Eosinophils Absolute: 0.3 10*3/uL (ref 0.0–0.7)
Eosinophils Relative: 1 %
HCT: 24 % — ABNORMAL LOW (ref 36.0–46.0)
Hemoglobin: 8.1 g/dL — ABNORMAL LOW (ref 12.0–15.0)
LYMPHS ABS: 0.6 10*3/uL — AB (ref 0.7–4.0)
Lymphocytes Relative: 2 %
MCH: 30 pg (ref 26.0–34.0)
MCHC: 33.8 g/dL (ref 30.0–36.0)
MCV: 88.9 fL (ref 78.0–100.0)
MONO ABS: 1.3 10*3/uL — AB (ref 0.1–1.0)
MONOS PCT: 4 %
NEUTROS PCT: 93 %
Neutro Abs: 29.5 10*3/uL — ABNORMAL HIGH (ref 1.7–7.7)
PLATELETS: 139 10*3/uL — AB (ref 150–400)
RBC: 2.7 MIL/uL — AB (ref 3.87–5.11)
RDW: 16.8 % — ABNORMAL HIGH (ref 11.5–15.5)
WBC: 31.7 10*3/uL — AB (ref 4.0–10.5)

## 2016-04-26 LAB — BASIC METABOLIC PANEL
Anion gap: 8 (ref 5–15)
BUN: 111 mg/dL — AB (ref 6–20)
CALCIUM: 8.1 mg/dL — AB (ref 8.9–10.3)
CO2: 19 mmol/L — ABNORMAL LOW (ref 22–32)
CREATININE: 1.87 mg/dL — AB (ref 0.44–1.00)
Chloride: 102 mmol/L (ref 101–111)
GFR calc Af Amer: 29 mL/min — ABNORMAL LOW (ref >60)
GFR, EST NON AFRICAN AMERICAN: 25 mL/min — AB (ref >60)
Glucose, Bld: 106 mg/dL — ABNORMAL HIGH (ref 65–99)
Potassium: 3.5 mmol/L (ref 3.5–5.1)
SODIUM: 129 mmol/L — AB (ref 135–145)

## 2016-04-26 LAB — GLUCOSE, CAPILLARY
GLUCOSE-CAPILLARY: 112 mg/dL — AB (ref 65–99)
GLUCOSE-CAPILLARY: 116 mg/dL — AB (ref 65–99)
GLUCOSE-CAPILLARY: 91 mg/dL (ref 65–99)
Glucose-Capillary: 118 mg/dL — ABNORMAL HIGH (ref 65–99)
Glucose-Capillary: 101 mg/dL — ABNORMAL HIGH (ref 65–99)
Glucose-Capillary: 98 mg/dL (ref 65–99)

## 2016-04-26 LAB — PROTIME-INR
INR: 1.26
Prothrombin Time: 15.9 seconds — ABNORMAL HIGH (ref 11.4–15.2)

## 2016-04-26 LAB — FIBRINOGEN: FIBRINOGEN: 661 mg/dL — AB (ref 210–475)

## 2016-04-26 LAB — PHOSPHORUS: PHOSPHORUS: 4.6 mg/dL (ref 2.5–4.6)

## 2016-04-26 LAB — APTT: aPTT: 33 seconds (ref 24–36)

## 2016-04-26 LAB — MAGNESIUM: Magnesium: 1.8 mg/dL (ref 1.7–2.4)

## 2016-04-26 MED ORDER — FUROSEMIDE 10 MG/ML IJ SOLN
60.0000 mg | Freq: Two times a day (BID) | INTRAMUSCULAR | Status: DC
  Administered 2016-04-26 (×2): 60 mg via INTRAVENOUS
  Filled 2016-04-26 (×2): qty 6

## 2016-04-26 MED ORDER — CLINIMIX E/DEXTROSE (5/15) 5 % IV SOLN
INTRAVENOUS | Status: AC
  Administered 2016-04-26: 17:00:00 via INTRAVENOUS
  Filled 2016-04-26: qty 1800

## 2016-04-26 MED ORDER — VITAL HIGH PROTEIN PO LIQD
1000.0000 mL | ORAL | Status: DC
  Administered 2016-04-26 (×3)
  Administered 2016-04-26: 1000 mL
  Administered 2016-04-26 (×2)
  Administered 2016-04-27: 1000 mL

## 2016-04-26 NOTE — Consult Note (Signed)
[place] Nurse wound follow-up consult note Reason for Consult: Right thigh wound Vac dressing change.  Applied Vac to contain drainage to right thigh site; unchanged since previous assessment. Applied one piece black foam to the wound, then drape to protect over the staples which are located next to the wound, then another piece of black foam to 163m over the incisional staples area which is weeping mod amt yellow drainage.    Pt also has a Vac dressing to full thickness post-op abd wounds; wound appearance unchanged since previous assessment. Mod amt pink drainage in the cannister.  Applied 1 piece black foam and bridged to another full thickness wound on the left abd; 2 pieces black foam applied. Both Vac dressings Yed together to one machine.  WOC will plan to change dressings Q M/W/F. Pt was medicated prior to procedure and tolerated with mod amt pain.  DJulien GirtMSN, RN, [place]rook[place] CB[place]

## 2016-04-26 NOTE — Progress Notes (Addendum)
Nutrition Consult/Follow Up  DOCUMENTATION CODES:   Not applicable  INTERVENTION:    Initiate Vital High Protein formula at 20 ml/hr    TPN per pharmacy  NUTRITION DIAGNOSIS:   Inadequate oral intake related to inability to eat as evidenced by NPO status, ongoing  GOAL:   Patient will meet greater than or equal to 90% of their needs, progressing   MONITOR:   TF tolerance, Vent status, Labs, Weight trends, I & O's, Skin, TPN prescription   ASSESSMENT:   75 y.o. Female who presented for evaluation of abdominal pain x 1 day. 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 Her KUB was suggestive of an ileus. She is s/p elective colostomy takedown at [facility] on 04/04/16. Her post-op course was complicated altered mental status, lethargy and respiratory distress requiring emergent intubation. She was transferred to [facility] on 04/03/2016 per family's request.    Patient s/p procedure 7/30: EXPLORATORY LAPAROTOMY SMALL BOWEL RESECTION x 2 PLACEMENT OF ABDOMINAL WOUND VAC  Patient is currently intubated on ventilator support >> trach MV: 8.9 L/min Temp (24hrs), Avg:97.3 F (36.3 C), Min:96.5 F (35.8 C), Max:97.8 F (36.6 C)  S/p small bowel resection, anastomosis and re-application of wound VAC 8/1, abdominal closure 8/4. CWOCN note 8/7 reviewed >> pt with full thickness R thigh and post-op abd wounds. RD consulted to initiate trickle tube feeding via NGT.  Patient is receiving TPN via PICC line with Clinimix E 5/15 @ 75 ml/hr. Lipids (20% IVFE) on MWF at 10 ml/hr.  Provides 1484 kcal and 90 grams protein per day. Meets 100% minimum estimated energy needs and 85% minimum estimated protein needs.  Diet Order:  Diet NPO time specified .TPN (CLINIMIX-E) Adult TPN (CLINIMIX-E) Adult  Skin:   NPWT to abdomen and R thigh   Last BM:  N/A  Height:   Ht Readings from Last 1 Encounters:  03/27/2016 '5\' 6"'$  (1.676 m)    Weight:   Wt  Readings from Last 1 Encounters:  04/26/16 151 lb 14.4 oz (68.9 kg)    Ideal Body Weight:  59 kg  BMI:  Body mass index is 24.52 kg/m.  Estimated Nutritional Needs:   Kcal:  1312  Protein:  105-115 gm  Fluid:  per MD  EDUCATION NEEDS:   No education needs identified at this time  Arthur Holms, RD, LDN Pager #: [phone] After-Hours Pager #: [phone]

## 2016-04-26 NOTE — Progress Notes (Signed)
SLP Cancellation Note  Patient Details Name: Amy [name] MRN: [id number] DOB: [birth year]   Cancelled treatment:       Reason Eval/Treat Not Completed: Medical issues which prohibited therapy. Orders received for PMV evaluation. Note that she was on trach collar yesterday, but she is currently on the vent. Will f/u on next date for readiness.   [name] 04/26/2016, 4:03 PM  [name], M.A. CCC-SLP [phone]

## 2016-04-26 NOTE — Progress Notes (Signed)
Unable to return '1mg'$  dilaudid IV into Pyxis; attempted to waste in [place] as well and unable to do so.  Wasten '1mg'$  Dilaudid IV into sharps container.  Witnessed by [employer].

## 2016-04-26 NOTE — Progress Notes (Signed)
SLP Note:  SLP following pt as part of trach team. Per chart, note that she began trach collar trials on previous date. MD, please consider ordering PMV evaluation when appropriate.   [name], M.A. CCC-SLP [phone]

## 2016-04-26 NOTE — Progress Notes (Signed)
5 Days Post-Op  Subjective: No events.   Objective: Vital signs in last 24 hours: Temp:  [96.5 F (35.8 C)-97.8 F (36.6 C)] 96.7 F (35.9 C) (08/09 0755) Pulse Rate:  [25-132] 77 (08/09 0752) Resp:  [12-30] 16 (08/09 0752) BP: (99-169)/(43-108) 118/83 (08/09 0752) SpO2:  [96 %-100 %] 100 % (08/09 0755) FiO2 (%):  [40 %] 40 % (08/09 0755) Weight:  [68.9 kg (151 lb 14.4 oz)] 68.9 kg (151 lb 14.4 oz) (08/09 0305) Last BM Date:  (none since surgery)  Intake/Output from previous day: 08/08 0701 - 08/09 0700 In: 2390 [I.V.:2150; NG/GT:90; IV Piggyback:150] Out: 2370 [Urine:1920; Emesis/NG output:50; Drains:400] Intake/Output this shift: Total I/O In: 170 [I.V.:170] Out: 225 [Urine:125; Drains:100]  Soft, nd, wound vac intact. Some grimacing to palpation  Lab Results:   Recent Labs  04/25/16 0410 04/26/16 0410  WBC 31.8* 31.7*  HGB 8.1* 8.1*  HCT 24.4* 24.0*  PLT 131* 139*   BMET  Recent Labs  04/25/16 0410 04/26/16 0410  NA 129* 129*  K 3.3* 3.5  CL 102 102  CO2 19* 19*  GLUCOSE 117* 106*  BUN 110* 111*  CREATININE 1.99* 1.87*  CALCIUM 7.8* 8.1*   PT/INR  Recent Labs  04/25/16 0410 04/26/16 0410  LABPROT 15.9* 15.9*  INR 1.26 1.26   ABG No results for input(s): PHART, HCO3 in the last 72 hours.  Invalid input(s): PCO2, PO2  Studies/Results: Dg Chest Port 1 View  Result Date: 04/26/2016 CLINICAL DATA:  Respiratory failure. EXAM: PORTABLE CHEST 1 VIEW COMPARISON:  04/20/2016. FINDINGS: Interim removal right IJ line. Tracheostomy tube, NG tube in stable position. Left PICC line stable position. Heart size stable. Postsurgical changes right lung Left lower lobe infiltrate with small left pleural effusion. Mild right base atelectasis with interim improvement of small right pleural effusion. No pneumothorax. IMPRESSION: 1. Interim removal of right IJ line. Remaining lines and tubes in stable position. 2. Postsurgical changes right lung . Left lower lobe  infiltrate and small left pleural effusion with slight progression from prior exam. Mild right base subsegmental atelectasis and small right pleural effusion. Electronically Signed   By: Marcello Moores  Register   On: 04/26/2016 07:22    Anti-infectives: Anti-infectives    Start     Dose/Rate Route Frequency Ordered Stop   04/20/2016 1015  cefTRIAXone (ROCEPHIN) 2 g in dextrose 5 % 50 mL IVPB     2 g 100 mL/hr over 30 Minutes Intravenous  Once 04/20/2016 1010 05/13/2016 1045   04/19/2016 1000  cefTRIAXone (ROCEPHIN) 2 g in dextrose 5 % 50 mL IVPB     2 g 100 mL/hr over 30 Minutes Intravenous Every 24 hours 04/24/2016 0846 04/28/16 2359   04/17/16 1900  vancomycin (VANCOCIN) 500 mg in sodium chloride 0.9 % 100 mL IVPB     500 mg 100 mL/hr over 60 Minutes Intravenous Every 24 hours 04/17/16 1748 05/16/2016 1924   04/13/16 2130  fluconazole (DIFLUCAN) IVPB 200 mg  Status:  Discontinued     200 mg 100 mL/hr over 60 Minutes Intravenous Every 24 hours 04/13/16 2033 04/19/16 0939   04/13/16 1700  vancomycin (VANCOCIN) IVPB 750 mg/150 ml premix  Status:  Discontinued     750 mg 150 mL/hr over 60 Minutes Intravenous Every 24 hours 04/12/16 1814 04/17/16 1748   04/12/16 2000  fluconazole (DIFLUCAN) tablet 200 mg  Status:  Discontinued     200 mg Oral Daily 04/12/16 1758 04/13/16 2028   04/12/16 1400  meropenem (MERREM) 1  g in sodium chloride 0.9 % 100 mL IVPB  Status:  Discontinued     1 g 200 mL/hr over 30 Minutes Intravenous Every 8 hours 04/12/16 1150 05/02/2016 0846   04/10/16 0500  meropenem (MERREM) 1 g in sodium chloride 0.9 % 100 mL IVPB  Status:  Discontinued     1 g 200 mL/hr over 30 Minutes Intravenous Every 12 hours 04/13/2016 1654 04/12/16 1150   04/10/16 0500  vancomycin (VANCOCIN) 500 mg in sodium chloride 0.9 % 100 mL IVPB  Status:  Discontinued     500 mg 100 mL/hr over 60 Minutes Intravenous Every 12 hours 04/14/2016 1654 04/12/16 1814   04/03/2016 1700  meropenem (MERREM) 1 g in sodium chloride 0.9 %  100 mL IVPB     1 g 200 mL/hr over 30 Minutes Intravenous  Once 04/04/2016 1638 03/27/2016 1730   03/20/2016 1700  vancomycin (VANCOCIN) IVPB 1000 mg/200 mL premix  Status:  Discontinued     1,000 mg 200 mL/hr over 60 Minutes Intravenous  Once 03/29/2016 1638 03/22/2016 1641   03/28/2016 1700  vancomycin (VANCOCIN) 1,250 mg in sodium chloride 0.9 % 250 mL IVPB     1,250 mg 166.7 mL/hr over 90 Minutes Intravenous  Once 04/01/2016 1641 03/27/2016 1900      Assessment/Plan: S/p colostomy takedown at [facility] S/p multiple explorations, SB resections, SMA bypass S/p SB anastomosis x2 on 8/1 EXPLORATORY LAPAROTOMY, CLOSURE OF ABDOMEN 8/4  Cont TPN Start trickle tube feeds Discussed with CCM Discussed with family  Leighton Ruff. Redmond Pulling, MD, FACS General, Bariatric, & Minimally Invasive Surgery [facility], [place]   LOS: 17 days    Gayland Curry 04/26/2016

## 2016-04-26 NOTE — Progress Notes (Signed)
PULMONARY / CRITICAL CARE MEDICINE   Name: Amy [name] MRN: [id number] DOB: 1941/04/25    ADMISSION DATE:  04/17/2016 CONSULTATION DATE:  7/23  REFERRING MD:  [facility] per family request   CHIEF COMPLAINT:  Acute encephalopathy and progressive respiratory failure   HISTORY OF PRESENT ILLNESS:   This is a 75 year old white female who was initially admitted to [facility] hospital where she underwent an elective loop colostomy takedown on 7/18. Her immediate post-op course was c/b lethargy and actually required narcan in PACU. She never really improved w/ hospital notes continuing to raise concern for altered mental status and difficulty to arouse. She ultimately required emergent intubation for hypercarbia (CO2 52) and inability to protect airway. She was transferred per family request on 7/23 for further evaluation and care. MRI confirmed new CVA. Suspect this was multifactorial: CVA, meds, +/- pneumonia. She was extubated on 7/24 and transferred to the SDU. On 7/28, she developed worsening abdominal pain and leukocytosis. 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 On 7/29, she underwent aorto to superior mesenteric artery bypass with greater saphenous vein graft and small bowel resection. In the immediate post-op period, she became hypotensive to the 50s and was started on pressors. She was transferred back to [facility] service. After multiple surgeries her abdomen was closed on 8/4 as her gut appeared healthy at that point, tracheostomy placed on 8/4.  SUBJECTIVE:   Will try t collar    VITAL SIGNS: BP 118/83   Pulse 77   Temp (!) 96.7 F (35.9 C) (Oral)   Resp 16   Ht '5\' 6"'$  (1.676 m)   Wt 151 lb 14.4 oz (68.9 kg)   SpO2 100%   BMI 24.52 kg/m   HEMODYNAMICS:    VENTILATOR SETTINGS: Vent Mode: PRVC FiO2 (%):  [40 %] 40 % Set Rate:  [14 bmp] 14 bmp Vt Set:  [480 mL] 480 mL PEEP:  [5 cmH20] 5 cmH20 Pressure Support:  [8 cmH20] 8  cmH20 Plateau Pressure:  [10 cmH20-16 cmH20] 10 cmH20  INTAKE / OUTPUT: I/O last 3 completed shifts: In: 6160 [I.V.:3085; NG/GT:120; IV Piggyback:150] Out: 7371 [Urine:2685; Emesis/NG output:150; Drains:550]  PHYSICAL EXAMINATION: General: on vent, no distress, no follows commands HENT: tracheostomy clean, dry PULM: CTA B, vent supported breaths, decreased bs in bases CV: RRR, no mgr GI: midline scar with wound vac,wound vac to right thigh Derm: massive anasarca, some scattered bruising, scleral icterus? Neuro: nods to questions, deep breath on command but then quickly back to sleep LABS:  BMET  Recent Labs Lab 04/24/16 0406 04/25/16 0410 04/26/16 0410  NA 130* 129* 129*  K 3.3* 3.3* 3.5  CL 102 102 102  CO2 20* 19* 19*  BUN 99* 110* 111*  CREATININE 1.94* 1.99* 1.87*  GLUCOSE 108* 117* 106*    Electrolytes  Recent Labs Lab 04/24/16 0406 04/25/16 0410 04/26/16 0410  CALCIUM 7.7* 7.8* 8.1*  MG 1.7 1.9 1.8  PHOS 3.8 3.8 4.6    CBC  Recent Labs Lab 04/24/16 0406 04/25/16 0410 04/26/16 0410  WBC 15.2* 31.8* 31.7*  HGB 8.1* 8.1* 8.1*  HCT 24.6* 24.4* 24.0*  PLT 124* 131* 139*    Coag's  Recent Labs Lab 04/24/16 0406 04/25/16 0410 04/26/16 0410  APTT 33 32 33  INR 1.12 1.26 1.26    Sepsis Markers No results for input(s): LATICACIDVEN, PROCALCITON, O2SATVEN in the last 168 hours.  ABG  Recent Labs Lab 05/12/2016 1436 04/25/2016  1535  PHART 7.383 7.302*  PCO2ART 42.4 45.6*  PO2ART 505.0* 129.0*    Liver Enzymes  Recent Labs Lab 05/15/2016 0500 04/22/16 0808 04/24/16 0406  AST 109* 78* 165*  ALT 67* 51 90*  ALKPHOS 129* 128* 170*  BILITOT 3.7* 2.9* 5.1*  ALBUMIN 1.3* 1.0* 1.2*    Cardiac Enzymes No results for input(s): TROPONINI, PROBNP in the last 168 hours.  Glucose  Recent Labs Lab 04/24/16 2333 04/25/16 0343 04/25/16 0816 04/25/16 1201 04/25/16 1620 04/25/16 1942  GLUCAP 111* 113* 125* 106* 110* 106*     Imaging Dg Chest Port 1 View  Result Date: 04/26/2016 CLINICAL DATA:  Respiratory failure. EXAM: PORTABLE CHEST 1 VIEW COMPARISON:  05/14/2016. FINDINGS: Interim removal right IJ line. Tracheostomy tube, NG tube in stable position. Left PICC line stable position. Heart size stable. Postsurgical changes right lung Left lower lobe infiltrate with small left pleural effusion. Mild right base atelectasis with interim improvement of small right pleural effusion. No pneumothorax. IMPRESSION: 1. Interim removal of right IJ line. Remaining lines and tubes in stable position. 2. Postsurgical changes right lung . Left lower lobe infiltrate and small left pleural effusion with slight progression from prior exam. Mild right base subsegmental atelectasis and small right pleural effusion. Electronically Signed   By: Marcello Moores  Register   On: 04/26/2016 07:22   STUDIES:  CT head (at [facility]): negative  MRI brain  7/23: Bilateral cerebral hemispheres and tight cerebellar hemisphere patchy acute and early subacute infarcts. No mass effect or hemorrhage. Central thromboembolic source is suspected.  EEG 7/23: Abnormal due to moderate diffuse slowing of the waking background. No seizure or epiletiform discharges.  TTE 7/24: EF 50-55%, lateral wall appears hypokinetic, systolic function normal, mild MR regurg Carotid US 7/24: right mild soft plaque CCA, mild to moderate plaque origin and proximal ICA and ECA 1-39% ICA stenosis.  EEG 8/2>>>enceph, no focus  MICROBIOLOGY: MRSA PCR 7/23:  Positive  Urine Ctx 7/23:  Negative  Blood Ctx x2 7/23:  Negative  Tracheal Asp Ctx 7/23:  MRSA Blood Ctx x2 7/28>>> NGTD Urine Ctx 7/28:  Negative  ANTIBIOTICS: Vancomycin 7/23 >>>10 days  Meropenem 7/23 >> 8/1 Diflucan 7/27 >>>8/2 Ceftriaxone 8/1 >>>  SIGNIFICANT EVENTS: Loop colostomy takedown 7/18 7/18-7/22: progressively lethargic  7/23 transferred to [facility] s/p getting intubated for AMS and hypercarbia. Surgical  service consulted at [facility] on arrival  7/24 on CCB gtt for af. But fully awake and following commands. Extubated. 7/25 SLP evaluation for diet, ASA started, FEES 7/28 developed worsening abdominal pain, leukocytosis, CT showing complete SMA occlusion 7/29 Underwent aorto to SMA bypass with greater saphenous vein graft and small bowel resection 7/29 Developed hypotension to the 50s in post-op period, started on pressors 7/29 Code status changed to DNR 7/30 Ex Lap by CCS w/ necrotic bowel & ischemia s/p resection w/ intact bypass & oozing over peritoneal surfaces. 8/1 OR, limited small bowel resection, anastamosis x2, vascular graft intact 8/3 neo needed again 8/4 - belly closed, Tracheostomy placed, low UOP  LINES/TUBES: OETT 7/23>>>7/24; 7/29 >>8/4 R IJ CVL 7/19 >> 8/5 L Radial Art Line 7/29 >> 8/1 patient pulled R Radial Art Line 8/1 >>>plan to dc post op 8 /4 FOLEY 7/29 >> R NGT 7/29 >> IABP 7/29 >> out (date?) Tracheostomy 8/4 >>  ASSESSMENT / PLAN:  PULMONARY A: Acute Hypercarbic Respiratory Failure Acute Hypoxic Respiratory Failure MRSA Pneumonia H/O COPD H/O OSA (intolerant of CPAP) H/O RUL Lobectomy - For NSCLC P:   Try  trach collar 8/8 for 8 hours, repeat 8/9 VAP prevention Trach care  CARDIOVASCULAR A:  Shock -SIRS / sedation > resolving Atrial Fibrillation Elevated Troponin I - Likely demand ischemia H/O HTN  H/O Bilateral ICA Stenosis P:  Off hydrocortisone Tele Monitor BP  GASTROINTESTINAL A:   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 H/O GERD  P:   TPN per surgery, trickle feeds ok with ccs, ordered by ccm 8/9 Pepcid IV q12hr Nutrition per surgery Wound vac in place, pain control  RENAL Lab Results  Component Value Date   CREATININE 1.87 (H) 04/26/2016   CREATININE 1.99 (H) 04/25/2016   CREATININE 1.94 (H) 04/24/2016    A:   ARF, ATN   Anasarca Hypokalemia  P:   Lasix given x 1 on 8/6, hold 8/9, may need lasix if UOP drops  Continue foley Monitor UOP Replace K+  HEMATOLOGIC A:   Leukocytosis > steroid related?Remains elevated 8/9 31.7, afebrile Anemia improving Thrombocytopenia improving  P:  Monitor for bleeding Transfuse for Hgb < 10 Follow wbc  INFECTIOUS A:   WBC up 8/5, better 8/6 source? Steroids? 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   P:   Goal RASS: 0 Fentanyl IV prn only   FAMILY UPDATE: Husband updated 8/6  SRichardson LandryMinor ACNP LMaryanna ShapePCCM Pager [phone] 3 pm If no answer page [phone]/05/2016, 8:35 AM  STAFF NOTE: I, DMerrie Roof MD FACP have personally reviewed patient's available data, including medical history, events of note, physical examination and test results as part of my evaluation. I have discussed with resident/NP and other care providers such as pharmacist, RN and RRT. In addition, I personally evaluated patient and elicited key findings of: awakens, follows commands, pcxr with haziness LLL, pos balance again, slight reduction in last 5 days of crt, will re add lasix to goal even balance, goal trach collar is 12 hours, agree start TF trickle, would love to be able to dc tpn when at 50-60% cal intake oral, upright and daily PT would be ideal, I updated son in roomo in full, tune lights on daytime and off night, drop cuff when on trach collar and pmv, goal would be to dc vent in next 48 hrs, likley can go to sdu in am  The patient is critically ill with multiple organ systems failure and requires high  complexity decision making for assessment and support, frequent evaluation and titration of therapies, application of advanced monitoring technologies and extensive interpretation of multiple databases.   Critical Care Time devoted to patient care services described in this note is 30 Minutes. This time reflects time of care of this signee: DMerrie Roof MD FACP. This critical care time does not reflect procedure time, or teaching time or supervisory time of PA/NP/Med student/Med Resident etc but could involve care discussion time. Rest per NP/medical resident whose note is outlined above and that I agree with   DLavon Paganini FTitus Mould MD, FAshevillePgr: 3ParcPulmonary & Critical Care 04/26/2016 10:55 AM

## 2016-04-26 NOTE — Progress Notes (Addendum)
PARENTERAL NUTRITION CONSULT NOTE - FOLLOW UP  Pharmacy Consult:  TPN Indication:  Massive bowel resection  Patient Measurements: Height: '5\' 6"'$  (167.6 cm) Weight: 151 lb 14.4 oz (68.9 kg) IBW/kg (Calculated) : 59.3 Adjusted Body Weight: 63.9 kg  Vital Signs: Temp: 97.8 F (36.6 C) (08/09 0400) Temp Source: Oral (08/09 0400) BP: 118/83 (08/09 0745) Pulse Rate: 75 (08/09 0745) Intake/Output from previous day: 08/08 0701 - 08/09 0700 In: 2390 [I.V.:2150; NG/GT:90; IV Piggyback:150] Out: 2370 [Urine:1920; Emesis/NG output:50; Drains:400] Intake/Output from this shift: No intake/output data recorded.   Insulin Requirements in the past 24 hours:  1 unit SSI  Assessment: 35 YOF with bowel ischemia and SMA thrombus who is s/p mesenteric artery bypass and SBR on 03/27/2016.  She returned to the OR on 03/18/2016 for removal of 2 more short segments of small bowel.  Per CCS, overall most of small bowel is viable.  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   GI: GERD with Pepcid in TPN -  prealbumin up 5.7>7.4, LBM 7/27, NG O/P improving, drains O/P 459m.  RLE serous drainage noted - likely d/t anasarca.  Endo: hypothyroid on IV Synthroid, TSH 28 > 13 post dose increase.  No hx DM - CBGs controlled Lytes: low Na/CO2, CoCa WNL at 10.34 (Ca x Phos = 48, goal < 55), others WNL *days without lytes in TPN: 8/4, 8/5, 8/6, 8/7 Renal: SCr down 1.87, CrCL 24 mlm/in, BUN 111 (TPN providing 1.4 g/kg/d of protein) - good UOP 1.2 ml/kg/hr, net +17.5L since admit Pulm: COPD / OSA / NSCLC post RUL lobectomy. Trach placed 8/4 - remains on vent, FiO2 40% - Pulmicort, Duonebs Cards: HTN (EF 50-55%) - VSS, off pressor, PRN Lasix Hepatobil: LFTs bumped, tbili elevated at 5.1(significant jaundice on exam). TG/ lipase/amylase WNL AC: Eliquis for Afib and new CVA on hold - hgb unchanged at 8.1, plts improved to 139 Neuro: new CVA on 7/23, encephalopathy on 8/2 EEG - GCS 13, CPOT 0,  RASS 0 ID: s/p 10d vanc for MRSA PNA >> CTX D#9, repeat cx negative.  Afebrile, WBC up 31.7.  No coverage for anaerobes if concerned for intra-abd source.  Also, fungal risk with TPN.  Best Practices: SCDs, Pepcid, MC TPN Access: PICC triple lumen  TPN start date: 03/23/2016  Current Nutrition: TPN  Nutritional Goals: 1287 kCal and 105-115 g of protein   Plan:  - Continue Clinimix E 5/15 at 75 ml/hr and change lipids to 10 ml/hr on Tues and Thurs to minimize kCal provision.  TPN will provide a daily average of 1415 kCal and 90gm of protein per day, exceeding kCal by 9% and meeting 82% of protein needs.  Unable to meet patient's permissive underfeeding needs given limitation of Clinimix. - Watch BUN and may need to decrease protein provision - Daily multivitamin and half trace elements in TPN (jaundice) - Pepcid '20mg'$  daily in TPN - Continue sensitive SSI (consider discontinuing) - F/U AM labs and ability to start TF   Amy [name] PharmD, BCPS Pager:  [phone]/05/2016, 8:33 AM

## 2016-04-27 DIAGNOSIS — J15212 Pneumonia due to Methicillin resistant Staphylococcus aureus: Secondary | ICD-10-CM

## 2016-04-27 LAB — GLUCOSE, CAPILLARY
GLUCOSE-CAPILLARY: 116 mg/dL — AB (ref 65–99)
GLUCOSE-CAPILLARY: 89 mg/dL (ref 65–99)
Glucose-Capillary: 108 mg/dL — ABNORMAL HIGH (ref 65–99)

## 2016-04-27 LAB — CBC
HCT: 25 % — ABNORMAL LOW (ref 36.0–46.0)
Hemoglobin: 8.5 g/dL — ABNORMAL LOW (ref 12.0–15.0)
MCH: 29.4 pg (ref 26.0–34.0)
MCHC: 34 g/dL (ref 30.0–36.0)
MCV: 86.5 fL (ref 78.0–100.0)
PLATELETS: 154 10*3/uL (ref 150–400)
RBC: 2.89 MIL/uL — ABNORMAL LOW (ref 3.87–5.11)
RDW: 16.6 % — AB (ref 11.5–15.5)
WBC: 33.2 10*3/uL — AB (ref 4.0–10.5)

## 2016-04-27 LAB — COMPREHENSIVE METABOLIC PANEL
ALK PHOS: 227 U/L — AB (ref 38–126)
ALT: 96 U/L — AB (ref 14–54)
AST: 134 U/L — AB (ref 15–41)
Albumin: 1.2 g/dL — ABNORMAL LOW (ref 3.5–5.0)
Anion gap: 10 (ref 5–15)
BILIRUBIN TOTAL: 9.4 mg/dL — AB (ref 0.3–1.2)
BUN: 120 mg/dL — AB (ref 6–20)
CHLORIDE: 100 mmol/L — AB (ref 101–111)
CO2: 20 mmol/L — ABNORMAL LOW (ref 22–32)
CREATININE: 1.92 mg/dL — AB (ref 0.44–1.00)
Calcium: 8.2 mg/dL — ABNORMAL LOW (ref 8.9–10.3)
GFR calc Af Amer: 28 mL/min — ABNORMAL LOW (ref >60)
GFR, EST NON AFRICAN AMERICAN: 24 mL/min — AB (ref >60)
Glucose, Bld: 124 mg/dL — ABNORMAL HIGH (ref 65–99)
Potassium: 3 mmol/L — ABNORMAL LOW (ref 3.5–5.1)
Sodium: 130 mmol/L — ABNORMAL LOW (ref 135–145)
TOTAL PROTEIN: 4.1 g/dL — AB (ref 6.5–8.1)

## 2016-04-27 LAB — FIBRINOGEN: Fibrinogen: 693 mg/dL — ABNORMAL HIGH (ref 210–475)

## 2016-04-27 LAB — PROCALCITONIN: PROCALCITONIN: 0.88 ng/mL

## 2016-04-27 LAB — APTT: aPTT: 35 seconds (ref 24–36)

## 2016-04-27 LAB — PHOSPHORUS: Phosphorus: 5.9 mg/dL — ABNORMAL HIGH (ref 2.5–4.6)

## 2016-04-27 LAB — PROTIME-INR
INR: 1.23
Prothrombin Time: 15.6 seconds — ABNORMAL HIGH (ref 11.4–15.2)

## 2016-04-27 LAB — MAGNESIUM: Magnesium: 1.9 mg/dL (ref 1.7–2.4)

## 2016-04-27 MED ORDER — LEVOTHYROXINE SODIUM 100 MCG PO TABS
100.0000 ug | ORAL_TABLET | Freq: Every day | ORAL | Status: DC
  Administered 2016-04-27 – 2016-04-29 (×2): 100 ug via ORAL
  Filled 2016-04-27 (×2): qty 1

## 2016-04-27 MED ORDER — FAT EMULSION 20 % IV EMUL
240.0000 mL | INTRAVENOUS | Status: AC
  Administered 2016-04-27: 240 mL via INTRAVENOUS
  Filled 2016-04-27: qty 250

## 2016-04-27 MED ORDER — POTASSIUM CHLORIDE 10 MEQ/50ML IV SOLN
10.0000 meq | INTRAVENOUS | Status: AC
  Administered 2016-04-27 (×4): 10 meq via INTRAVENOUS
  Filled 2016-04-27 (×2): qty 50

## 2016-04-27 MED ORDER — FAMOTIDINE 40 MG/5ML PO SUSR
20.0000 mg | Freq: Every day | ORAL | Status: DC
  Administered 2016-04-27: 20 mg via ORAL
  Filled 2016-04-27: qty 2.5

## 2016-04-27 MED ORDER — M.V.I. ADULT IV INJ
INTRAVENOUS | Status: AC
  Administered 2016-04-27: 17:00:00 via INTRAVENOUS
  Filled 2016-04-27: qty 840

## 2016-04-27 NOTE — Progress Notes (Signed)
6 Days Post-Op  Subjective: She will wake up and follow commands, they tried to wean earlier and there were issues with the trach.   She has anasarca and is being diuresised.   Wound vac in place but some Bowel sounds.    Objective: Vital signs in last 24 hours: Temp:  [96.1 F (35.6 C)-97.8 F (36.6 C)] 97.3 F (36.3 C) (08/10 0343) Pulse Rate:  [48-139] 101 (08/10 1000) Resp:  [12-27] 27 (08/10 1000) BP: (102-150)/(44-116) 110/52 (08/10 0900) SpO2:  [85 %-100 %] 100 % (08/10 1000) FiO2 (%):  [40 %] 40 % (08/10 0800) Weight:  [67.1 kg (147 lb 14.9 oz)] 67.1 kg (147 lb 14.9 oz) (08/10 0500) Last BM Date:  (none since surgery) IV 1900 NG 400 intake Urine, 3250 NG/emesis - none Drain 450 Afebrile, VSS  On Vent  Na 130 K+ 3.0 Creatinine is 1.92 WBC 33.2  Intake/Output from previous day: 08/09 0701 - 08/10 0700 In: 2386.9 [I.V.:1936.3; NG/GT:400.7; IV Piggyback:50] Out: 3700 [Urine:3250; Drains:450] Intake/Output this shift: Total I/O In: 10 [I.V.:10] Out: 250 [Urine:250]  General appearance: alert, cooperative and on Vent, NG for Tube feeds,  Resp: mostly clear some wheezing, anterior exam GI: Wound vac in place some bowel sounds. Anasarca  Lab Results:   Recent Labs  04/26/16 0410 04/27/16 0400  WBC 31.7* 33.2*  HGB 8.1* 8.5*  HCT 24.0* 25.0*  PLT 139* 154    BMET  Recent Labs  04/26/16 0410 04/27/16 0400  NA 129* 130*  K 3.5 3.0*  CL 102 100*  CO2 19* 20*  GLUCOSE 106* 124*  BUN 111* 120*  CREATININE 1.87* 1.92*  CALCIUM 8.1* 8.2*   PT/INR  Recent Labs  04/26/16 0410 04/27/16 0505  LABPROT 15.9* 15.6*  INR 1.26 1.23     Recent Labs Lab 04/29/2016 0500 04/22/16 0808 04/24/16 0406 04/27/16 0400  AST 109* 78* 165* 134*  ALT 67* 51 90* 96*  ALKPHOS 129* 128* 170* 227*  BILITOT 3.7* 2.9* 5.1* 9.4*  PROT 3.5* <3.0* 3.8* 4.1*  ALBUMIN 1.3* 1.0* 1.2* 1.2*     Lipase     Component Value Date/Time   LIPASE 26 04/13/2016 0330      Studies/Results: Dg Chest Port 1 View  Result Date: 04/26/2016 CLINICAL DATA:  Respiratory failure. EXAM: PORTABLE CHEST 1 VIEW COMPARISON:  04/29/2016. FINDINGS: Interim removal right IJ line. Tracheostomy tube, NG tube in stable position. Left PICC line stable position. Heart size stable. Postsurgical changes right lung Left lower lobe infiltrate with small left pleural effusion. Mild right base atelectasis with interim improvement of small right pleural effusion. No pneumothorax. IMPRESSION: 1. Interim removal of right IJ line. Remaining lines and tubes in stable position. 2. Postsurgical changes right lung . Left lower lobe infiltrate and small left pleural effusion with slight progression from prior exam. Mild right base subsegmental atelectasis and small right pleural effusion. Electronically Signed   By: Marcello Moores  Register   On: 04/26/2016 07:22    Prior to Admission medications   Medication Sig Start Date End Date Taking? Authorizing Provider  albuterol (PROVENTIL HFA;VENTOLIN HFA) 108 (90 Base) MCG/ACT inhaler Inhale 2 puffs into the lungs every 6 (six) hours as needed for wheezing or shortness of breath.   Yes Historical Provider, MD  albuterol (PROVENTIL) (2.5 MG/3ML) 0.083% nebulizer solution Take 2.5 mg by nebulization 2 (two) times daily.   Yes Historical Provider, MD  aspirin EC 81 MG tablet Take 81 mg by mouth daily.   Yes Historical  Provider, MD  cholecalciferol (VITAMIN D) 1000 units tablet Take 1,000 Units by mouth daily after breakfast.   Yes Historical Provider, MD  furosemide (LASIX) 20 MG tablet Take 20 mg by mouth daily as needed (swelling).  01/17/16  Yes Historical Provider, MD  levothyroxine (SYNTHROID, LEVOTHROID) 50 MCG tablet Take 50 mcg by mouth daily after breakfast. 02/10/16  Yes Historical Provider, MD  metoprolol succinate (TOPROL-XL) 25 MG 24 hr tablet Take 25 mg by mouth daily. 03/31/16  Yes Historical Provider, MD  OXYGEN Inhale 2 L into the lungs at bedtime.   Yes  Historical Provider, MD  Probiotic Product (PROBIOTIC PO) Take 1 tablet by mouth daily after breakfast.   Yes Historical Provider, MD  ranitidine (ZANTAC) 150 MG tablet Take 150 mg by mouth daily after breakfast.   Yes Historical Provider, MD     Medications: . antiseptic oral rinse  7 mL Mouth Rinse QID  . budesonide (PULMICORT) nebulizer solution  0.5 mg Nebulization BID  . cefTRIAXone (ROCEPHIN)  IV  2 g Intravenous Q24H  . chlorhexidine gluconate (SAGE KIT)  15 mL Mouth Rinse BID  . feeding supplement (VITAL HIGH PROTEIN)  1,000 mL Per Tube Q24H  . insulin aspart  0-9 Units Subcutaneous Q4H  . ipratropium-albuterol  3 mL Nebulization Q6H  . levothyroxine  100 mcg Oral QAC breakfast  . potassium chloride  10 mEq Intravenous Q1 Hr x 4  . sodium chloride flush  10-40 mL Intracatheter Q12H   . sodium chloride 10 mL/hr at 04/26/16 2300  . phenylephrine (NEO-SYNEPHRINE) Adult infusion Stopped (04/22/16 1238)  . Marland KitchenTPN (CLINIMIX-E) Adult 75 mL/hr at 04/26/16 2300   TRACHEAL ASPIRATE  04/14/2016   Special Requests NONE  Gram Stain ABUNDANT WBC PRESENT, PREDOMINANTLY PMN  FEW SQUAMOUS EPITHELIAL CELLS PRESENT  ABUNDANT GRAM POSITIVE COCCI IN PAIRS IN CLUSTERS  RARE BUDDING YEAST SEEN      Culture ABUNDANT METHICILLIN RESISTANT STAPHYLOCOCCUS AUREUS  Report Status 04/12/2016 FINAL  Organism ID, Bacteria METHICILLIN RESISTANT STAPHYLOCOCCUS AUREUS    Assessment/Plan S/p colostomy takedown at [facility] S/p multiple explorations, SB resections, SMA bypass S/p SB anastomosis x2 on 8/1 Exploratory laparotomy, small bowel resection x 2, placement of abdominal wound vac, 04/04/2016 Dr. Stark Klein POD 11 SMALL BOWEL RESECTION, ANASTOMSIS AND RE-APPLICATION OF WOUND VAC, 04/19/2016 Dr. Judeth Horn POD 9, CLOSURE OF ABDOMEN 05/02/2016 Dr. Judeth Horn Post op CVA at [facility] Acute encephalopathy Acute renal failure Anasarca - diuresing  Hypokalemia - being replaced VDRF Shock FEN:  TNA/Tube  feeds - trickle  ID: Day 18 antibiotics, day 10 Ceftriaxone DVT:  SCD ordered/anemia  Plan:  Wound vac being changed MWF, Vent per CCM, they are also doing trickle feeds at 20 ml/hr now.  TNA is ongoing.  K+ being replaced.      LOS: 18 days    Amy [name] 04/27/2016 [phone]

## 2016-04-27 NOTE — Progress Notes (Signed)
SLP Cancellation Note  Patient Details Name: Amy [name] MRN: [id number] DOB: 05-Sep-1941   Cancelled treatment:       Reason Eval/Treat Not Completed: Medical issues which prohibited therapy. Pt remains on vent. Will continue to follow.   [name] 04/27/2016, 3:24 PM  [name], M.A. CCC-SLP [phone]

## 2016-04-27 NOTE — Progress Notes (Signed)
eLink Physician-Brief Progress Note Patient Name: Bellamarie Pflug DOB: [birth year] MRN: [id number]   Date of Service  04/27/2016  HPI/Events of Note  K=3.0  eICU Interventions  Replaced 40 KCl meq     Intervention Category Intermediate Interventions: Electrolyte abnormality - evaluation and management  Noele Icenhour 04/27/2016, 6:00 AM

## 2016-04-27 NOTE — Progress Notes (Signed)
PULMONARY / CRITICAL CARE MEDICINE   Name: Amy [name] MRN: [id number] DOB: [birth year]    ADMISSION DATE:  03/21/2016 CONSULTATION DATE:  7/23  REFERRING MD:  [facility] hospital per family request   CHIEF COMPLAINT:  Acute encephalopathy and progressive respiratory failure   HISTORY OF PRESENT ILLNESS:   This is a 75 year old white female who was initially admitted to [facility] hospital where she underwent an elective loop colostomy takedown on 7/18. Her immediate post-op course was c/b lethargy and actually required narcan in PACU. She never really improved w/ hospital notes continuing to raise concern for altered mental status and difficulty to arouse. She ultimately required emergent intubation for hypercarbia (CO2 52) and inability to protect airway. She was transferred per family request on 7/23 for further evaluation and care. MRI confirmed new CVA. Suspect this was multifactorial: CVA, meds, +/- pneumonia. She was extubated on 7/24 and transferred to the SDU. On 7/28, she developed worsening abdominal pain and leukocytosis. 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 On 7/29, she underwent aorto to superior mesenteric artery bypass with greater saphenous vein graft and small bowel resection. In the immediate post-op period, she became hypotensive to the 50s and was started on pressors. She was transferred back to [facility] service. After multiple surgeries her abdomen was closed on 8/4 as her gut appeared healthy at that point, tracheostomy placed on 8/4.  SUBJECTIVE:   Remains critically ill, on vent, with TNA Afebrile Good UO   VITAL SIGNS: BP 114/78   Pulse 61   Temp 97.3 F (36.3 C) (Axillary)   Resp (!) 21   Ht '5\' 6"'$  (1.676 m)   Wt 147 lb 14.9 oz (67.1 kg)   SpO2 100%   BMI 23.88 kg/m   HEMODYNAMICS:    VENTILATOR SETTINGS: Vent Mode: CPAP;PSV FiO2 (%):  [40 %] 40 % Set Rate:  [14 bmp] 14 bmp Vt Set:  [480 mL] 480 mL PEEP:  [5  cmH20] 5 cmH20 Pressure Support:  [5 cmH20] 5 cmH20 Plateau Pressure:  [12 cmH20-15 cmH20] 15 cmH20  INTAKE / OUTPUT: I/O last 3 completed shifts: In: 3526.9 [I.V.:2986.3; NG/GT:490.7; IV Piggyback:50] Out: 5465 [Urine:4140; Drains:600]  PHYSICAL EXAMINATION: General: on vent, no distress, no follows commands HEENT: tracheostomy clean, dry, icteric PULM: CTA B, vent supported breaths, decreased bs in bases CV: RRR, no mgr GI: midline scar with wound vac,wound vac  Derm: massive anasarca, some scattered bruising, yellow Neuro: nods to questions, grossly non focal LABS:  BMET  Recent Labs Lab 04/25/16 0410 04/26/16 0410 04/27/16 0400  NA 129* 129* 130*  K 3.3* 3.5 3.0*  CL 102 102 100*  CO2 19* 19* 20*  BUN 110* 111* 120*  CREATININE 1.99* 1.87* 1.92*  GLUCOSE 117* 106* 124*    Electrolytes  Recent Labs Lab 04/25/16 0410 04/26/16 0410 04/27/16 0400  CALCIUM 7.8* 8.1* 8.2*  MG 1.9 1.8 1.9  PHOS 3.8 4.6 5.9*    CBC  Recent Labs Lab 04/25/16 0410 04/26/16 0410 04/27/16 0400  WBC 31.8* 31.7* 33.2*  HGB 8.1* 8.1* 8.5*  HCT 24.4* 24.0* 25.0*  PLT 131* 139* 154    Coag's  Recent Labs Lab 04/25/16 0410 04/26/16 0410 04/27/16 0505  APTT 32 33 35  INR 1.26 1.26 1.23    Sepsis Markers No results for input(s): LATICACIDVEN, PROCALCITON, O2SATVEN in the last 168 hours.  ABG  Recent Labs Lab 04/22/2016 1436 04/29/2016 1535  PHART 7.383 7.302*  PCO2ART 42.4 45.6*  PO2ART 505.0* 129.0*    Liver Enzymes  Recent Labs Lab 04/22/16 0808 04/24/16 0406 04/27/16 0400  AST 78* 165* 134*  ALT 51 90* 96*  ALKPHOS 128* 170* 227*  BILITOT 2.9* 5.1* 9.4*  ALBUMIN 1.0* 1.2* 1.2*    Cardiac Enzymes No results for input(s): TROPONINI, PROBNP in the last 168 hours.  Glucose  Recent Labs Lab 04/26/16 1155 04/26/16 1605 04/26/16 2026 04/26/16 2332 04/27/16 0333 04/27/16 0804  GLUCAP 118* 101* 98 116* 89 108*    Imaging No results  found.   STUDIES:  CT head (at [facility]): negative  MRI brain  7/23: Bilateral cerebral hemispheres and tight cerebellar hemisphere patchy acute and early subacute infarcts. No mass effect or hemorrhage. Central thromboembolic source is suspected.  EEG 7/23: Abnormal due to moderate diffuse slowing of the waking background. No seizure or epiletiform discharges.  TTE 7/24: EF 50-55%, lateral wall appears hypokinetic, systolic function normal, mild MR regurg Carotid US 7/24: right mild soft plaque CCA, mild to moderate plaque origin and proximal ICA and ECA 1-39% ICA stenosis.  EEG 8/2>>>enceph, no focus  MICROBIOLOGY: MRSA PCR 7/23:  Positive  Urine Ctx 7/23:  Negative  Blood Ctx x2 7/23:  Negative  Tracheal Asp Ctx 7/23:  MRSA Blood Ctx x2 7/28>>> NGTD Urine Ctx 7/28:  Negative  ANTIBIOTICS: Vancomycin 7/23 >>>10 days  Meropenem 7/23 >> 8/1 Diflucan 7/27 >>>8/2 Ceftriaxone 8/1 >>>  SIGNIFICANT EVENTS: Loop colostomy takedown 7/18 7/18-7/22: progressively lethargic  7/23 transferred to [facility] s/p getting intubated for AMS and hypercarbia. Surgical service consulted at [facility] on arrival  7/24 on CCB gtt for af. But fully awake and following commands. Extubated. 7/25 SLP evaluation for diet, ASA started, FEES 7/28 developed worsening abdominal pain, leukocytosis, CT showing complete SMA occlusion 7/29 Underwent aorto to SMA bypass with greater saphenous vein graft and small bowel resection 7/29 Developed hypotension to the 50s in post-op period, started on pressors 7/29 Code status changed to DNR 7/30 Ex Lap by CCS w/ necrotic bowel & ischemia s/p resection w/ intact bypass & oozing over peritoneal surfaces. 8/1 OR, limited small bowel resection, anastamosis x2, vascular graft intact 8/3 neo needed again 8/4 - belly closed, Tracheostomy placed, low UOP  LINES/TUBES: OETT 7/23>>>7/24; 7/29 >>8/4 R IJ CVL 7/19 >> 8/5 L Radial Art Line 7/29 >> 8/1 patient pulled R Radial Art Line  8/1 >>>plan to dc post op 8 /4 FOLEY 7/29 >> R NGT 7/29 >> IABP 7/29 >> out (date?) Tracheostomy 8/4 >>  ASSESSMENT / PLAN:  PULMONARY A: Acute Hypercarbic Respiratory Failure Acute Hypoxic Respiratory Failure MRSA Pneumonia H/O COPD H/O OSA (intolerant of CPAP) H/O RUL Lobectomy - For NSCLC P:   Attempt  trach collar again, last 8/7 - secretions limiting VAP prevention Trach care  CARDIOVASCULAR A:  Shock -SIRS / sedation > resolving Atrial Fibrillation Elevated Troponin I - Likely demand ischemia H/O HTN  H/O Bilateral ICA Stenosis P:  Off hydrocortisone Tele   GASTROINTESTINAL A:   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 H/O GERD  P:   TPN per surgery, trickle feeds ordered  8/9, once up to goal -can dc TNA Pepcid IV q12hr Nutrition per surgery Wound vac in place, pain control  RENAL Lab Results  Component Value Date   CREATININE 1.92 (H) 04/27/2016   CREATININE 1.87 (H) 04/26/2016   CREATININE 1.99 (H) 04/25/2016  A:   ARF, ATN  Anasarca Hypokalemia  P:   Lasix on hold Continue foley Monitor UOP Replace K+  HEMATOLOGIC A:   Leukocytosis > steroid related?Remains elevated 8/9 31.7, afebrile Anemia improving Thrombocytopenia improving  P:  Monitor for bleeding Transfuse for Hgb < 10 Follow wbc  INFECTIOUS A:   WBC up 8/5, better 8/6 source? Steroids? 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   P:   Goal RASS: 0 Fentanyl IV prn  only   FAMILY UPDATE: Husband updated 8/9  cctime x 19m RKara MeadMD. FCCP. [facility] Pager [phone] no response call [phone]     04/27/2016, 8:24 AM

## 2016-04-27 NOTE — Progress Notes (Signed)
PARENTERAL NUTRITION CONSULT NOTE - FOLLOW UP  Pharmacy Consult:  TPN Indication:  Massive bowel resection  Patient Measurements: Height: '5\' 6"'$  (167.6 cm) Weight: 147 lb 14.9 oz (67.1 kg) IBW/kg (Calculated) : 59.3 Adjusted Body Weight: 63.9 kg  Vital Signs: Temp: 97.3 F (36.3 C) (08/10 0343) Temp Source: Axillary (08/10 0343) BP: 114/78 (08/10 0700) Pulse Rate: 61 (08/10 0300) Intake/Output from previous day: 08/09 0701 - 08/10 0700 In: 2386.9 [I.V.:1936.3; NG/GT:400.7; IV Piggyback:50] Out: 1884 [Urine:3250; Drains:450] Intake/Output from this shift: No intake/output data recorded.   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  She returned to the OR on 03/25/2016 for removal of 2 more short segments of small bowel.  Per CCS, overall most of small bowel is viable.  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   GI: GERD with Pepcid in TPN -  prealbumin up 5.7>7.4, LBM 7/27, no NG O/P, drains O/P 456m.  RLE serous drainage noted - likely d/t anasarca.  Endo: hypothyroid on IV Synthroid, TSH 28 > 13 post dose increase.  No hx DM - CBGs controlled Lytes: low Na/CL/CO2, K+ low at 3 (4 runs ordered), CoCa WNL at 10.44, Phos elevated at 5.9 (Ca x Phos = 61, goal < 55), others WNL *days without lytes in TPN: 8/4 > 8/7, 8/10 Renal: SCr up 1.92, BUN up 120 - good UOP 2 ml/kg/hr (Lasix), net +16L since admit Pulm: COPD / OSA / NSCLC post RUL lobectomy. Trach placed 8/4 - remains on vent, FiO2 40% - Pulmicort, Duonebs.  Difficult wean Cards: HTN (EF 50-55%) - VSS, Lasix IV BID Hepatobil: LFTs trended up, tbili elevated at 9.4 (significant jaundice on exam). TG/ lipase/amylase WNL AC: Eliquis for Afib and new CVA on hold - hgb unchanged at 8.1, plts improved to 139 Neuro: new CVA on 7/23, encephalopathy on 8/2 EEG - GCS 13, CPOT 0,  RASS 0 ID: s/p 10d vanc for MRSA PNA >> CTX D#10, repeat cx negative.  Afebrile, WBC up 33.2.  No coverage for anaerobes if concerned for intra-abd source.  Also, fungal risk with TPN.  Best Practices: SCDs, Pepcid, MC TPN Access: PICC triple lumen  TPN start date: 03/27/2016  Current Nutrition: TPN + TF  Nutritional Goals: 1312 kCal and 105-115 g of protein   Plan:  - Reduce Clinimix 5/15 (remove electrolytes) to 35 ml/hr and increase lipids to 10 ml/hr on TTS to minimize kCal provision.  TPN will provide a weekly average of 801 kCal and 42gm of protein per day. - Vital HP at 20 ml/hr per MD = 480 kCal and 42gm of protein per day - TPN and TF to provide 1282 kCal and 84 gm protein per day, meeting 98% of kCal and 80% of protein needs.  TPN providing 1.3g/kg/day of protein.  Watch BUN. - Daily multivitamin in TPN.  Remove trace elements from TPN given elevated tbili and jaundice. - Change Pepcid '20mg'$  daily to PT - D/C SSI/CBG checks - F/U TF tolerance/advancement to wean TPN off   Amy [name] D. DMina Marble PharmD, BCPS Pager:  [phone]/06/2016, 11:34 AM

## 2016-04-27 NOTE — Progress Notes (Signed)
Pt seen by trach team for follow up. No education given at this time. All needed equipment at bedside. Will continue to follow.

## 2016-04-28 ENCOUNTER — Inpatient Hospital Stay (HOSPITAL_COMMUNITY): Payer: Medicare Other

## 2016-04-28 LAB — APTT: APTT: 34 s (ref 24–36)

## 2016-04-28 LAB — HEPATIC FUNCTION PANEL
ALT: 93 U/L — ABNORMAL HIGH (ref 14–54)
AST: 116 U/L — ABNORMAL HIGH (ref 15–41)
Albumin: 1.1 g/dL — ABNORMAL LOW (ref 3.5–5.0)
Alkaline Phosphatase: 229 U/L — ABNORMAL HIGH (ref 38–126)
Bilirubin, Direct: 6.6 mg/dL — ABNORMAL HIGH (ref 0.1–0.5)
Indirect Bilirubin: 2.9 mg/dL — ABNORMAL HIGH (ref 0.3–0.9)
Total Bilirubin: 9.5 mg/dL — ABNORMAL HIGH (ref 0.3–1.2)
Total Protein: 4 g/dL — ABNORMAL LOW (ref 6.5–8.1)

## 2016-04-28 LAB — CBC
HCT: 23.3 % — ABNORMAL LOW (ref 36.0–46.0)
HEMOGLOBIN: 8.2 g/dL — AB (ref 12.0–15.0)
MCH: 30.3 pg (ref 26.0–34.0)
MCHC: 35.2 g/dL (ref 30.0–36.0)
MCV: 86 fL (ref 78.0–100.0)
Platelets: 186 10*3/uL (ref 150–400)
RBC: 2.71 MIL/uL — ABNORMAL LOW (ref 3.87–5.11)
RDW: 17.4 % — AB (ref 11.5–15.5)
WBC: 45.8 10*3/uL — ABNORMAL HIGH (ref 4.0–10.5)

## 2016-04-28 LAB — BASIC METABOLIC PANEL
ANION GAP: 11 (ref 5–15)
BUN: 129 mg/dL — AB (ref 6–20)
CALCIUM: 8.3 mg/dL — AB (ref 8.9–10.3)
CO2: 18 mmol/L — AB (ref 22–32)
Chloride: 101 mmol/L (ref 101–111)
Creatinine, Ser: 2.09 mg/dL — ABNORMAL HIGH (ref 0.44–1.00)
GFR calc Af Amer: 25 mL/min — ABNORMAL LOW (ref >60)
GFR calc non Af Amer: 22 mL/min — ABNORMAL LOW (ref >60)
GLUCOSE: 121 mg/dL — AB (ref 65–99)
Potassium: 3.2 mmol/L — ABNORMAL LOW (ref 3.5–5.1)
Sodium: 130 mmol/L — ABNORMAL LOW (ref 135–145)

## 2016-04-28 LAB — C DIFFICILE QUICK SCREEN W PCR REFLEX
C DIFFICILE (CDIFF) INTERP: NOT DETECTED
C DIFFICILE (CDIFF) TOXIN: NEGATIVE
C DIFFICLE (CDIFF) ANTIGEN: NEGATIVE

## 2016-04-28 LAB — PROTIME-INR
INR: 1.19
PROTHROMBIN TIME: 15.2 s (ref 11.4–15.2)

## 2016-04-28 LAB — PROCALCITONIN: PROCALCITONIN: 1.35 ng/mL

## 2016-04-28 LAB — MAGNESIUM: MAGNESIUM: 1.9 mg/dL (ref 1.7–2.4)

## 2016-04-28 LAB — PHOSPHORUS: PHOSPHORUS: 5.8 mg/dL — AB (ref 2.5–4.6)

## 2016-04-28 MED ORDER — M.V.I. ADULT IV INJ
INTRAVENOUS | Status: DC
  Filled 2016-04-28: qty 1560

## 2016-04-28 MED ORDER — FAMOTIDINE 200 MG/20ML IV SOLN
INTRAVENOUS | Status: DC
Start: 2016-04-28 — End: 2016-04-29
  Administered 2016-04-28: 17:00:00 via INTRAVENOUS
  Filled 2016-04-28: qty 1560

## 2016-04-28 MED ORDER — SODIUM CHLORIDE 0.9 % IV SOLN
100.0000 mg | INTRAVENOUS | Status: DC
  Administered 2016-04-28 – 2016-04-29 (×2): 100 mg via INTRAVENOUS
  Filled 2016-04-28 (×3): qty 100

## 2016-04-28 MED ORDER — POTASSIUM CHLORIDE 10 MEQ/50ML IV SOLN
10.0000 meq | INTRAVENOUS | Status: AC
  Administered 2016-04-28 (×4): 10 meq via INTRAVENOUS
  Filled 2016-04-28: qty 50

## 2016-04-28 MED ORDER — POTASSIUM CHLORIDE 10 MEQ/50ML IV SOLN
INTRAVENOUS | Status: AC
  Filled 2016-04-28: qty 50

## 2016-04-28 NOTE — Progress Notes (Signed)
Sutures removed per order

## 2016-04-28 NOTE — Consult Note (Signed)
WOC follow-up: Called r/t right thigh Vac leaking.  Pt is weeping large amt yellow drainage from between legs and also to thigh and this is impairing Vac dressing seal.  Changed dressing and applied 2 pieces over suture line and open wound to 197m cont suction.  If leakage continues to impair Vac seal, then bedside nurses can remove and apply moist gauze and ABD pads. DJulien GirtMSN, RN, [place]

## 2016-04-28 NOTE — Evaluation (Signed)
Physical Therapy Evaluation Patient Details Name: Amy [name] MRN: [id number] DOB: 1941/02/25 Today's Date: 04/28/2016   History of Present Illness  Pt transferred from [facility] after reversal of colostomy. Post-op with lethargy and new onset afib. 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 Pt intubated until 7/24. Hospital course complicated by shock with SB resection and VDRF on 7/29.  Pt also had mesenteric bypass with wound VAC applied.  Underwent another SB resection and wound VAC replaced on 8/1.  On 8/4, pt for abdominal closure but still unable to fully close with VAC on abdomen and pt was trached that day.   Pt also with right thigh wound with VAC in place. PMH - lung CA, COPD, HTN  Clinical Impression  Pt admitted with above diagnosis. Pt currently with functional limitations due to the deficits listed below (see PT Problem List).  Pt was able to sit in full chair position for 4 min needing max assist for sitting balance.  Very weak and needs to continue PT. Goals revised as pt has had multiple complications and PT was discontinued 7/28 until new evaluation today 8/11.  Hope that pt can go to CIR once medically appropriate.  Pt will benefit from skilled PT to increase their independence and safety with mobility to allow discharge to the venue listed below.      Follow Up Recommendations CIR    Equipment Recommendations  Other (comment) (to be assessed)    Recommendations for Other Services Rehab consult     Precautions / Restrictions Precautions Precautions: Fall Restrictions Weight Bearing Restrictions: No      Mobility  Bed Mobility Overal bed mobility: Needs Assistance Bed Mobility: Rolling;Supine to Sit Rolling: Total assist;+2 for physical assistance   Supine to sit: Total assist;+2 for physical assistance     General bed mobility comments: Bed progressively inclined to full chair position.  Pt was able to sit without back support on bed  with total assist effort by therapist for 4 minutes and then 2nd attempt 1 minute.  Pt was trying to tell PT and tech something. Tried to get her to write on dry erase board.  PT finally determined that pt needed to use bathroom.  Rolled pt once bed reclined and pt had very runny stool.  Pt rolled and cleaned with total assist.  Nursing notified of severely runny stool that appeared to have blood in it.    Transfers                    Ambulation/Gait                Stairs            Wheelchair Mobility    Modified Rankin (Stroke Patients Only)       Balance Overall balance assessment: Needs assistance Sitting-balance support: Bilateral upper extremity supported;Feet supported Sitting balance-Leahy Scale: Poor Sitting balance - Comments: At times can sit EOB with min  assist then fluctuates to needing max assist with strong posterior lean. Postural control: Posterior lean                                   Pertinent Vitals/Pain Pain Assessment: Faces Faces Pain Scale: Hurts even more Pain Location: abdomen Pain Descriptors / Indicators: Grimacing;Guarding;Operative site guarding Pain Intervention(s): Limited activity within patient's tolerance;Monitored during session;Repositioned  Pt on 60% trach collar with sats >92% most of session.  Desat to 87% once when sitting up.  Recovered quickly and was 100% on departure from room. Other VSS.   Home Living Family/patient expects to be discharged to:: Inpatient rehab Living Arrangements: Spouse/significant other Available Help at Discharge: Family;Available 24 hours/day Type of Home: House Home Access: Stairs to enter Entrance Stairs-Rails: Right Entrance Stairs-Number of Steps: 1 Home Layout: Two level;Able to live on main level with bedroom/bathroom Home Equipment: Walker - 2 wheels      Prior Function Level of Independence: Independent         Comments: Pt required ST-SNF after  hospitalization several months ago but had regained independence and was able to do stairs as well.     Hand Dominance   Dominant Hand: Right    Extremity/Trunk Assessment   Upper Extremity Assessment: Defer to OT evaluation           Lower Extremity Assessment: RLE deficits/detail;LLE deficits/detail RLE Deficits / Details: grossly 3-/5 LLE Deficits / Details: grossly 3-/5  Cervical / Trunk Assessment: Kyphotic  Communication   Communication: No difficulties  Cognition Arousal/Alertness: Awake/alert Behavior During Therapy: Flat affect Overall Cognitive Status: Impaired/Different from baseline Area of Impairment: Attention;Memory;Following commands;Safety/judgement;Awareness;Problem solving Orientation Level: Disoriented to;Time Current Attention Level: Sustained Memory: Decreased short-term memory Following Commands: Follows one step commands consistently Safety/Judgement: Decreased awareness of safety;Decreased awareness of deficits Awareness: Emergent Problem Solving: Slow processing;Decreased initiation;Difficulty sequencing;Requires verbal cues      General Comments      Exercises General Exercises - Lower Extremity Ankle Circles/Pumps: AROM;Both;5 reps;Supine Long Arc Quad: AROM;Both;10 reps;Seated Heel Slides: AROM;Both;5 reps;Supine Other Exercises Other Exercises: seated trunk control activities, reaching forward flexion with 10-15 second hold      Assessment/Plan    PT Assessment Patient needs continued PT services  PT Diagnosis Difficulty walking;Generalized weakness;Altered mental status   PT Problem List Decreased strength;Decreased activity tolerance;Decreased balance;Decreased mobility;Decreased cognition;Decreased knowledge of use of DME;Decreased knowledge of precautions  PT Treatment Interventions DME instruction;Gait training;Functional mobility training;Therapeutic activities;Therapeutic exercise;Balance training;Neuromuscular  re-education;Cognitive remediation;Patient/family education   PT Goals (Current goals can be found in the Care Plan section) Acute Rehab PT Goals Patient Stated Goal: none stated PT Goal Formulation: Patient unable to participate in goal setting Time For Goal Achievement: 05/12/16 Potential to Achieve Goals: Fair    Frequency Min 3X/week   Barriers to discharge        Co-evaluation               End of Session Equipment Utilized During Treatment: Gait belt;Oxygen (60% trach collar) Activity Tolerance: Patient limited by fatigue Patient left: in bed;with call bell/phone within reach Nurse Communication: Mobility status;Need for lift equipment         Time: 2831-5176 PT Time Calculation (min) (ACUTE ONLY): 34 min   Charges:   PT Evaluation $PT Eval Moderate Complexity: 1 Procedure PT Treatments $Therapeutic Activity: 8-22 mins   PT G CodesDenice Paradise 05/17/16, 3:49 PM  Kenson Groh,PT Acute Rehabilitation [phone] (pager)

## 2016-04-28 NOTE — Consult Note (Signed)
[facility] Nurse wound follow-up consult note Reason for Consult: Right thigh wound Vac dressing change.  Applied Vac to contain drainage to right thigh site; unchanged in appearance since previous assessment.Applied one piece black foam to the wound, then drape to protect over the staples which are located next to the wound, then another piece of black foam to 1110m over the incisional staples area which is weeping mod amt yellow drainage.   Pt also has a Vac dressing to full thickness post-op abd wounds; wound appearance unchanged since previous assessment. Mod amt pink drainage in the cannister. Applied 1 pieceblack foam and bridged to another full thickness wound on the left abd; 2 pieces black foam applied. Both Vac dressings Yed together to one machine. WOC will plan to change dressings Q M/W/F. Pt was medicated prior to procedure and tolerated with mod amt pain. DJulien GirtMSN, RN, [place]

## 2016-04-28 NOTE — Progress Notes (Signed)
SLP Cancellation Note  Patient Details Name: Steph Cheadle MRN: [id number] DOB: [birth year]   Cancelled treatment:       Reason Eval/Treat Not Completed: Medical issues which prohibited therapy.  Pt remains on vent. Will continue to follow.   Juan Quam Laurice 04/28/2016, 10:47 AM

## 2016-04-28 NOTE — Progress Notes (Signed)
PT Cancellation Note  Patient Details Name: Amy [name] MRN: [id number] DOB: 09-30-1940   Cancelled Treatment:    Reason Eval/Treat Not Completed: Medical issues which prohibited therapy (Pt lethargic. Nurse asked PT to return in pm. Will as able. )   Irwin Brakeman F 04/28/2016, 11:34 AM Amanda [facility] Acute Rehabilitation [phone] (pager)

## 2016-04-28 NOTE — Progress Notes (Signed)
PARENTERAL NUTRITION CONSULT NOTE - FOLLOW UP  Pharmacy Consult:  TPN Indication:  Massive bowel resection  Patient Measurements: Height: '5\' 6"'$  (167.6 cm) Weight: 145 lb 4.5 oz (65.9 kg) IBW/kg (Calculated) : 59.3 Adjusted Body Weight: 63.9 kg  Vital Signs: Temp: 96.7 F (35.9 C) (08/11 0356) Temp Source: Axillary (08/11 0356) BP: 138/47 (08/11 0700) Pulse Rate: 68 (08/11 0700) Intake/Output from previous day: 08/10 0701 - 08/11 0700 In: 1962.7 [I.V.:1372.7; NG/GT:490; IV Piggyback:100] Out: 2130 [Urine:1905; Drains:225] Intake/Output from this shift: No intake/output data recorded.   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  She returned to the OR on 03/31/2016 for removal of 2 more short segments of small bowel.  Per CCS, overall most of small bowel is viable.  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   NG tube is turned to suction because patient was not tolerating tube feeding.  Tube feeding is currently off.   GI: GERD with Pepcid in TPN -  prealbumin up 5.7>7.4, LBM 7/27, drains O/P down to 26m.  RLE serous drainage noted - likely d/t anasarca.  Endo: hypothyroid on Synthroid, TSH 28 > 13 post dose increase.  No hx DM - CBGs controlled Lytes: low Na/CO2, K+ low at 3.2, CoCa high normal at 10.54, Phos elevated at 5.8 (Ca x Phos = 61, goal < 55) *days without lytes in TPN: 8/4 > 8/7, 8/10, 8/11 Renal: SCr up 2.09, BUN up 129 - good UOP 1.2 ml/kg/hr, net +16L since admit Pulm: COPD / OSA / NSCLC post RUL lobectomy. Trach placed 8/4 - remains on vent, FiO2 40% - Pulmicort, Duonebs.  Difficult wean Cards: HTN (EF 50-55%) - VSS, in Afib, PRN Lasix Hepatobil: LFTs improving, tbili elevated at 9.5 (significant jaundice on exam). TG/ lipase/amylase WNL  AC: Eliquis for Afib and new CVA on hold - hgb 8.2, plts  normalized Neuro: new CVA on 7/23, encephalopathy on 8/2 EEG - GCS 12, CPOT 0 ID: s/p 10d vanc for MRSA PNA >> CTX D#11/11, repeat cx negative.  Afebrile, WBC up 45.8, PCT up 1.35.  No coverage for anaerobes if concerned for intra-abd source.  Also, fungal risk with TPN.  Best Practices: SCDs, Pepcid, MC TPN Access: PICC triple lumen  TPN start date: 04/11/2016  Current Nutrition: TPN  Nutritional Goals: 1312 kCal and 105-115 g of protein   Plan:  - Increase Clinimix 5/15 (no electrolytes) to 65 ml/hr and continue lipids at 10 ml/hr on TTS only to minimize kCal provision.  TPN will provide a weekly average of 1313 kCal and 78 gm of protein per day, meeting 100% of patient's kCal and 74% of protein needs. - Watch BUN and adjust protein provision as appropriate.  TPN is providing 1.2 g/kg/d of protein (decreasing). - Daily multivitamin in TPN.  No trace elements in TPN given elevated tbili and jaundice. - KCL x4 runs - Add Pepcid '20mg'$  back in TPN - F/U AM labs - Consider reculturing and broadening abx/start antifungal   Amy Robben D. DMina Marble PharmD, BCPS Pager:  [phone]/07/2016, 12:33 PM

## 2016-04-28 NOTE — Progress Notes (Addendum)
PULMONARY / CRITICAL CARE MEDICINE   Name: Amy [name] MRN: [id number] DOB: 05-Jul-1941    ADMISSION DATE:  03/22/2016 CONSULTATION DATE:  7/23  REFERRING MD:  [facility] hospital per family request   CHIEF COMPLAINT:  Acute encephalopathy and progressive respiratory failure   HISTORY OF PRESENT ILLNESS:   This is a 75 year old white female who was initially admitted to [facility] hospital where she underwent an elective loop colostomy takedown on 7/18. Her immediate post-op course was c/b lethargy and actually required narcan in PACU. She never really improved w/ hospital notes continuing to raise concern for altered mental status and difficulty to arouse. She ultimately required emergent intubation for hypercarbia (CO2 52) and inability to protect airway. She was transferred per family request on 7/23 for further evaluation and care. MRI confirmed new CVA. Suspect this was multifactorial: CVA, meds, +/- pneumonia. She was extubated on 7/24 and transferred to the SDU. On 7/28, she developed worsening abdominal pain and leukocytosis. 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 On 7/29, she underwent aorto to superior mesenteric artery bypass with greater saphenous vein graft and small bowel resection. In the immediate post-op period, she became hypotensive to the 50s and was started on pressors. She was transferred back to [facility] service. After multiple surgeries her abdomen was closed on 8/4 as her gut appeared healthy at that point, tracheostomy placed on 8/4.  SUBJECTIVE:   Remains critically ill, on vent, with TNA Afebrile Good UO, equal balance More awake, alert   VITAL SIGNS: BP (!) 138/47   Pulse (!) 53   Temp (!) 96.7 F (35.9 C) (Axillary)   Resp 17   Ht '5\' 6"'$  (1.676 m)   Wt 145 lb 4.5 oz (65.9 kg)   SpO2 100%   BMI 23.45 kg/m   HEMODYNAMICS:    VENTILATOR SETTINGS: Vent Mode: CPAP;PSV FiO2 (%):  [40 %] 40 % Set Rate:  [14 bmp] 14  bmp Vt Set:  [480 mL] 480 mL PEEP:  [5 cmH20] 5 cmH20 Pressure Support:  [5 cmH20] 5 cmH20 Plateau Pressure:  [16 cmH20] 16 cmH20  INTAKE / OUTPUT: I/O last 3 completed shifts: In: 3282.7 [I.V.:2392.7; NG/GT:740; IV Piggyback:150] Out: 8242 [Urine:4030; Drains:425]  PHYSICAL EXAMINATION: General: on vent, no distress, no follows commands HEENT: tracheostomy clean, dry, icteric PULM: CTA B, vent supported breaths, decreased bs in bases, weans on PS 5/5 CV: RRR, no mgr GI: midline scar with wound vac,wound vac  Derm: massive anasarca, some scattered bruising, yellow Neuro: nods to questions, grossly non focal LABS:  BMET  Recent Labs Lab 04/26/16 0410 04/27/16 0400 04/28/16 0450  NA 129* 130* 130*  K 3.5 3.0* 3.2*  CL 102 100* 101  CO2 19* 20* 18*  BUN 111* 120* 129*  CREATININE 1.87* 1.92* 2.09*  GLUCOSE 106* 124* 121*    Electrolytes  Recent Labs Lab 04/26/16 0410 04/27/16 0400 04/28/16 0450  CALCIUM 8.1* 8.2* 8.3*  MG 1.8 1.9 1.9  PHOS 4.6 5.9* 5.8*    CBC  Recent Labs Lab 04/26/16 0410 04/27/16 0400 04/28/16 0450  WBC 31.7* 33.2* 45.8*  HGB 8.1* 8.5* 8.2*  HCT 24.0* 25.0* 23.3*  PLT 139* 154 186    Coag's  Recent Labs Lab 04/26/16 0410 04/27/16 0505 04/28/16 0450  APTT 33 35 34  INR 1.26 1.23 1.19    Sepsis Markers  Recent Labs Lab 04/27/16 0400 04/28/16 0450  PROCALCITON 0.88 1.35    ABG  Recent Labs Lab 05/06/2016 1436 05/16/2016 1535  PHART 7.383 7.302*  PCO2ART 42.4 45.6*  PO2ART 505.0* 129.0*    Liver Enzymes  Recent Labs Lab 04/22/16 0808 04/24/16 0406 04/27/16 0400  AST 78* 165* 134*  ALT 51 90* 96*  ALKPHOS 128* 170* 227*  BILITOT 2.9* 5.1* 9.4*  ALBUMIN 1.0* 1.2* 1.2*    Cardiac Enzymes No results for input(s): TROPONINI, PROBNP in the last 168 hours.  Glucose  Recent Labs Lab 04/26/16 1155 04/26/16 1605 04/26/16 2026 04/26/16 2332 04/27/16 0333 04/27/16 0804  GLUCAP 118* 101* 98 116* 89  108*    Imaging No results found.   STUDIES:  CT head (at [facility]): negative  MRI brain  7/23: Bilateral cerebral hemispheres and tight cerebellar hemisphere patchy acute and early subacute infarcts. No mass effect or hemorrhage. Central thromboembolic source is suspected.  EEG 7/23: Abnormal due to moderate diffuse slowing of the waking background. No seizure or epiletiform discharges.  TTE 7/24: EF 50-55%, lateral wall appears hypokinetic, systolic function normal, mild MR regurg Carotid US 7/24: right mild soft plaque CCA, mild to moderate plaque origin and proximal ICA and ECA 1-39% ICA stenosis.  EEG 8/2>>>enceph, no focus  MICROBIOLOGY: MRSA PCR 7/23:  Positive  Urine Ctx 7/23:  Negative  Blood Ctx x2 7/23:  Negative  Tracheal Asp Ctx 7/23:  MRSA Blood Ctx x2 7/28>>> NGTD Urine Ctx 7/28:  Negative  ANTIBIOTICS: Vancomycin 7/23 >>>10 days  Meropenem 7/23 >> 8/1 Diflucan 7/27 >>>8/2 Ceftriaxone 8/1 >>>  SIGNIFICANT EVENTS: Loop colostomy takedown 7/18 7/18-7/22: progressively lethargic  7/23 transferred to [facility] s/p getting intubated for AMS and hypercarbia. Surgical service consulted at [facility] on arrival  7/24 on CCB gtt for af. But fully awake and following commands. Extubated. 7/25 SLP evaluation for diet, ASA started, FEES 7/28 developed worsening abdominal pain, leukocytosis, CT showing complete SMA occlusion 7/29 Underwent aorto to SMA bypass with greater saphenous vein graft and small bowel resection 7/29 Developed hypotension to the 50s in post-op period, started on pressors 7/29 Code status changed to DNR 7/30 Ex Lap by CCS w/ necrotic bowel & ischemia s/p resection w/ intact bypass & oozing over peritoneal surfaces. 8/1 OR, limited small bowel resection, anastamosis x2, vascular graft intact 8/3 neo needed again 8/4 - belly closed, Tracheostomy placed, low UOP  LINES/TUBES: OETT 7/23>>>7/24; 7/29 >>8/4 R IJ CVL 7/19 >> 8/5 L Radial Art Line 7/29 >> 8/1  patient pulled R Radial Art Line 8/1 >>>plan to dc post op 8 /4 FOLEY 7/29 >> R NGT 7/29 >> IABP 7/29 >> out (date?) Tracheostomy 8/4 >>  ASSESSMENT / PLAN:  PULMONARY A: Acute Hypercarbic Respiratory Failure Acute Hypoxic Respiratory Failure MRSA Pneumonia H/O COPD H/O OSA (intolerant of CPAP) H/O RUL Lobectomy - For NSCLC P:   Attempt  trach collar again, last 8/7 - secretions limiting VAP prevention Trach care  CARDIOVASCULAR A:  Shock -SIRS / sedation > resolving Atrial Fibrillation Elevated Troponin I - Likely demand ischemia H/O HTN  H/O Bilateral ICA Stenosis P:  Off hydrocortisone Tele   GASTROINTESTINAL A:   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 Bowel Ischemia - S/P resection 7/29 & further resection 7/30 Shock Liver > jaundice  H/O GERD  P:   TPN per surgery, trickle feeds since 8/9, once up to goal -can dc TNA since this could also be contributing to cholestasis Pepcid IV q12hr Nutrition per surgery Wound vac in place, pain control  RENAL Lab Results  Component Value Date  CREATININE 2.09 (H) 04/28/2016   CREATININE 1.92 (H) 04/27/2016   CREATININE 1.87 (H) 04/26/2016    A:   ARF, ATN  Anasarca Hypokalemia  P:   Lasix on hold Continue foley Monitor UOP Replace K+  HEMATOLOGIC A:   Leukocytosis > Anemia improving Thrombocytopenia improving  P:  Monitor for bleeding Transfuse for Hgb < 10   INFECTIOUS A:   WBC up 8/5, better 8/6 steroid stopped,Remains elevated source?  MRSA Pneumonia 7/23 - treated At risk abdo contam P:   Keep ceftriaxon d/c 8/11 ( days empiric rx) Off steroids Culture if febrile, pct slight high Check c diff & reculture Will add empiric antifungal  ENDOCRINE A:   H/O Hypothyroidism (TSH 28.2) P:   Levothyroxine -change to PO Continuing Accu-Checks q4hr SSI   NEUROLOGIC A:   Encephalopathy> ICU related, medication related, uncooperative Post-operative Pain Acute & Subacute CVA -  Bilateral cerebral hemispheres & R Cerebellum.   P:   Goal RASS: 0 Fentanyl IV prn only   FAMILY UPDATE: Husband updated 8/10  cctime x 10m RKara MeadMD. FCCP. [facility] Pager [phone] no response call [phone]     04/28/2016, 9:04 AM

## 2016-04-28 NOTE — Progress Notes (Signed)
7 Days Post-Op  Subjective: Increasing wbc. No residuals. Having some loose BMs. Elevated LFTs  Objective: Vital signs in last 24 hours: Temp:  [96.7 F (35.9 C)-97.7 F (36.5 C)] 96.7 F (35.9 C) (08/11 0356) Pulse Rate:  [26-109] 91 (08/11 0953) Resp:  [13-27] 18 (08/11 0953) BP: (106-154)/(35-103) 154/91 (08/11 0900) SpO2:  [95 %-100 %] 100 % (08/11 0953) FiO2 (%):  [40 %] 40 % (08/11 0953) Weight:  [65.9 kg (145 lb 4.5 oz)] 65.9 kg (145 lb 4.5 oz) (08/11 0500) Last BM Date: 04/28/16  Intake/Output from previous day: 08/10 0701 - 08/11 0700 In: 2037.7 [I.V.:1427.7; NG/GT:510; IV Piggyback:100] Out: 2130 [Urine:1905; Drains:225] Intake/Output this shift: Total I/O In: 255 [I.V.:165; NG/GT:90] Out: 130 [Urine:105; Drains:25]  On vent. +jaundice Wound vac intact. +anasarca Nd. TTP to abdomen.  Loose stool in bed.   Lab Results:   Recent Labs  04/27/16 0400 04/28/16 0450  WBC 33.2* 45.8*  HGB 8.5* 8.2*  HCT 25.0* 23.3*  PLT 154 186   BMET  Recent Labs  04/27/16 0400 04/28/16 0450  NA 130* 130*  K 3.0* 3.2*  CL 100* 101  CO2 20* 18*  GLUCOSE 124* 121*  BUN 120* 129*  CREATININE 1.92* 2.09*  CALCIUM 8.2* 8.3*   PT/INR  Recent Labs  04/27/16 0505 04/28/16 0450  LABPROT 15.6* 15.2  INR 1.23 1.19   ABG No results for input(s): PHART, HCO3 in the last 72 hours.  Invalid input(s): PCO2, PO2  Studies/Results: No results found.  Anti-infectives: Anti-infectives    Start     Dose/Rate Route Frequency Ordered Stop   04/29/2016 1015  cefTRIAXone (ROCEPHIN) 2 g in dextrose 5 % 50 mL IVPB     2 g 100 mL/hr over 30 Minutes Intravenous  Once 05/15/2016 1010 05/15/2016 1045   05/13/2016 1000  cefTRIAXone (ROCEPHIN) 2 g in dextrose 5 % 50 mL IVPB     2 g 100 mL/hr over 30 Minutes Intravenous Every 24 hours 04/25/2016 0846 04/28/16 1002   04/17/16 1900  vancomycin (VANCOCIN) 500 mg in sodium chloride 0.9 % 100 mL IVPB     500 mg 100 mL/hr over 60 Minutes  Intravenous Every 24 hours 04/17/16 1748 05/03/2016 1924   04/13/16 2130  fluconazole (DIFLUCAN) IVPB 200 mg  Status:  Discontinued     200 mg 100 mL/hr over 60 Minutes Intravenous Every 24 hours 04/13/16 2033 04/19/16 0939   04/13/16 1700  vancomycin (VANCOCIN) IVPB 750 mg/150 ml premix  Status:  Discontinued     750 mg 150 mL/hr over 60 Minutes Intravenous Every 24 hours 04/12/16 1814 04/17/16 1748   04/12/16 2000  fluconazole (DIFLUCAN) tablet 200 mg  Status:  Discontinued     200 mg Oral Daily 04/12/16 1758 04/13/16 2028   04/12/16 1400  meropenem (MERREM) 1 g in sodium chloride 0.9 % 100 mL IVPB  Status:  Discontinued     1 g 200 mL/hr over 30 Minutes Intravenous Every 8 hours 04/12/16 1150 05/17/2016 0846   04/10/16 0500  meropenem (MERREM) 1 g in sodium chloride 0.9 % 100 mL IVPB  Status:  Discontinued     1 g 200 mL/hr over 30 Minutes Intravenous Every 12 hours 04/08/2016 1654 04/12/16 1150   04/10/16 0500  vancomycin (VANCOCIN) 500 mg in sodium chloride 0.9 % 100 mL IVPB  Status:  Discontinued     500 mg 100 mL/hr over 60 Minutes Intravenous Every 12 hours 04/14/2016 1654 04/12/16 1814   03/22/2016 1700  meropenem (MERREM) 1 g in sodium chloride 0.9 % 100 mL IVPB     1 g 200 mL/hr over 30 Minutes Intravenous  Once 04/17/2016 1638 04/14/2016 1730   03/22/2016 1700  vancomycin (VANCOCIN) IVPB 1000 mg/200 mL premix  Status:  Discontinued     1,000 mg 200 mL/hr over 60 Minutes Intravenous  Once 04/14/2016 1638 03/28/2016 1641   03/27/2016 1700  vancomycin (VANCOCIN) 1,250 mg in sodium chloride 0.9 % 250 mL IVPB     1,250 mg 166.7 mL/hr over 90 Minutes Intravenous  Once 04/14/2016 1641 03/29/2016 1900      Assessment/Plan: S/p colostomy takedown at [facility] S/p multiple explorations, SB resections, SMA bypass S/p SB anastomosis x2 on 8/1 EXPLORATORY LAPAROTOMY, CLOSURE OF ABDOMEN 8/4  After I finished my note the nurse reported that the patient complained of nausea and felt like she was going to  retching gag. The NG was switched to suction with 300 mL's of brown thin liquid returned.  At this point we will continue nasogastric tube to low intermittent wall suction.  Rising white count is a concern. Has some elevated bilirubin and alkaline phosphatase levels. Differential for rising white count includes C. difficile colitis, acalculous cholecystitis, intestinal leak versus ongoing ischemia. Right now no signs of leak. Serous fluid in wound vac.   Check C. difficile. Check right upper quadrant ultrasound to look for cholecystitis. Doubt nuclear medicine scan of gallbladder would be helpful given LFT elevation.  If above is negative, would consider CT scan tomorrow to evaluate intra-abdominal pathology.  Discussed with CCM as well as the patient's daughter via telephone and in person. Advised daughter that if there is ongoing signs of intestinal ischemia or leak on CT imaging and there is no additional operative intervention that can be done. Patient's daughter understands  Leighton Ruff. Redmond Pulling, MD, FACS General, Bariatric, & Minimally Invasive Surgery [facility], [place]    LOS: 19 days    Gayland Curry 04/28/2016

## 2016-04-28 NOTE — Progress Notes (Signed)
  Progress Note    Pt's wound vac changed by WOC this morning.  Wound unchanged from previous inspection an d with moderate amount of yellow drainage.    Distal thigh incision is clean and dry with staples in tact.   Continue vac.  Will check again early next week.   [name], PA-C [facility] [phone] 04/28/2016 10:51 AM

## 2016-04-29 ENCOUNTER — Inpatient Hospital Stay (HOSPITAL_COMMUNITY): Payer: Medicare Other

## 2016-04-29 DIAGNOSIS — J9620 Acute and chronic respiratory failure, unspecified whether with hypoxia or hypercapnia: Secondary | ICD-10-CM

## 2016-04-29 LAB — MAGNESIUM: Magnesium: 1.8 mg/dL (ref 1.7–2.4)

## 2016-04-29 LAB — BASIC METABOLIC PANEL
Anion gap: 12 (ref 5–15)
BUN: 136 mg/dL — AB (ref 6–20)
CO2: 16 mmol/L — AB (ref 22–32)
Calcium: 8.1 mg/dL — ABNORMAL LOW (ref 8.9–10.3)
Chloride: 101 mmol/L (ref 101–111)
Creatinine, Ser: 2.23 mg/dL — ABNORMAL HIGH (ref 0.44–1.00)
GFR calc Af Amer: 23 mL/min — ABNORMAL LOW (ref >60)
GFR, EST NON AFRICAN AMERICAN: 20 mL/min — AB (ref >60)
GLUCOSE: 109 mg/dL — AB (ref 65–99)
POTASSIUM: 4.1 mmol/L (ref 3.5–5.1)
Sodium: 129 mmol/L — ABNORMAL LOW (ref 135–145)

## 2016-04-29 LAB — HEPATIC FUNCTION PANEL
ALBUMIN: 1 g/dL — AB (ref 3.5–5.0)
ALK PHOS: 241 U/L — AB (ref 38–126)
ALT: 95 U/L — ABNORMAL HIGH (ref 14–54)
AST: 132 U/L — ABNORMAL HIGH (ref 15–41)
BILIRUBIN DIRECT: 7.1 mg/dL — AB (ref 0.1–0.5)
BILIRUBIN INDIRECT: 2.9 mg/dL — AB (ref 0.3–0.9)
BILIRUBIN TOTAL: 10 mg/dL — AB (ref 0.3–1.2)
Total Protein: 3.7 g/dL — ABNORMAL LOW (ref 6.5–8.1)

## 2016-04-29 LAB — PROTIME-INR
INR: 1.36
Prothrombin Time: 16.9 seconds — ABNORMAL HIGH (ref 11.4–15.2)

## 2016-04-29 LAB — CBC
HEMATOCRIT: 22.7 % — AB (ref 36.0–46.0)
Hemoglobin: 7.8 g/dL — ABNORMAL LOW (ref 12.0–15.0)
MCH: 29.3 pg (ref 26.0–34.0)
MCHC: 34.4 g/dL (ref 30.0–36.0)
MCV: 85.3 fL (ref 78.0–100.0)
Platelets: 210 10*3/uL (ref 150–400)
RBC: 2.66 MIL/uL — ABNORMAL LOW (ref 3.87–5.11)
RDW: 18 % — AB (ref 11.5–15.5)
WBC: 34.6 10*3/uL — ABNORMAL HIGH (ref 4.0–10.5)

## 2016-04-29 LAB — URINE CULTURE: CULTURE: NO GROWTH

## 2016-04-29 LAB — PHOSPHORUS: PHOSPHORUS: 4.9 mg/dL — AB (ref 2.5–4.6)

## 2016-04-29 LAB — APTT: aPTT: 36 seconds (ref 24–36)

## 2016-04-29 LAB — PROCALCITONIN: PROCALCITONIN: 4.63 ng/mL

## 2016-04-29 MED ORDER — ZINC TRACE METAL 1 MG/ML IV SOLN
INTRAVENOUS | Status: DC
  Administered 2016-04-29: 18:00:00 via INTRAVENOUS
  Filled 2016-04-29: qty 1560

## 2016-04-29 MED ORDER — SODIUM CHLORIDE 0.9 % IV SOLN
100.0000 ug/h | INTRAVENOUS | Status: DC
  Administered 2016-04-30: 150 ug/h via INTRAVENOUS
  Administered 2016-04-30: 50 ug/h via INTRAVENOUS
  Filled 2016-04-29 (×4): qty 50

## 2016-04-29 MED ORDER — HYDROMORPHONE HCL 1 MG/ML IJ SOLN: 1.0000 mg | INTRAMUSCULAR | Status: DC | PRN

## 2016-04-29 MED ORDER — LORAZEPAM 2 MG/ML IJ SOLN
2.0000 mg/h | INTRAVENOUS | Status: DC
  Administered 2016-04-30: 5 mg/h via INTRAVENOUS
  Administered 2016-04-30: 2 mg/h via INTRAVENOUS
  Administered 2016-04-30: 5 mg/h via INTRAVENOUS
  Filled 2016-04-29 (×4): qty 25

## 2016-04-29 MED ORDER — HYDROMORPHONE HCL 2 MG PO TABS: 2.0000 mg | ORAL_TABLET | ORAL | Status: DC

## 2016-04-29 MED ORDER — LORAZEPAM BOLUS VIA INFUSION
2.0000 mg | INTRAVENOUS | Status: DC | PRN
  Administered 2016-04-30: 2 mg via INTRAVENOUS
  Filled 2016-04-29: qty 5

## 2016-04-29 MED ORDER — FAT EMULSION 20 % IV EMUL
240.0000 mL | INTRAVENOUS | Status: DC
  Administered 2016-04-29: 240 mL via INTRAVENOUS
  Filled 2016-04-29: qty 250

## 2016-04-29 NOTE — Progress Notes (Signed)
PULMONARY / CRITICAL CARE MEDICINE   Name: Amy [name] MRN: [id number] DOB: 1940/12/07    ADMISSION DATE:  03/26/2016 CONSULTATION DATE:  7/23  REFERRING MD:  [facility] per family request   CHIEF COMPLAINT:  Acute encephalopathy and progressive respiratory failure   HISTORY OF PRESENT ILLNESS:   This is a 75 year old white female who was initially admitted to [facility] hospital where she underwent an elective loop colostomy takedown on 7/18. Her immediate post-op course was c/b lethargy and actually required narcan in PACU. She never really improved w/ hospital notes continuing to raise concern for altered mental status and difficulty to arouse. She ultimately required emergent intubation for hypercarbia (CO2 52) and inability to protect airway. She was transferred per family request on 7/23 for further evaluation and care. MRI confirmed new CVA. Suspect this was multifactorial: CVA, meds, +/- pneumonia. She was extubated on 7/24 and transferred to the SDU. On 7/28, she developed worsening abdominal pain and leukocytosis. 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 On 7/29, she underwent aorto to superior mesenteric artery bypass with greater saphenous vein graft and small bowel resection. In the immediate post-op period, she became hypotensive to the 50s and was started on pressors. She was transferred back to [facility] service. After multiple surgeries her abdomen was closed on 8/4 as her gut appeared healthy at that point, tracheostomy placed on 8/4.  SUBJECTIVE:   Remains critically ill, on vent, with TNA For MRI abd    VITAL SIGNS: BP 103/73   Pulse (!) 117   Temp 97.3 F (36.3 C) (Axillary)   Resp (!) 35   Ht '5\' 6"'$  (1.676 m)   Wt 143 lb 15.4 oz (65.3 kg)   SpO2 100%   BMI 23.24 kg/m   HEMODYNAMICS:    VENTILATOR SETTINGS: Vent Mode: PRVC FiO2 (%):  [40 %] 40 % Set Rate:  [14 bmp] 14 bmp Vt Set:  [480 mL] 480 mL PEEP:  [5  cmH20] 5 cmH20 Plateau Pressure:  [2 cmH20-17 cmH20] 2 cmH20  INTAKE / OUTPUT: I/O last 3 completed shifts: In: 2829.4 [I.V.:2309.4; NG/GT:420; IV Piggyback:100] Out: 2150 [Urine:1100; Emesis/NG output:650; Drains:400]  PHYSICAL EXAMINATION: General: on vent, no distress, no follows commands HEENT: tracheostomy clean, dry, icteric PULM: CTA B, vent supported breaths, decreased bs in bases, no wean CV: RRR, no mgr GI: midline scar with wound vac,wound vac  Derm: massive anasarca, some scattered bruising, yellow, juandice worse Neuro: sedated LABS:  BMET  Recent Labs Lab 04/27/16 0400 04/28/16 0450 04/29/16 0353  NA 130* 130* 129*  K 3.0* 3.2* 4.1  CL 100* 101 101  CO2 20* 18* 16*  BUN 120* 129* 136*  CREATININE 1.92* 2.09* 2.23*  GLUCOSE 124* 121* 109*    Electrolytes  Recent Labs Lab 04/27/16 0400 04/28/16 0450 04/29/16 0353  CALCIUM 8.2* 8.3* 8.1*  MG 1.9 1.9 1.8  PHOS 5.9* 5.8* 4.9*    CBC  Recent Labs Lab 04/27/16 0400 04/28/16 0450 04/29/16 0353  WBC 33.2* 45.8* 34.6*  HGB 8.5* 8.2* 7.8*  HCT 25.0* 23.3* 22.7*  PLT 154 186 210    Coag's  Recent Labs Lab 04/27/16 0505 04/28/16 0450 04/29/16 0353  APTT 35 34 36  INR 1.23 1.19 1.36    Sepsis Markers  Recent Labs Lab 04/27/16 0400 04/28/16 0450 04/29/16 0353  PROCALCITON 0.88 1.35 4.63    ABG No results for input(s): PHART, PCO2ART, PO2ART in the last 168 hours.  Liver Enzymes  Recent Labs Lab 04/27/16 0400 04/28/16 0900 04/29/16 0353  AST 134* 116* 132*  ALT 96* 93* 95*  ALKPHOS 227* 229* 241*  BILITOT 9.4* 9.5* 10.0*  ALBUMIN 1.2* 1.1* 1.0*    Cardiac Enzymes No results for input(s): TROPONINI, PROBNP in the last 168 hours.  Glucose  Recent Labs Lab 04/26/16 1155 04/26/16 1605 04/26/16 2026 04/26/16 2332 04/27/16 0333 04/27/16 0804  GLUCAP 118* 101* 98 116* 89 108*    Imaging Dg Chest Port 1 View  Result Date: 04/29/2016 CLINICAL DATA:  Acute  hypoxemia with respiratory failure. EXAM: PORTABLE CHEST 1 VIEW COMPARISON:  04/26/2016 FINDINGS: Tracheostomy tube projects over the tracheal air shadow. Nasogastric tube enters the stomach. Left PICC line tip projects over the SVC. There is density at the right lung apex with overlying clips. Adjacent wedge resection clips noted in the right upper lung. Triangular retrocardiac airspace opacity obscures part of the left hemidiaphragm. No visible pneumothorax.  Mild volume loss in the right hemithorax. Tapering of the peripheral pulmonary vasculature favors emphysema. IMPRESSION: 1. Left lower lobe retrocardiac airspace opacity. This could reflect atelectasis or pneumonia. 2. Mild blunting of the costophrenic angles, suspicious for small bilateral pleural effusions. 3. Volume loss on the right with postoperative findings at the right lung apex, probably from a prior right upper lobectomy and/or wedge resection. 4. Tapering of the peripheral pulmonary vasculature favors emphysema. Electronically Signed   By: Van Clines M.D.   On: 04/29/2016 08:28   US Abdomen Limited Ruq  Result Date: 04/28/2016 CLINICAL DATA:  Prolonged hospitalization, elevated LFTs, prior small bowel resections, question acalculous cholecystitis EXAM: US ABDOMEN LIMITED - RIGHT UPPER QUADRANT COMPARISON:  None. FINDINGS: Gallbladder: Inadequately visualized due to bowel gas. Additionally, perihepatic fluid is present on the recent CT, making it difficult to differentiate between gallbladder and perihepatic fluid. Gallbladder is not confidently visualized to assess. Common bile duct: Diameter: 4 mm diameter Liver: Diffusely coarsened echogenicity without focal abnormality. Hepatopetal portal venous flow. Perihepatic free fluid, containing scattered internal echoes versus artifacts. IMPRESSION: Perihepatic fluid. Gallbladder is not adequately visualized to assess. Electronically Signed   By: Lavonia Dana M.D.   On: 04/28/2016 20:04      STUDIES:  CT head (at [facility]): negative  MRI brain  7/23: Bilateral cerebral hemispheres and tight cerebellar hemisphere patchy acute and early subacute infarcts. No mass effect or hemorrhage. Central thromboembolic source is suspected.  EEG 7/23: Abnormal due to moderate diffuse slowing of the waking background. No seizure or epiletiform discharges.  TTE 7/24: EF 50-55%, lateral wall appears hypokinetic, systolic function normal, mild MR regurg Carotid US 7/24: right mild soft plaque CCA, mild to moderate plaque origin and proximal ICA and ECA 1-39% ICA stenosis.  EEG 8/2>>>enceph, no focus 8/12 MRI ABD>>  MICROBIOLOGY: MRSA PCR 7/23:  Positive  Urine Ctx 7/23:  Negative  Blood Ctx x2 7/23:  Negative  Tracheal Asp Ctx 7/23:  MRSA Blood Ctx x2 7/28>>> NGTD Urine Ctx 7/28:  Negative  ANTIBIOTICS: Vancomycin 7/23 >>>10 days  Meropenem 7/23 >> 8/1 Diflucan 7/27 >>>8/2 Ceftriaxone 8/1 >>>  SIGNIFICANT EVENTS: Loop colostomy takedown 7/18 7/18-7/22: progressively lethargic  7/23 transferred to [facility] s/p getting intubated for AMS and hypercarbia. Surgical service consulted at [facility] on arrival  7/24 on CCB gtt for af. But fully awake and following commands. Extubated. 7/25 SLP evaluation for diet, ASA started, FEES 7/28 developed worsening abdominal pain, leukocytosis, CT showing complete SMA occlusion 7/29 Underwent aorto to SMA bypass with  greater saphenous vein graft and small bowel resection 7/29 Developed hypotension to the 50s in post-op period, started on pressors 7/29 Code status changed to DNR 7/30 Ex Lap by CCS w/ necrotic bowel & ischemia s/p resection w/ intact bypass & oozing over peritoneal surfaces. 8/1 OR, limited small bowel resection, anastamosis x2, vascular graft intact 8/3 neo needed again 8/4 - belly closed, Tracheostomy placed, low UOP 8/5 FTT  LINES/TUBES: OETT 7/23>>>7/24; 7/29 >>8/4 R IJ CVL 7/19 >> 8/5 L Radial Art Line 7/29 >> 8/1 patient  pulled R Radial Art Line 8/1 >>>plan to dc post op 8 /4 FOLEY 7/29 >> R NGT 7/29 >> IABP 7/29 >> out (date?) Tracheostomy 8/4 >>  ASSESSMENT / PLAN:  PULMONARY A: Acute Hypercarbic Respiratory Failure Acute Hypoxic Respiratory Failure MRSA Pneumonia H/O COPD H/O OSA (intolerant of CPAP) H/O RUL Lobectomy - For NSCLC P:   Attempt  trach collar , not tolerating VAP prevention Trach care  CARDIOVASCULAR A:  Shock -SIRS / sedation > resolving Atrial Fibrillation Elevated Troponin I - Likely demand ischemia H/O HTN  H/O Bilateral ICA Stenosis P:  Off hydrocortisone Tele   GASTROINTESTINAL A:   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 May be time for transition to comfort care  RENAL Lab Results  Component Value Date   CREATININE 2.23 (H) 04/29/2016   CREATININE 2.09 (H) 04/28/2016   CREATININE 1.92 (H) 04/27/2016    A:   ARF, ATN  Anasarca Hypokalemia  P:   Lasix on hold Continue foley Monitor UOP Replace K+ as needed  HEMATOLOGIC A:   Leukocytosis > Anemia improving Thrombocytopenia improving  P:  Monitor for bleeding Transfuse for Hgb < 10   INFECTIOUS A:   WBC up  MRSA Pneumonia 7/23 - treated At risk abdo contam P:   Keep ceftriaxon d/c 8/11 ( days empiric rx) Off steroids Culture if febrile, pct slight high Check c diff  neg& reculture Will add empiric antifungal  ENDOCRINE CBG (last 3)   Recent Labs  04/26/16 2332 04/27/16 0333 04/27/16 0804  GLUCAP 116* 89 108*     A:   H/O Hypothyroidism (TSH 28.2) P:   Levothyroxine -change to PO Continuing Accu-Checks q4hr SSI   NEUROLOGIC A:    Encephalopathy> ICU related, medication related, uncooperative Post-operative Pain 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   P:   Goal RASS: 0 Fentanyl IV prn only   FAMILY UPDATE: Husband updated 8/10    Richardson Landry Ashea Winiarski ACNP Maryanna Shape PCCM Pager [phone] till 3 pm If no answer page [phone] 04/29/2016, 10:57 AM

## 2016-04-29 NOTE — Progress Notes (Signed)
Met with family and discussed results of head CT and MRCP which suggest ongoing thromboemoblic phenomena. I advised that her chances of having good QOL were low given her lack of progression, particularly with concerns for her new stroke. They felt that if she were unable to have full function back, she would not wish for medical interventions meant to prolong life. I recommended removal of these and focusing on comfort. They tearfully agreed to this plan. I recommended initiation of opiates and benzodiazepines for comfort prior to removal of mechanical ventilation. Depending on response, she may die in the hospital or go home with hospice.  CRITICAL CARE Performed by: Luz Brazen   Total critical care time: 30 minutes  Critical care time was exclusive of separately billable procedures and treating other patients.  Critical care was necessary to treat or prevent imminent or life-threatening deterioration.  Critical care was time spent personally by me on the following activities: development of treatment plan with patient and/or surrogate as well as nursing, discussions with consultants, evaluation of patient's response to treatment, examination of patient, obtaining history from patient or surrogate, ordering and performing treatments and interventions, ordering and review of laboratory studies, ordering and review of radiographic studies, pulse oximetry and re-evaluation of patient's condition.   Luz Brazen, MD Pulmonary & Critical Care Medicine April 29, 2016, 10:49 PM

## 2016-04-29 NOTE — Progress Notes (Signed)
PARENTERAL NUTRITION CONSULT NOTE - FOLLOW UP  Pharmacy Consult:  TPN Indication:  Massive bowel resection  Patient Measurements: Height: '5\' 6"'$  (167.6 cm) Weight: 143 lb 15.4 oz (65.3 kg) IBW/kg (Calculated) : 59.3 Adjusted Body Weight: 63.9 kg  Vital Signs: Temp: 99.1 F (37.3 C) (08/12 0350) Temp Source: Oral (08/12 0350) BP: 103/73 (08/12 0700) Pulse Rate: 84 (08/12 0700) Intake/Output from previous day: 08/11 0701 - 08/12 0700 In: 1789.4 [I.V.:1539.4; NG/GT:150; IV Piggyback:100] Out: 1610 [Urine:720; Emesis/NG output:650; Drains:225] Intake/Output from this shift: No intake/output data recorded.   Insulin Requirements in the past 24 hours:  SSI d/c'ed 8/10  Assessment: 33 YOF with bowel ischemia and SMA thrombus who is s/p mesenteric artery bypass and SBR on 03/26/2016.  She returned to the OR on 04/12/2016 for removal of 2 more short segments of small bowel.  Per CCS, overall most of small bowel is viable.  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   GI: GERD with Pepcid in TPN -  prealbumin up 5.7>7.4, LBM 7/27, drains O/P down to 28m, NG O/P 6567m  RLE serous drainage noted - likely d/t anasarca.  Endo: hypothyroid on Synthroid, TSH 28 > 13 post dose increase.  No hx DM - CBGs controlled Lytes: low Na/CO2, CoCa high normal at 10.5, Phos elevated at 4.9 (Ca x Phos = 51, goal < 55) *days without lytes in TPN: 8/4 > 8/7, 8/10 > 8/12 Renal: SCr up 2.23, BUN up 136 - UOP decreasing 0.5 ml/kg/hr, net +16L since admit Pulm: COPD / OSA / NSCLC post RUL lobectomy. Trach placed 8/4 - remains on vent, FiO2 40%, intermittent use of trach - Pulmicort, Duonebs. Cards: HTN (EF 50-55%) - BP soft, HR variable (33-120, in Afib) - PRN Lasix Hepatobil: LFTs elevated and trending up, tbili elevated at 10 (significant jaundice on exam). TG/ lipase/amylase WNL  AC: Eliquis for Afib and new CVA on hold - hgb 7.8, plts normalized Neuro: new CVA on 7/23,  encephalopathy on 8/2 EEG - GCS 12, CPOT 0-1 ID: [id number] D#2 (8/11 >> ), s/p 10d vanc for MRSA PNA and 11d CTX, repeat cx negative.  Afebrile, WBC down 34.6, PCT up 4.63 Best Practices: SCDs, Pepcid, MC TPN Access: PICC triple lumen  TPN start date: 04/17/2016  Current Nutrition: TPN TF held 8/11  Nutritional Goals: 1312 kCal and 105-115 g of protein   Plan:  - Continue Clinimix 5/15 (no electrolytes) at 65 ml/hr and lipids at 10 ml/hr on TTS only to minimize kCal provision.  TPN provides a weekly average of 1313 kCal and 78 gm of protein per day, meeting 100% of patient's kCal and 74% of protein needs. - Watch BUN and adjust protein provision as appropriate.  TPN is providing 1.2 g/kg/d of protein (decreasing). - Daily multivitamin in TPN.  No trace elements in TPN given elevated tbili and jaundice, add back zinc '5mg'$ , chromium 1060mand selenium 60 mcg - Daily Pepcid '20mg'$  in TPN - Will patient benefit from anaerobic coverage?    Debra Calabretta D. DanMina MarbleharmD, BCPS Pager:  [phone]/2017, 7:58 AM

## 2016-04-29 NOTE — Progress Notes (Signed)
8 Days Post-Op  Subjective: Pt on trach / vent  Stable HD  Thigh wound vac off due to inability to keep it sealed   Objective: Vital signs in last 24 hours: Temp:  [94 F (34.4 C)-99.1 F (37.3 C)] 97.3 F (36.3 C) (08/12 0755) Pulse Rate:  [33-122] 117 (08/12 0754) Resp:  [14-35] 35 (08/12 0754) BP: (103-139)/(21-110) 103/73 (08/12 0700) SpO2:  [93 %-100 %] 100 % (08/12 0800) FiO2 (%):  [40 %] 40 % (08/12 0800) Weight:  [65.3 kg (143 lb 15.4 oz)] 65.3 kg (143 lb 15.4 oz) (08/12 0500) Last BM Date: 04/28/16  Intake/Output from previous day: 08/11 0701 - 08/12 0700 In: 1864.4 [I.V.:1614.4; NG/GT:150; IV Piggyback:100] Out: 3536 [Urine:720; Emesis/NG output:650; Drains:225] Intake/Output this shift: Total I/O In: 240 [I.V.:150; NG/GT:90] Out: 220 [Urine:20; Emesis/NG output:150; Drains:50]  Incision/Wound:vac in place flat serous drainage not rigid   Lab Results:   Recent Labs  04/28/16 0450 04/29/16 0353  WBC 45.8* 34.6*  HGB 8.2* 7.8*  HCT 23.3* 22.7*  PLT 186 210   BMET  Recent Labs  04/28/16 0450 04/29/16 0353  NA 130* 129*  K 3.2* 4.1  CL 101 101  CO2 18* 16*  GLUCOSE 121* 109*  BUN 129* 136*  CREATININE 2.09* 2.23*  CALCIUM 8.3* 8.1*   PT/INR  Recent Labs  04/28/16 0450 04/29/16 0353  LABPROT 15.2 16.9*  INR 1.19 1.36   ABG No results for input(s): PHART, HCO3 in the last 72 hours.  Invalid input(s): PCO2, PO2  Studies/Results: Dg Chest Port 1 View  Result Date: 04/29/2016 CLINICAL DATA:  Acute hypoxemia with respiratory failure. EXAM: PORTABLE CHEST 1 VIEW COMPARISON:  04/26/2016 FINDINGS: Tracheostomy tube projects over the tracheal air shadow. Nasogastric tube enters the stomach. Left PICC line tip projects over the SVC. There is density at the right lung apex with overlying clips. Adjacent wedge resection clips noted in the right upper lung. Triangular retrocardiac airspace opacity obscures part of the left hemidiaphragm. No visible  pneumothorax.  Mild volume loss in the right hemithorax. Tapering of the peripheral pulmonary vasculature favors emphysema. IMPRESSION: 1. Left lower lobe retrocardiac airspace opacity. This could reflect atelectasis or pneumonia. 2. Mild blunting of the costophrenic angles, suspicious for small bilateral pleural effusions. 3. Volume loss on the right with postoperative findings at the right lung apex, probably from a prior right upper lobectomy and/or wedge resection. 4. Tapering of the peripheral pulmonary vasculature favors emphysema. Electronically Signed   By: Van Clines M.D.   On: 04/29/2016 08:28   US Abdomen Limited Ruq  Result Date: 04/28/2016 CLINICAL DATA:  Prolonged hospitalization, elevated LFTs, prior small bowel resections, question acalculous cholecystitis EXAM: US ABDOMEN LIMITED - RIGHT UPPER QUADRANT COMPARISON:  None. FINDINGS: Gallbladder: Inadequately visualized due to bowel gas. Additionally, perihepatic fluid is present on the recent CT, making it difficult to differentiate between gallbladder and perihepatic fluid. Gallbladder is not confidently visualized to assess. Common bile duct: Diameter: 4 mm diameter Liver: Diffusely coarsened echogenicity without focal abnormality. Hepatopetal portal venous flow. Perihepatic free fluid, containing scattered internal echoes versus artifacts. IMPRESSION: Perihepatic fluid. Gallbladder is not adequately visualized to assess. Electronically Signed   By: Lavonia Dana M.D.   On: 04/28/2016 20:04    Anti-infectives: Anti-infectives    Start     Dose/Rate Route Frequency Ordered Stop   04/28/16 1200  anidulafungin (ERAXIS) 100 mg in sodium chloride 0.9 % 100 mL IVPB     100 mg over 90 Minutes Intravenous  Every 24 hours 04/28/16 1112     05/06/2016 1015  cefTRIAXone (ROCEPHIN) 2 g in dextrose 5 % 50 mL IVPB     2 g 100 mL/hr over 30 Minutes Intravenous  Once 05/04/2016 1010 04/23/2016 1045   05/15/2016 1000  cefTRIAXone (ROCEPHIN) 2 g in  dextrose 5 % 50 mL IVPB     2 g 100 mL/hr over 30 Minutes Intravenous Every 24 hours 05/07/2016 0846 04/28/16 1002   04/17/16 1900  vancomycin (VANCOCIN) 500 mg in sodium chloride 0.9 % 100 mL IVPB     500 mg 100 mL/hr over 60 Minutes Intravenous Every 24 hours 04/17/16 1748 05/07/2016 1924   04/13/16 2130  fluconazole (DIFLUCAN) IVPB 200 mg  Status:  Discontinued     200 mg 100 mL/hr over 60 Minutes Intravenous Every 24 hours 04/13/16 2033 04/19/16 0939   04/13/16 1700  vancomycin (VANCOCIN) IVPB 750 mg/150 ml premix  Status:  Discontinued     750 mg 150 mL/hr over 60 Minutes Intravenous Every 24 hours 04/12/16 1814 04/17/16 1748   04/12/16 2000  fluconazole (DIFLUCAN) tablet 200 mg  Status:  Discontinued     200 mg Oral Daily 04/12/16 1758 04/13/16 2028   04/12/16 1400  meropenem (MERREM) 1 g in sodium chloride 0.9 % 100 mL IVPB  Status:  Discontinued     1 g 200 mL/hr over 30 Minutes Intravenous Every 8 hours 04/12/16 1150 04/27/2016 0846   04/10/16 0500  meropenem (MERREM) 1 g in sodium chloride 0.9 % 100 mL IVPB  Status:  Discontinued     1 g 200 mL/hr over 30 Minutes Intravenous Every 12 hours 03/31/2016 1654 04/12/16 1150   04/10/16 0500  vancomycin (VANCOCIN) 500 mg in sodium chloride 0.9 % 100 mL IVPB  Status:  Discontinued     500 mg 100 mL/hr over 60 Minutes Intravenous Every 12 hours 04/11/2016 1654 04/12/16 1814   04/04/2016 1700  meropenem (MERREM) 1 g in sodium chloride 0.9 % 100 mL IVPB     1 g 200 mL/hr over 30 Minutes Intravenous  Once 04/08/2016 1638 04/02/2016 1730   03/26/2016 1700  vancomycin (VANCOCIN) IVPB 1000 mg/200 mL premix  Status:  Discontinued     1,000 mg 200 mL/hr over 60 Minutes Intravenous  Once 03/26/2016 1638 03/30/2016 1641   03/19/2016 1700  vancomycin (VANCOCIN) 1,250 mg in sodium chloride 0.9 % 250 mL IVPB     1,250 mg 166.7 mL/hr over 90 Minutes Intravenous  Once 04/10/2016 1641 03/29/2016 1900      Assessment/Plan: s/p Procedure(s): EXPLORATORY LAPAROTOMY,  CLOSURE  OF ABDOMEN (N/A)    Patient Active Problem List   Diagnosis Date Noted  . Acute weakness   . Cerebrovascular accident (CVA) due to thrombosis of precerebral artery ([place])   . Abdominal pain   . Pneumonia of both upper lobes due to methicillin resistant Staphylococcus aureus (MRSA) ([place])   . Ischemic bowel disease ([place])   . HCAP (healthcare-associated pneumonia)   . Carotid artery stenosis   . Oral candidiasis   . Ileus, postoperative   . Pleural effusion   . History of CVA (cerebrovascular accident)   . COPD exacerbation ([place])   . OSA (obstructive sleep apnea)   . Supplemental oxygen dependent   . Paroxysmal atrial fibrillation ([facility])   . Dysphagia, post-stroke   . Hx of colostomy   . Tachycardia   . Atrial fibrillation with RVR ([place])   . Acute blood loss anemia   . Leukocytosis   .  Acute respiratory failure ([place])   . Cerebrovascular accident (CVA) due to bilateral embolism of carotid arteries   . Acute encephalopathy 03/30/2016  WBC down Etiology unclear  U/S not helpful due to bowel gas  CT not helpful without contrast and Cr too high She is otherwise stable Limited surgical options and not a candidate for any more surgery MRCP may help evaluate liver and biliary tree to further evaluate abnormal LFT's.   These appear stable today as well .    LOS: 20 days    Amy [name] A. 04/29/2016

## 2016-04-30 DIAGNOSIS — Z66 Do not resuscitate: Secondary | ICD-10-CM

## 2016-04-30 DIAGNOSIS — Z515 Encounter for palliative care: Secondary | ICD-10-CM

## 2016-04-30 MED ORDER — LORAZEPAM BOLUS VIA INFUSION
1.0000 mg | INTRAVENOUS | Status: DC | PRN
  Filled 2016-04-30: qty 4

## 2016-04-30 MED ORDER — DEXTROSE 5 % IV SOLN
INTRAVENOUS | Status: DC
  Administered 2016-04-30: via INTRAVENOUS

## 2016-04-30 NOTE — Progress Notes (Signed)
Patient ID: Amy [name], female   DOB: 1941/04/06, 75 y.o.   MRN: [id number] Events noted overnight, patient made comfort care, discussed with husband this am

## 2016-04-30 NOTE — Progress Notes (Signed)
eLink Physician-Brief Progress Note Patient Name: Amy [name] DOB: 1941-08-24 MRN: [id number]   Date of Service  04/25/2016  HPI/Events of Note  Pt in comfort care mode, nurse requesting higher caps on comfort meds  eICU Interventions  done     Intervention Category Minor Interventions: Routine modifications to care plan (e.g. PRN medications for pain, fever)  Christinia Gully 04/23/2016, 7:51 PM

## 2016-04-30 NOTE — Progress Notes (Signed)
PULMONARY / CRITICAL CARE MEDICINE   Name: Amy [name] MRN: [id number] DOB: 07/07/1941    ADMISSION DATE:  03/23/2016 CONSULTATION DATE:  7/23  REFERRING MD:  [facility] hospital per family request   CHIEF COMPLAINT:  Acute encephalopathy and progressive respiratory failure   HISTORY OF PRESENT ILLNESS:   This is a 75 year old white female who was initially admitted to [facility] hospital where she underwent an elective loop colostomy takedown on 7/18. Her immediate post-op course was c/b lethargy and actually required narcan in PACU. She never really improved w/ hospital notes continuing to raise concern for altered mental status and difficulty to arouse. She ultimately required emergent intubation for hypercarbia (CO2 52) and inability to protect airway. She was transferred per family request on 7/23 for further evaluation and care. MRI confirmed new CVA. Suspect this was multifactorial: CVA, meds, +/- pneumonia. She was extubated on 7/24 and transferred to the SDU. On 7/28, she developed worsening abdominal pain and leukocytosis. 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 On 7/29, she underwent aorto to superior mesenteric artery bypass with greater saphenous vein graft and small bowel resection. In the immediate post-op period, she became hypotensive to the 50s and was started on pressors. She was transferred back to [facility] service. After multiple surgeries her abdomen was closed on 8/4 as her gut appeared healthy at that point, tracheostomy placed on 8/4.  SUBJECTIVE:   Full comfort care mode  VITAL SIGNS: BP (!) 138/44   Pulse (!) 47   Temp 97.5 F (36.4 C) (Oral)   Resp (!) 3   Ht '5\' 6"'$  (1.676 m)   Wt 143 lb 15.4 oz (65.3 kg)   SpO2 99%   BMI 23.24 kg/m   HEMODYNAMICS:    VENTILATOR SETTINGS: Vent Mode: PRVC FiO2 (%):  [40 %] 40 % Set Rate:  [14 bmp] 14 bmp Vt Set:  [480 mL] 480 mL PEEP:  [5 cmH20] 5 cmH20 Plateau Pressure:  [12  cmH20-14 cmH20] 14 cmH20  INTAKE / OUTPUT: I/O last 3 completed shifts: In: 2521.1 [I.V.:2211.1; NG/GT:180; IV Piggyback:130] Out: 1749 [Urine:455; Emesis/NG output:450; Drains:200]  PHYSICAL EXAMINATION: General: off vent HEENT: tracheostomy clean, dry, icteric PULM: faint air movement CV: RRR, no mgr GI: midline scar with wound vac,wound vac  Derm: massive anasarca, some scattered bruising, yellow, juandice worse Neuro: sedated LABS:  BMET  Recent Labs Lab 04/27/16 0400 04/28/16 0450 04/29/16 0353  NA 130* 130* 129*  K 3.0* 3.2* 4.1  CL 100* 101 101  CO2 20* 18* 16*  BUN 120* 129* 136*  CREATININE 1.92* 2.09* 2.23*  GLUCOSE 124* 121* 109*    Electrolytes  Recent Labs Lab 04/27/16 0400 04/28/16 0450 04/29/16 0353  CALCIUM 8.2* 8.3* 8.1*  MG 1.9 1.9 1.8  PHOS 5.9* 5.8* 4.9*    CBC  Recent Labs Lab 04/27/16 0400 04/28/16 0450 04/29/16 0353  WBC 33.2* 45.8* 34.6*  HGB 8.5* 8.2* 7.8*  HCT 25.0* 23.3* 22.7*  PLT 154 186 210    Coag's  Recent Labs Lab 04/27/16 0505 04/28/16 0450 04/29/16 0353  APTT 35 34 36  INR 1.23 1.19 1.36    Sepsis Markers  Recent Labs Lab 04/27/16 0400 04/28/16 0450 04/29/16 0353  PROCALCITON 0.88 1.35 4.63    ABG No results for input(s): PHART, PCO2ART, PO2ART in the last 168 hours.  Liver Enzymes  Recent Labs Lab 04/27/16 0400 04/28/16 0900 04/29/16 0353  AST 134* 116* 132*  ALT  96* 93* 95*  ALKPHOS 227* 229* 241*  BILITOT 9.4* 9.5* 10.0*  ALBUMIN 1.2* 1.1* 1.0*    Cardiac Enzymes No results for input(s): TROPONINI, PROBNP in the last 168 hours.  Glucose  Recent Labs Lab 04/26/16 1155 04/26/16 1605 04/26/16 2026 04/26/16 2332 04/27/16 0333 04/27/16 0804  GLUCAP 118* 101* 98 116* 89 108*    Imaging Ct Head Wo Contrast  Result Date: 04/29/2016 CLINICAL DATA:  Unresponsive, intubated, multiple prior abdominal surgeries, history carotid stenosis, non-small-cell lung cancer, hypertension,  COPD, encephalopathy, stroke EXAM: CT HEAD WITHOUT CONTRAST TECHNIQUE: Contiguous axial images were obtained from the base of the skull through the vertex without intravenous contrast. Sagittal and coronal MPR images reconstructed from axial data set. COMPARISON:  04/08/2016 FINDINGS: Generalized atrophy. Normal ventricular morphology. No midline shift. New area of low-attenuation within LEFT frontal lobe with loss of gray-white differentiation likely representing infarct. Patchy areas of lower attenuation in the RIGHT frontal lobe as well question infarct or artifact. No intracranial hemorrhage, mass lesion, or extra-axial fluid collection. Atherosclerotic calcification of internal carotid and vertebral arteries at skullbase. Bones and sinuses unremarkable. IMPRESSION: New LEFT frontal infarct. Question infarct versus artifact at RIGHT frontal lobe. Electronically Signed   By: Lavonia Dana M.D.   On: 04/29/2016 17:58   Mr Abdomen Mrcp Wo Cm  Result Date: 04/29/2016 CLINICAL DATA:  Jaundice. Elevated bilirubin. Fever. Recently postop from mesenteric artery bypass and small bowel resection. EXAM: MRI ABDOMEN WITHOUT CONTRAST  (INCLUDING MRCP) TECHNIQUE: Multiplanar multisequence MR imaging of the abdomen was performed. Heavily T2-weighted images of the biliary and pancreatic ducts were obtained, and three-dimensional MRCP images were rendered by post processing. COMPARISON:  CT on 11/15/2015 FINDINGS: Exam degraded by respiratory motion artifact. Lower chest: Small left pleural effusion has decreased in size since previous study. Previously seen right pleural effusion no longer visualized. Mild bibasilar atelectasis again noted. Hepatobiliary: Evaluation limited by respiratory motion artifact. Ill-defined bandlike area of T2 hyperintensity is seen within the inferior right hepatic lobe (image 19/series 8). This does not have typical appearance of neoplasm, and is suspicious for hepatic infarct or contusion. No  definite liver masses identified on this unenhanced exam. Tiny sub-cm cyst again seen in the anterior liver dome. T1 hyperintense sludge is seen within the gallbladder. No evidence of acute cholecystitis. Although limited by motion artifact, there is no evidence of biliary ductal dilatation with common bile duct measuring 6 mm proximally. Pancreas: No mass or inflammatory process visualized on this unenhanced exam. No evidence of pancreatic ductal dilatation. Spleen: Within normal limits in size. Diffuse heterogeneous splenic T2 hyperintensity is seen, suspicious for splenic infarction. Adrenals/Urinary Tract: No adrenal mass identified. No evidence of renal mass or hydronephrosis. Stomach/Bowel: No evidence of bowel obstruction. Mild ascites and small amount of postop free air noted. Vascular/Lymphatic: No pathologically enlarged lymph nodes identified. No evidence of abdominal aortic aneurysm. Other:  Diffuse body wall edema again noted. Musculoskeletal:  No suspicious bone lesions identified. IMPRESSION: Exam degradation due to respiratory motion artifact. No evidence of biliary ductal dilatation. Irregular bandlike area of T2 hyperintensity within the right hepatic lobe, suspicious for hepatic infarct or contusion. No definite liver mass visualized on this unenhanced exam. Diffuse heterogeneous T2 hyperintensity throughout the spleen, suspicious for splenic infarction. Mild ascites and small amount of postop free air. Diffuse body wall edema. Decreased pleural effusions and bibasilar atelectasis. Electronically Signed   By: Earle Gell M.D.   On: 04/29/2016 18:23     STUDIES:  CT head (  at [facility]): negative  MRI brain  7/23: Bilateral cerebral hemispheres and tight cerebellar hemisphere patchy acute and early subacute infarcts. No mass effect or hemorrhage. Central thromboembolic source is suspected.  EEG 7/23: Abnormal due to moderate diffuse slowing of the waking background. No seizure or epiletiform  discharges.  TTE 7/24: EF 50-55%, lateral wall appears hypokinetic, systolic function normal, mild MR regurg Carotid US 7/24: right mild soft plaque CCA, mild to moderate plaque origin and proximal ICA and ECA 1-39% ICA stenosis.  EEG 8/2>>>enceph, no focus 8/12 MRI ABD>>  MICROBIOLOGY: MRSA PCR 7/23:  Positive  Urine Ctx 7/23:  Negative  Blood Ctx x2 7/23:  Negative  Tracheal Asp Ctx 7/23:  MRSA Blood Ctx x2 7/28>>> NGTD Urine Ctx 7/28:  Negative  ANTIBIOTICS: Vancomycin 7/23 >>>10 days  Meropenem 7/23 >> 8/1 Diflucan 7/27 >>>8/2 Ceftriaxone 8/1 >>>  SIGNIFICANT EVENTS: Loop colostomy takedown 7/18 7/18-7/22: progressively lethargic  7/23 transferred to [facility] s/p getting intubated for AMS and hypercarbia. Surgical service consulted at [facility] on arrival  7/24 on CCB gtt for af. But fully awake and following commands. Extubated. 7/25 SLP evaluation for diet, ASA started, FEES 7/28 developed worsening abdominal pain, leukocytosis, CT showing complete SMA occlusion 7/29 Underwent aorto to SMA bypass with greater saphenous vein graft and small bowel resection 7/29 Developed hypotension to the 50s in post-op period, started on pressors 7/29 Code status changed to DNR 7/30 Ex Lap by CCS w/ necrotic bowel & ischemia s/p resection w/ intact bypass & oozing over peritoneal surfaces. 8/1 OR, limited small bowel resection, anastamosis x2, vascular graft intact 8/3 neo needed again 8/4 - belly closed, Tracheostomy placed, low UOP 8/5 FTT  LINES/TUBES: OETT 7/23>>>7/24; 7/29 >>8/4 R IJ CVL 7/19 >> 8/5 L Radial Art Line 7/29 >> 8/1 patient pulled R Radial Art Line 8/1 >>>plan to dc post op 8 /4 FOLEY 7/29 >> R NGT 7/29 >> IABP 7/29 >> out (date?) Tracheostomy 8/4 >>  ASSESSMENT / PLAN: Full comfort care DNR Fet/versed drips.    FAMILY UPDATE: Husband and daughter at bedside. Full comfort care with fentanyl/versed drips/vent dc'd.   Richardson Landry Kohle Winner ACNP Maryanna Shape PCCM Pager [phone]  till 3 pm If no answer page [phone] 05/18/2016, 8:57 AM

## 2016-04-30 NOTE — Progress Notes (Signed)
Vent dc'd per withdrawal protocol.

## 2016-04-30 NOTE — Progress Notes (Signed)
MD Tamala Julian gave verbal order to remove ventilation per terminal wean protocol and for 2 RNs to pronounce death of patient. RT Shannon at bedside to remove vent. Family surrounding patient and in agreement to go ahead and proceed with removal of ventilator. Will continue to monitor patient and manage pain.  Levon Hedger, RN

## 2016-04-30 NOTE — Progress Notes (Signed)
22:31 Asystole  22:33 Agonal rhythm  22:35 Time of death confirmed by two RN's - Norberta Keens and Lorrin Jackson. No heart rate, no respirations. MD notified (CCM). Family at bedside.  31: [place] notified - RN spoke with [employer].

## 2016-04-30 NOTE — Progress Notes (Signed)
Joined MD Ancil Linsey in a family meeting for patient about further treatment plan. Plan for tonight is to keep patient comfortable and allow family members to see patient before removing ventilator. All family was in agreement on this decision. Orders received (see MAR) for comfort care. Started orders and will continue to monitor patient and titrate medications based on assessment.   Levon Hedger, RN

## 2016-05-03 LAB — CULTURE, BLOOD (ROUTINE X 2)
Culture: NO GROWTH
Culture: NO GROWTH

## 2016-05-08 ENCOUNTER — Telehealth: Payer: Self-pay

## 2016-05-08 NOTE — Telephone Encounter (Signed)
On 05/08/2016 I received a death certificate from [place] (Original). The death certificate is for burial. The patient is a patient of Doctor Ramaswamy. The death certificate will be taken to Pulmonary Unit @ [facility] this am for signature.  On 2016/06/08 I received the death certificate back from Doctor Ramaswamy. I got the death certificate ready and called the funeral home to let them know the death certificate was being mailed to the [facility] Dept per their request.

## 2016-05-19 NOTE — Progress Notes (Signed)
50 cc fentanyl and 20 cc ativan wasted in sink with Lorrin [place], RN.

## 2016-05-19 DEATH — deceased

## 2016-06-18 NOTE — Discharge Summary (Signed)
DISCHARGE SUMMARY    Date of admit: 04/13/2016  1:48 PM Date of discharge: 05/31/16  1:31 AM Length of Stay: 22 days  PCP is No primary care provider on file.   PROBLEM LIST Active Problems:   Acute encephalopathy   Acute respiratory failure (HCC)   Cerebrovascular accident (CVA) due to bilateral embolism of carotid arteries   History of CVA (cerebrovascular accident)   COPD exacerbation (HCC)   OSA (obstructive sleep apnea)   Supplemental oxygen dependent   Paroxysmal atrial fibrillation (HCC)   Dysphagia, post-stroke   Hx of colostomy   Tachycardia   Atrial fibrillation with RVR (HCC)   Acute blood loss anemia   Leukocytosis   HCAP (healthcare-associated pneumonia)   Carotid artery stenosis   Oral candidiasis   Ileus, postoperative   Pleural effusion   Abdominal pain   Pneumonia of both upper lobes due to methicillin resistant Staphylococcus aureus (MRSA) (HCC)   Ischemic bowel disease ([place])   Cerebrovascular accident (CVA) due to thrombosis of precerebral artery ([place])   Acute weakness   Terminal care   DNAR (do not attempt resuscitation)    SUMMARY Amy [name] was 75 y.o. patient with    has a past medical history of Bilateral carotid artery stenosis; COPD (chronic obstructive pulmonary disease) ([place]); GERD (gastroesophageal reflux disease); HTN (hypertension); Hypothyroidism; Non-small cell lung cancer ([place]); and OSA (obstructive sleep apnea).   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   Admitted on 03/21/2016  with    75 year old white female who was initially admitted to [facility] hospital where she underwent an elective loop colostomy takedown on 7/18. Her immediate post-op course was c/b lethargy and actually required narcan in PACU. She never really improved w/ hospital notes continuing to raise concern for altered mental status and difficulty to arouse. She ultimately required emergent intubation for hypercarbia (CO2 52) and inability to protect airway. She was transferred per family request on 03/24/2016 for further evaluation and care. MRI confirmed new CVA. Suspect this was multifactorial: CVA, meds, +/- pneumonia. She was extubated on 04/10/16 and transferred to the SDU. On 04/14/16, she developed worsening abdominal pain and leukocytosis. 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 On 03/28/2016, she underwent aorto to superior mesenteric artery bypass with greater saphenous vein graft and small bowel resection. In the immediate post-op period, she became hypotensive to the 50s and was started on pressors. She was transferred back to [facility] service. After multiple surgeries her abdomen was closed on 05/04/2016 as her gut appeared healthy at that point, tracheostomy placed on 05/09/2016  EVENTS Loop colostomy takedown 7/18 7/18-7/22: progressively lethargic  7/23 transferred to [facility] s/p getting intubated for AMS and hypercarbia. Surgical service consulted at [facility] on arrival . Tracheal aspirate grew MRSA and she completed 10d of vanc along with other antibiotics 7/24 on CCB gtt for af. But fully awake and following commands. Extubated. 7/25 SLP evaluation for diet, ASA started, FEES 7/28 developed worsening abdominal pain, leukocytosis, CT showing complete SMA occlusion 7/29 Underwent aorto to SMA bypass with greater saphenous vein graft and small bowel resection 7/29 Developed hypotension to the 50s in post-op period, started on pressors 7/29  Code status changed to DNR 7/30  Ex Lap by [facility] w/ necrotic bowel & ischemia s/p resection w/ intact bypass & oozing over peritoneal surfaces. 8/1 OR, limited small bowel resection, anastamosis x2, vascular graft intact 8/3 neo needed again 8/4 - belly closed, Tracheostomy placed, low UOP 04/26/16 - start trickle feeds    Over the next several days she remained critically ill and on vent with TNA infusion and dressing changes and supportive care from surgery services. On 04/28/16 [facility] noticed rising white count (leukocytosis) and liver function test. MRCP was ordered. On 04/29/16 she was more obtunded even without sedation and CT head was ordered. The CT head and MRCP showed evolving stroke and thromboembolic phenomenon. Discussion with family held and comfort care approach adopted. Patient was terminally weaned and she expired 05-17-16     SIGNED Dr. Brand Males, M.D., [facility].C.P Pulmonary and Critical Care Medicine Staff Physician [place] [facility] Pager: [phone], If no answer or between  15:00h - 7:00h: call [phone]  06/07/2016 2:01 PM

## 2018-01-19 IMAGING — CR DG CHEST 1V PORT
1 series · 1 of 1 positions shown · non-contrast
Comparison: Chest radiograph 04/09/2016.

CLINICAL DATA: Patient with acute respiratory failure.

EXAM:
PORTABLE CHEST 1 VIEW

[AP]
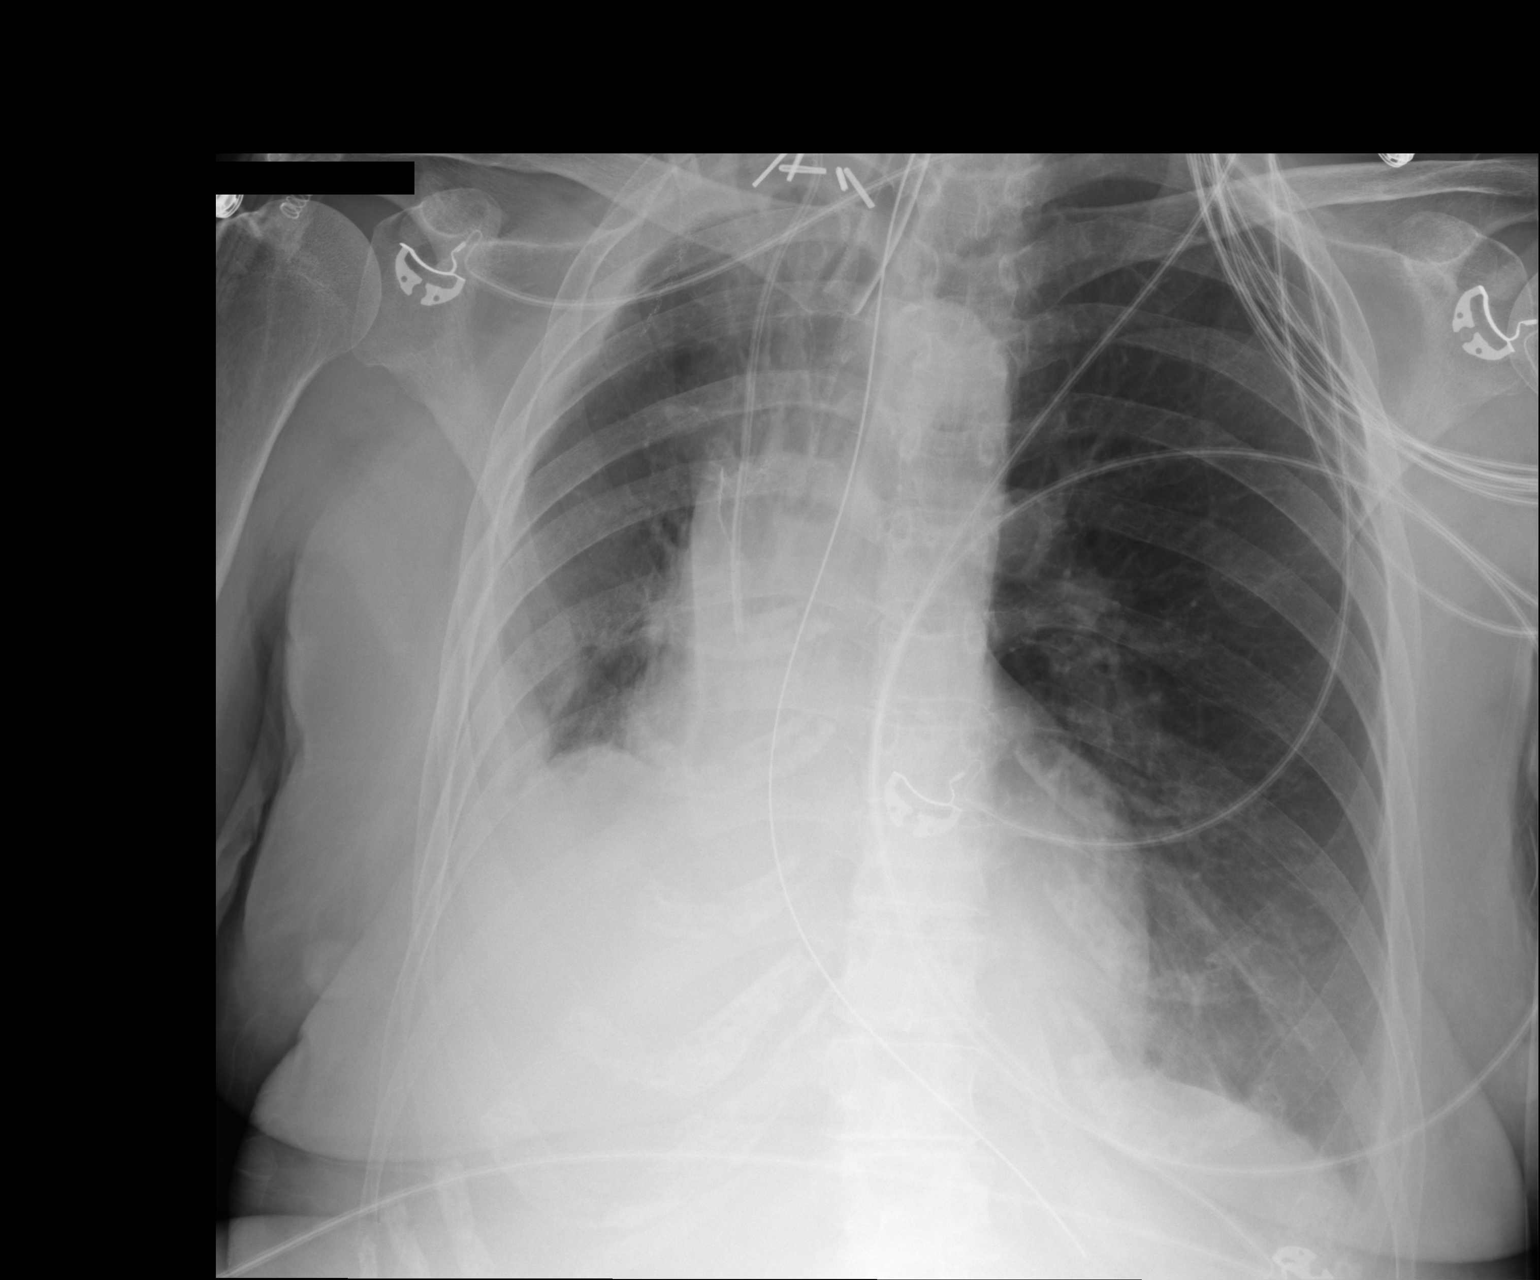

[1 of 1 positions shown; findings below may reference images not displayed]

FINDINGS: ET tube terminates in the mid trachea. Right IJ central venous
catheter tip projects over the superior vena cava. Enteric tube
courses inferior to the diaphragm. Stable cardiac and mediastinal
contours. Rightward shift of mediastinum. Moderate right pleural
effusion with pulmonary consolidation within the right mid and lower
hemi thorax. Left lung is clear. Monitoring leads overlie the
patient.
IMPRESSION: Persistent moderate right pleural effusion and underlying opacities
favored to represent atelectasis.

ET tube terminates in mid trachea.

## 2018-01-20 IMAGING — MR MR HEAD WO/W CM
9 of 12 series · 33 of 48 positions shown · IV contrast (multihance)
Comparison: Prior CT from 04/08/2016.

CLINICAL DATA: Initial evaluation for acute weakness postop day 5
status post colostomy reversal.

EXAM:
MRI HEAD WITHOUT AND WITH CONTRAST
TECHNIQUE: Multiplanar, multiecho pulse sequences of the brain and surrounding
structures were obtained without and with intravenous contrast.
CONTRAST:  12mL MULTIHANCE GADOBENATE DIMEGLUMINE 529 MG/ML IV SOLN

[Series 3: T1 · sagittal · 5.0mm · 0.47mm/px · 3 of 25 slices shown]
[im 1/25]
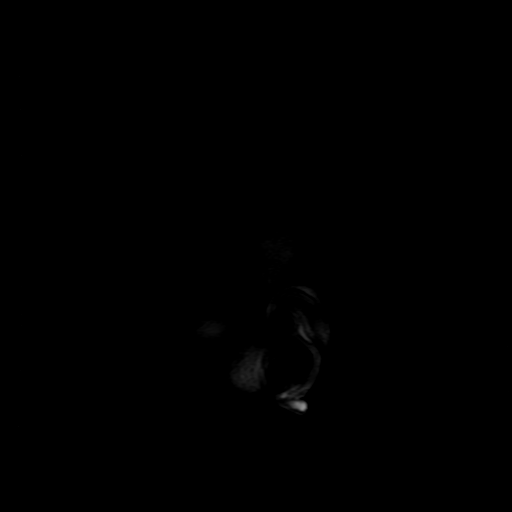
[im 13/25]
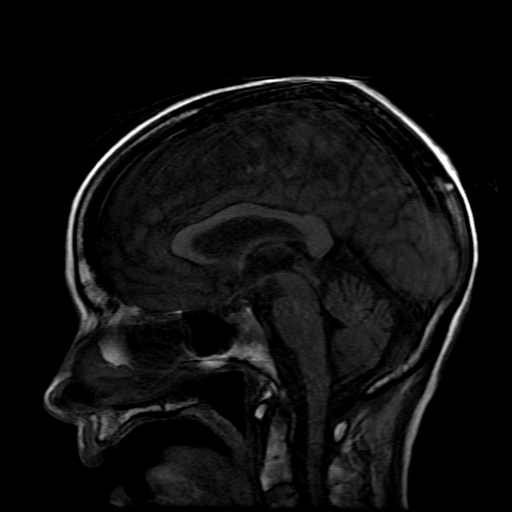
[im 25/25]
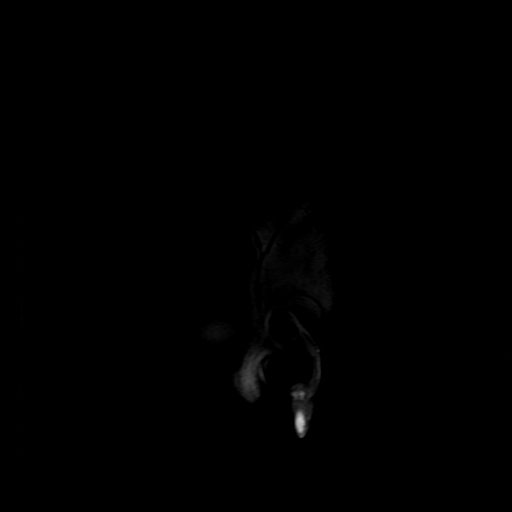

[Series 4: DWI · axial · 3.0mm · 1.09mm/px · z∈[-68,+87]mm · 9 of 106 slices shown (1 of 4)]
[im 1/106]
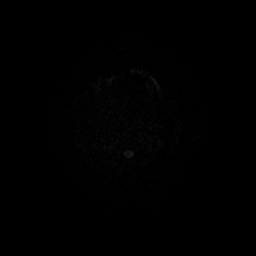
[im 14/106]
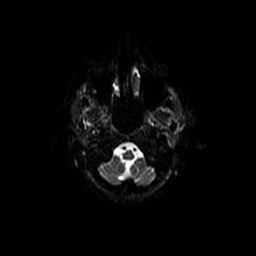
[im 27/106]
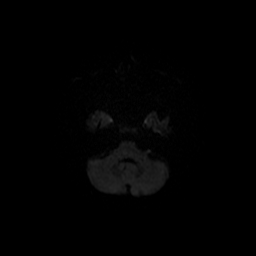
[im 40/106]
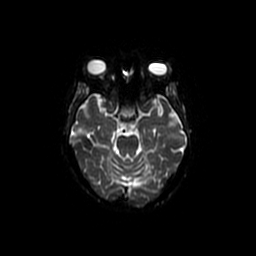
[im 53/106]
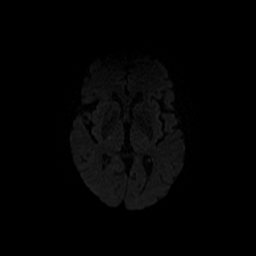
[im 66/106]
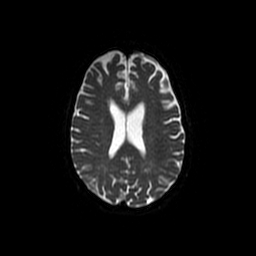
[im 79/106]
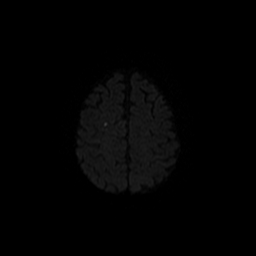
[im 92/106]
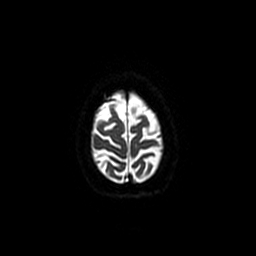
[im 106/106]
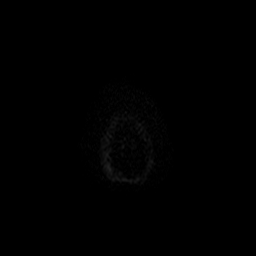

[Series 5: DWI · coronal · 5.0mm · 1.09mm/px · 6 of 70 slices shown (2 of 4)]
[im 1/70]
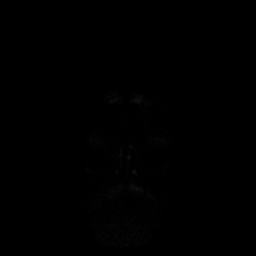
[im 14/70]
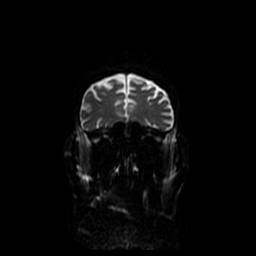
[im 28/70]
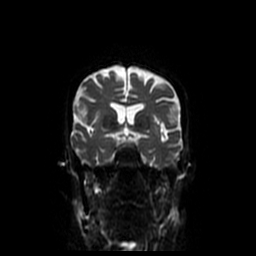
[im 42/70]
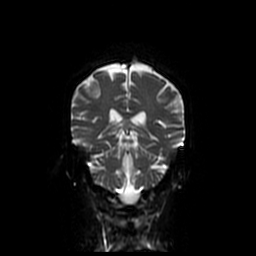
[im 56/70]
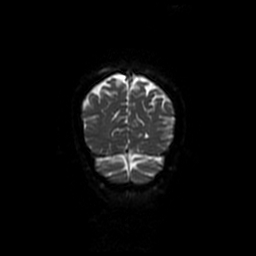
[im 70/70]
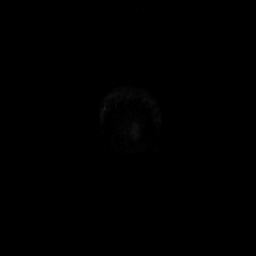

[Series 6: T2 · axial · 5.0mm · 0.43mm/px · z∈[-66,+90]mm · 2 of 27 slices shown (1 of 2)]
[im 1/27]
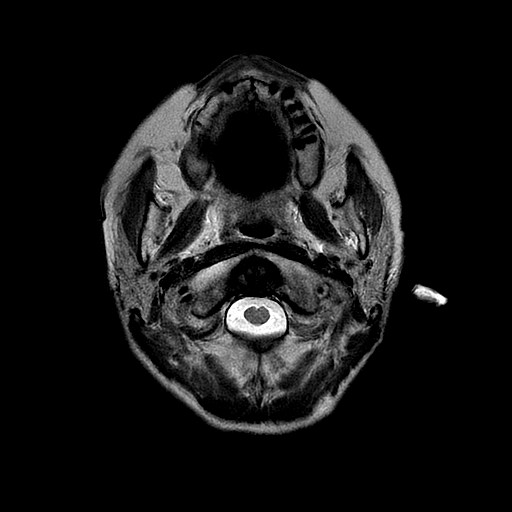
[im 27/27]
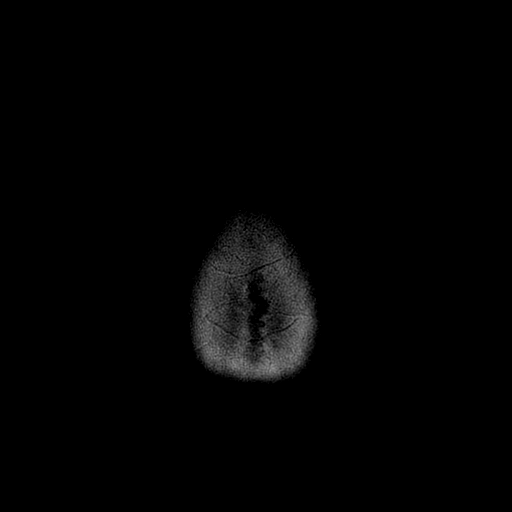

[Series 7: FLAIR · axial · 5.0mm · 0.43mm/px · z∈[-66,+90]mm · 2 of 27 slices shown]
[im 1/27]
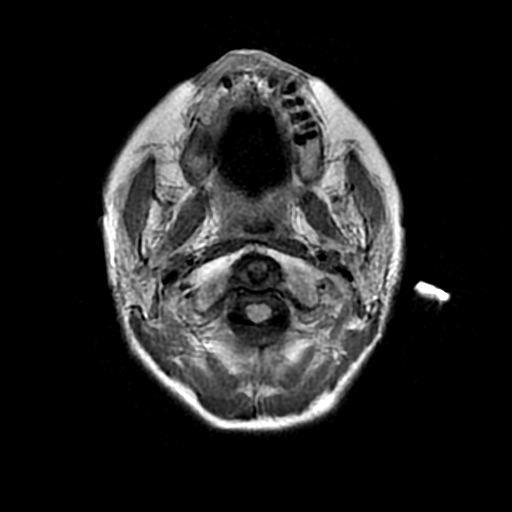
[im 27/27]
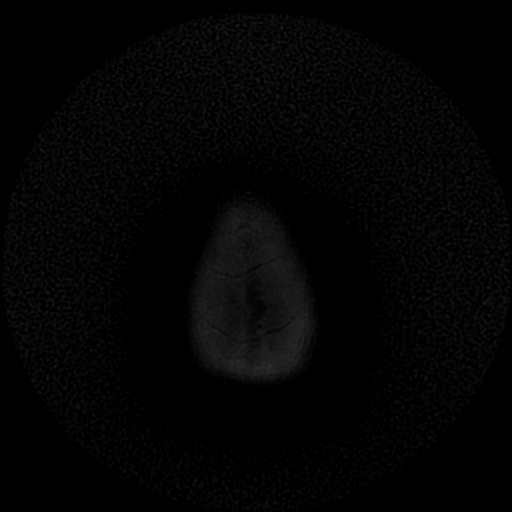

[Series 15: T1 post-contrast · coronal · 5.0mm · 0.43mm/px · 2 of 30 slices shown]
[im 1/30]
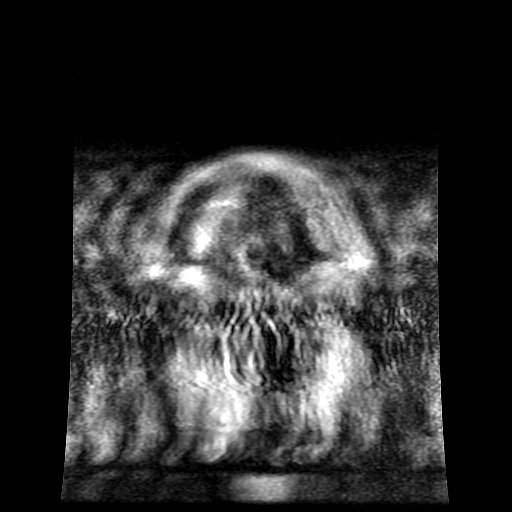
[im 30/30]
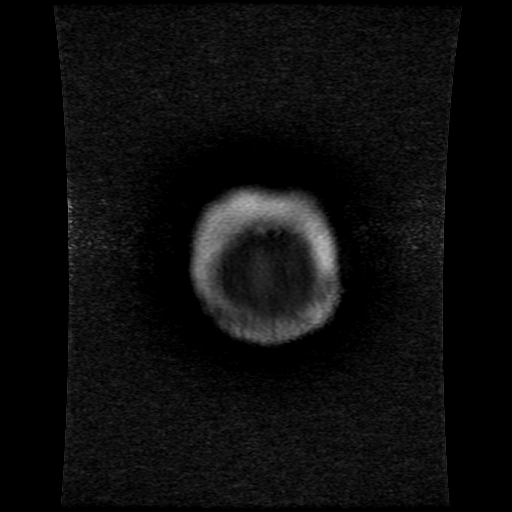

[Series 16: T2 · coronal · 5.0mm · 0.43mm/px · 2 of 30 slices shown (2 of 2)]
[im 1/30]
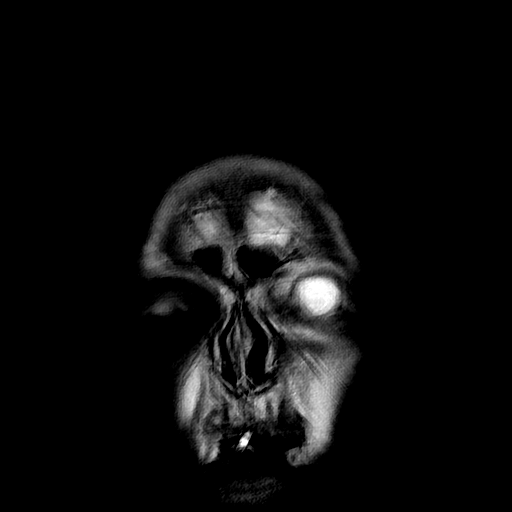
[im 30/30]
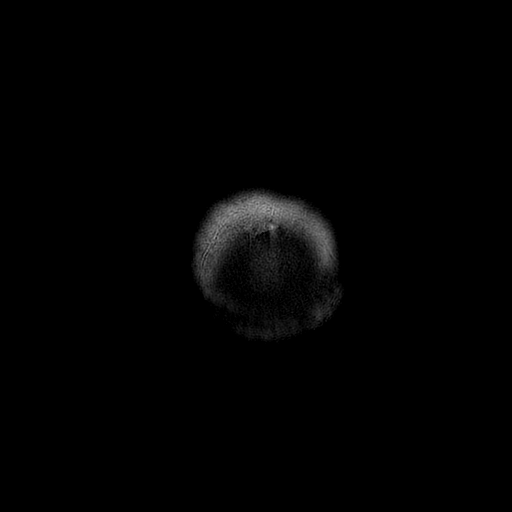

[Series 400: DWI · axial · 3.0mm · 1.09mm/px · z∈[-68,+87]mm · 4 of 53 slices shown (3 of 4)]
[im 1/53]
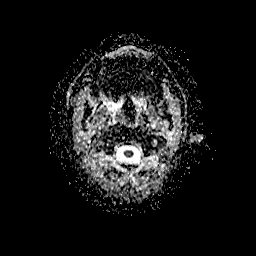
[im 18/53]
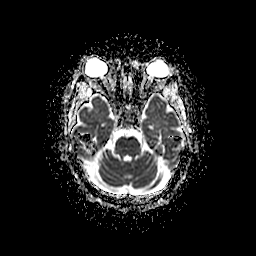
[im 35/53]
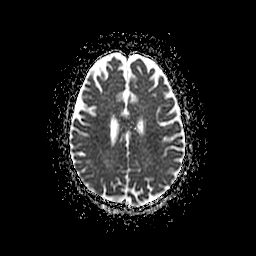
[im 53/53]
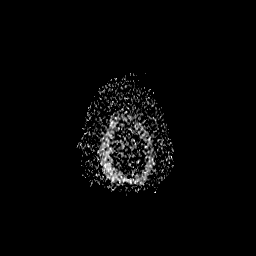

[Series 500: DWI · coronal · 5.0mm · 1.09mm/px · 3 of 35 slices shown (4 of 4)]
[im 1/35]
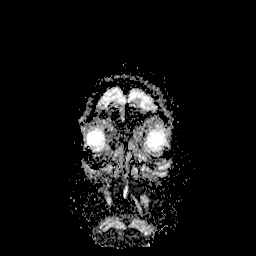
[im 18/35]
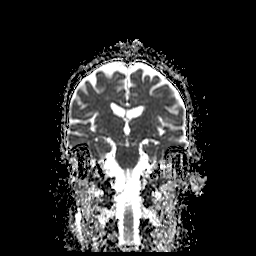
[im 35/35]
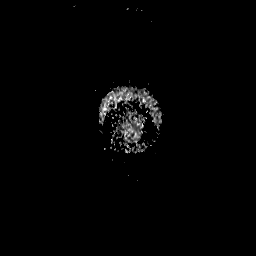

[33 of 48 positions shown; findings below may reference images not displayed]

FINDINGS: Study mildly degraded by motion artifact.

Cerebral volume within normal limits for patient age. Minimal
T2/FLAIR hyperintensity within the periventricular white matter,
likely related to chronic small vessel ischemic changes, within
normal limits for patient age.

Patchy multi focal bowl acute ischemic infarcts are seen involving
the bilateral cerebral hemispheres. Specifically, there is patchy
restricted diffusion involving the cortical gray matter of the
anterior left frontal lobe (series 4, image 32). Few sub cm foci of
restricted diffusion within the anterior/mid right centrum semi
ovale (series 4, image 41, 40). Additional patchy cortical infarct
within the right occipital pole (series 4, image 30). A small
subcentimeter infarct within inferior right cerebellar hemisphere
(series 4, image 11). More faint diffusion abnormality within the
cortical gray matter and subcortical white matter of the right
parietal lobe (series 4, image 44, 35) is likely more subacute in
nature. No associated mass effect or hemorrhage. Major intracranial
vascular flow voids are preserved.

No mass lesion, mass effect, or midline shift. No hydrocephalus. No
extra-axial fluid collection. Major dural sinuses are grossly
patent. No abnormal enhancement, although evaluation somewhat
limited due to motion artifact on this exam.

Craniocervical junction within normal limits. Visualized upper
cervical spine unremarkable.

Pituitary gland normal. No acute abnormality about the globes and
orbits. Patient is status post lens extraction bilaterally.

Mild mucosal thickening within the ethmoidal air cells and maxillary
sinuses. Paranasal sinuses are otherwise clear. Small bilateral
mastoid effusions noted. Inner ear structures grossly normal.

Bone marrow signal intensity within normal limits. No scalp soft
tissue abnormality.
IMPRESSION: 1. Patchy acute and early subacute ischemic infarcts involving the
bilateral cerebral hemispheres and right cerebellar hemisphere as
above. No associated mass effect or hemorrhage. Given the various
vascular distributions involved, a central thromboembolic source is
suspected.
2. No other acute intracranial process identified.
